# Patient Record
Sex: Male | Born: 1935 | Race: White | Hispanic: No | Marital: Married | Smoking: Former smoker
Health system: Southern US, Community
[De-identification: ages and names within clinical notes are randomized; demographics above are authoritative.]

## PROBLEM LIST (undated history)

## (undated) DIAGNOSIS — D649 Anemia, unspecified: Secondary | ICD-10-CM

## (undated) DIAGNOSIS — K8689 Other specified diseases of pancreas: Secondary | ICD-10-CM

## (undated) DIAGNOSIS — M1990 Unspecified osteoarthritis, unspecified site: Secondary | ICD-10-CM

## (undated) DIAGNOSIS — M48061 Spinal stenosis, lumbar region without neurogenic claudication: Secondary | ICD-10-CM

## (undated) DIAGNOSIS — R6510 Systemic inflammatory response syndrome (SIRS) of non-infectious origin without acute organ dysfunction: Secondary | ICD-10-CM

## (undated) DIAGNOSIS — E785 Hyperlipidemia, unspecified: Secondary | ICD-10-CM

## (undated) DIAGNOSIS — M109 Gout, unspecified: Secondary | ICD-10-CM

## (undated) DIAGNOSIS — R2689 Other abnormalities of gait and mobility: Secondary | ICD-10-CM

## (undated) DIAGNOSIS — M790 Rheumatism, unspecified: Secondary | ICD-10-CM

## (undated) DIAGNOSIS — E039 Hypothyroidism, unspecified: Secondary | ICD-10-CM

## (undated) DIAGNOSIS — Z741 Need for assistance with personal care: Secondary | ICD-10-CM

## (undated) DIAGNOSIS — K5900 Constipation, unspecified: Secondary | ICD-10-CM

## (undated) DIAGNOSIS — I10 Essential (primary) hypertension: Secondary | ICD-10-CM

## (undated) DIAGNOSIS — N200 Calculus of kidney: Secondary | ICD-10-CM

## (undated) DIAGNOSIS — H9190 Unspecified hearing loss, unspecified ear: Secondary | ICD-10-CM

## (undated) DIAGNOSIS — I4891 Unspecified atrial fibrillation: Secondary | ICD-10-CM

## (undated) DIAGNOSIS — K56609 Unspecified intestinal obstruction, unspecified as to partial versus complete obstruction: Secondary | ICD-10-CM

## (undated) HISTORY — DX: Hyperlipidemia, unspecified: E78.5

## (undated) HISTORY — PX: TRANSURETHRAL RESECTION OF PROSTATE: SHX73

## (undated) HISTORY — DX: Unspecified intestinal obstruction, unspecified as to partial versus complete obstruction: K56.609

## (undated) HISTORY — PX: CYSTOSCOPY W/ STONE MANIPULATION: SHX1427

## (undated) HISTORY — PX: NASAL FRACTURE SURGERY: SHX718

## (undated) HISTORY — DX: Anemia, unspecified: D64.9

---

## 1975-12-10 ENCOUNTER — Encounter: Payer: Self-pay | Admitting: Internal Medicine

## 1986-09-26 ENCOUNTER — Encounter: Payer: Self-pay | Admitting: Internal Medicine

## 1987-09-19 ENCOUNTER — Encounter: Payer: Self-pay | Admitting: Internal Medicine

## 1988-08-30 ENCOUNTER — Encounter: Payer: Self-pay | Admitting: Internal Medicine

## 1992-08-26 ENCOUNTER — Encounter: Payer: Self-pay | Admitting: Internal Medicine

## 1993-08-03 ENCOUNTER — Encounter: Payer: Self-pay | Admitting: Internal Medicine

## 1998-06-12 ENCOUNTER — Encounter: Payer: Self-pay | Admitting: Internal Medicine

## 1998-09-02 ENCOUNTER — Inpatient Hospital Stay (HOSPITAL_COMMUNITY): Admission: EM | Admit: 1998-09-02 | Discharge: 1998-09-08 | Payer: Self-pay | Admitting: Emergency Medicine

## 1998-09-02 ENCOUNTER — Encounter: Payer: Self-pay | Admitting: Emergency Medicine

## 1998-09-04 ENCOUNTER — Encounter: Payer: Self-pay | Admitting: Family Medicine

## 1998-09-10 ENCOUNTER — Encounter: Admission: RE | Admit: 1998-09-10 | Discharge: 1998-09-10 | Payer: Self-pay | Admitting: Family Medicine

## 2001-07-07 ENCOUNTER — Inpatient Hospital Stay (HOSPITAL_COMMUNITY): Admission: EM | Admit: 2001-07-07 | Discharge: 2001-07-08 | Payer: Self-pay | Admitting: Emergency Medicine

## 2001-07-07 ENCOUNTER — Encounter: Payer: Self-pay | Admitting: Emergency Medicine

## 2001-07-24 ENCOUNTER — Encounter: Payer: Self-pay | Admitting: Cardiology

## 2001-07-24 ENCOUNTER — Ambulatory Visit (HOSPITAL_COMMUNITY): Admission: RE | Admit: 2001-07-24 | Discharge: 2001-07-24 | Payer: Self-pay | Admitting: Cardiology

## 2001-10-03 ENCOUNTER — Encounter: Payer: Self-pay | Admitting: Internal Medicine

## 2002-08-26 ENCOUNTER — Emergency Department (HOSPITAL_COMMUNITY): Admission: EM | Admit: 2002-08-26 | Discharge: 2002-08-26 | Payer: Self-pay

## 2003-04-05 ENCOUNTER — Encounter: Admission: RE | Admit: 2003-04-05 | Discharge: 2003-04-05 | Payer: Self-pay | Admitting: Rheumatology

## 2003-04-05 ENCOUNTER — Encounter: Payer: Self-pay | Admitting: Rheumatology

## 2003-04-15 ENCOUNTER — Encounter: Payer: Self-pay | Admitting: Rheumatology

## 2003-04-15 ENCOUNTER — Encounter: Admission: RE | Admit: 2003-04-15 | Discharge: 2003-04-15 | Payer: Self-pay | Admitting: Rheumatology

## 2003-06-03 ENCOUNTER — Encounter: Payer: Self-pay | Admitting: Family Medicine

## 2003-06-03 ENCOUNTER — Encounter: Admission: RE | Admit: 2003-06-03 | Discharge: 2003-06-03 | Payer: Self-pay | Admitting: Family Medicine

## 2004-08-27 ENCOUNTER — Ambulatory Visit: Payer: Self-pay | Admitting: Family Medicine

## 2005-01-19 ENCOUNTER — Ambulatory Visit: Payer: Self-pay | Admitting: Family Medicine

## 2005-04-27 ENCOUNTER — Ambulatory Visit: Payer: Self-pay | Admitting: Family Medicine

## 2005-08-31 ENCOUNTER — Ambulatory Visit: Payer: Self-pay | Admitting: Family Medicine

## 2005-12-30 ENCOUNTER — Ambulatory Visit: Payer: Self-pay | Admitting: Family Medicine

## 2006-04-28 ENCOUNTER — Ambulatory Visit: Payer: Self-pay | Admitting: Family Medicine

## 2006-06-26 ENCOUNTER — Emergency Department (HOSPITAL_COMMUNITY): Admission: EM | Admit: 2006-06-26 | Discharge: 2006-06-26 | Payer: Self-pay | Admitting: Emergency Medicine

## 2006-09-05 ENCOUNTER — Ambulatory Visit: Payer: Self-pay | Admitting: Family Medicine

## 2008-12-02 ENCOUNTER — Encounter: Payer: Self-pay | Admitting: Internal Medicine

## 2009-03-18 ENCOUNTER — Ambulatory Visit: Payer: Self-pay | Admitting: Internal Medicine

## 2009-03-18 DIAGNOSIS — R198 Other specified symptoms and signs involving the digestive system and abdomen: Secondary | ICD-10-CM

## 2009-03-18 DIAGNOSIS — K5900 Constipation, unspecified: Secondary | ICD-10-CM | POA: Insufficient documentation

## 2009-03-18 DIAGNOSIS — E039 Hypothyroidism, unspecified: Secondary | ICD-10-CM

## 2009-03-19 LAB — CONVERTED CEMR LAB
Basophils Absolute: 0 10*3/uL (ref 0.0–0.1)
Basophils Relative: 0.6 % (ref 0.0–3.0)
Eosinophils Absolute: 0.1 10*3/uL (ref 0.0–0.7)
HCT: 37 % — ABNORMAL LOW (ref 39.0–52.0)
Hemoglobin: 12.8 g/dL — ABNORMAL LOW (ref 13.0–17.0)
Lymphocytes Relative: 24 % (ref 12.0–46.0)
Lymphs Abs: 1.3 10*3/uL (ref 0.7–4.0)
MCHC: 34.5 g/dL (ref 30.0–36.0)
MCV: 97.3 fL (ref 78.0–100.0)
Monocytes Absolute: 0.5 10*3/uL (ref 0.1–1.0)
Neutro Abs: 3.4 10*3/uL (ref 1.4–7.7)
RDW: 12.5 % (ref 11.5–14.6)

## 2009-04-13 ENCOUNTER — Emergency Department (HOSPITAL_COMMUNITY): Admission: EM | Admit: 2009-04-13 | Discharge: 2009-04-14 | Payer: Self-pay | Admitting: Emergency Medicine

## 2009-05-19 ENCOUNTER — Ambulatory Visit: Payer: Self-pay | Admitting: Internal Medicine

## 2009-05-19 DIAGNOSIS — D649 Anemia, unspecified: Secondary | ICD-10-CM | POA: Insufficient documentation

## 2009-07-21 ENCOUNTER — Emergency Department (HOSPITAL_COMMUNITY): Admission: EM | Admit: 2009-07-21 | Discharge: 2009-07-21 | Payer: Self-pay | Admitting: Emergency Medicine

## 2010-12-23 LAB — POCT CARDIAC MARKERS
CKMB, poc: 3.7 ng/mL (ref 1.0–8.0)
CKMB, poc: 6 ng/mL (ref 1.0–8.0)
Troponin i, poc: <0.05 ng/mL (ref 0.00–0.09)
Troponin i, poc: <0.05 ng/mL (ref 0.00–0.09)

## 2010-12-23 LAB — URINALYSIS, ROUTINE W REFLEX MICROSCOPIC
Bilirubin Urine: NEGATIVE
Glucose, UA: NEGATIVE mg/dL
Hgb urine dipstick: NEGATIVE
Protein, ur: NEGATIVE mg/dL
pH: 7.5 (ref 5.0–8.0)

## 2010-12-23 LAB — COMPREHENSIVE METABOLIC PANEL
ALT: 11 U/L (ref 0–53)
AST: 16 U/L (ref 0–37)
Calcium: 9.2 mg/dL (ref 8.4–10.5)
Creatinine, Ser: 1.05 mg/dL (ref 0.4–1.5)
GFR calc Af Amer: >60 mL/min (ref >60)
Sodium: 133 mEq/L — ABNORMAL LOW (ref 135–145)
Total Protein: 7 g/dL (ref 6.0–8.3)

## 2010-12-23 LAB — DIFFERENTIAL
Eosinophils Absolute: 0.1 10*3/uL (ref 0.0–0.7)
Eosinophils Relative: 1 % (ref 0–5)
Lymphocytes Relative: 17 % (ref 12–46)
Lymphs Abs: 1.1 10*3/uL (ref 0.7–4.0)
Monocytes Relative: 6 % (ref 3–12)

## 2010-12-23 LAB — CBC
MCHC: 34.9 g/dL (ref 30.0–36.0)
MCV: 95.4 fL (ref 78.0–100.0)
Platelets: 229 10*3/uL (ref 150–400)
RDW: 13 % (ref 11.5–15.5)

## 2010-12-23 LAB — LIPASE, BLOOD: Lipase: 26 U/L (ref 11–59)

## 2010-12-27 LAB — DIFFERENTIAL
Basophils Relative: 0 % (ref 0–1)
Eosinophils Absolute: 0.1 10*3/uL (ref 0.0–0.7)
Eosinophils Relative: 2 % (ref 0–5)
Lymphs Abs: 1.5 10*3/uL (ref 0.7–4.0)
Monocytes Absolute: 0.4 10*3/uL (ref 0.1–1.0)
Monocytes Relative: 7 % (ref 3–12)
Neutrophils Relative %: 63 % (ref 43–77)

## 2010-12-27 LAB — URINALYSIS, ROUTINE W REFLEX MICROSCOPIC
Bilirubin Urine: NEGATIVE
Glucose, UA: NEGATIVE mg/dL
Hgb urine dipstick: NEGATIVE
Protein, ur: NEGATIVE mg/dL

## 2010-12-27 LAB — CBC
HCT: 38.1 % — ABNORMAL LOW (ref 39.0–52.0)
Hemoglobin: 13.1 g/dL (ref 13.0–17.0)
MCHC: 34.4 g/dL (ref 30.0–36.0)
MCV: 97.4 fL (ref 78.0–100.0)
RBC: 3.91 MIL/uL — ABNORMAL LOW (ref 4.22–5.81)
WBC: 5.5 10*3/uL (ref 4.0–10.5)

## 2010-12-27 LAB — BASIC METABOLIC PANEL
CO2: 21 mEq/L (ref 19–32)
Chloride: 106 mEq/L (ref 96–112)
GFR calc Af Amer: >60 mL/min (ref >60)
Potassium: 3.9 mEq/L (ref 3.5–5.1)

## 2011-02-05 NOTE — Procedures (Signed)
[facility]  Patient:    Mario May, Mario May Visit Number: [id number] MRN: [id number]          Service Type: MED Location: [phone] Attending Physician:  Junious Silk Dictated by:   Winn Jock Smitty Cords, M.D. Proc. Date: 07/24/01 Admit Date:  07/07/2001 Discharge Date: 07/08/2001                             Procedure Report  PROCEDURE:  Adenosine stress nuclear cardiogram.  CARDIOLOGIST:  Winn Jock. Smitty Cords, M.D.  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  He has had several stress procedures in the past including an RNA, which was negative for ischemia.  He has had borderline mild hypertension.  His chest pain has been described as sometimes substernal, occasionally radiating into the left chest.  His last admission was an emergency room evaluation at [facility], at which time enzymes were negative, EKG was normal and he was discharged home.  PAST MEDICAL HISTORY:  Bilateral lower extremity pain, history of gout and otherwise mild arthritic symptoms only.  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  PHYSICAL EXAMINATION:  VITAL SIGNS:  On examination, his blood pressure at rest is 140/60, pulse 85, regular, weight 195 pounds and respirations 18. EENT/NECK:  No bruit.  Neck pulse is good.  CHEST:  Negative.  LUNGS:  Clear. HEART:  No gallop or murmur.  ABDOMEN:  Slightly obese, nontender. EXTREMITIES:  Good peripheral pulses.  NEUROLOGICAL:  Negative.  DESCRIPTION OF PROCEDURE:  Procedure was one of adenosine injection of the usual protocol with immediate flushing/anxiety for which required some reassurance.  His EKG did not show any changes.  He had rare PVC.   Transient mild tachycardia of about 110.  Following the procedure, he was asymptomatic. The 12-lead EKG was negative.  The nuclear scan is currently being processed and will be reported separately.  IMPRESSION:  Chest pain, rule out angina.  Adenosine injection complicated by flushing, anxiety and mild tachycardia, with no other complications. Dictated by:   Winn Jock Smitty Cords, M.D. Attending Physician:  Junious Silk DD:  07/24/01 TD:  07/25/01 Job: [id number]

## 2011-02-05 NOTE — Discharge Summary (Signed)
[place]  Patient:    Mario May, Mario May Visit Number: [id number] MRN: [id number]          Service Type: MED Location: [phone] Attending Physician:  Junious Silk Dictated by:   Dian Queen, P.A.C. [facility] Admit Date:  07/07/2001 Discharge Date: 07/08/2001   CC:         Dr. Fayrene Fearing C. Bruce  Dr. Lysbeth Galas in [place] at [facility]   Referring Physician Discharge [place]  HISTORY OF PRESENT ILLNESS:  Mario May is a very pleasant 77 year old white male with hypertension, hyperlipoproteinemia, history of hyperuricemia, renal stones, prior TURP who presented with atypical left precordial chest pain exacerbated by movement.  His chest was sore to the touch.  He had multiple risk factors and we felt we ought to watch him overnight, but felt this was related to musculoskeletal strain incurred when he was carrying around a pesticide container a day or so prior to this.  COURSE IN THE HOSPITAL:  Patient ruled out for myocardial infarction.  Lab work was unremarkable, electrolytes and renal function totally normal. Hemoglobin 14.1 with a hematocrit of 41.1 and a white count of 4700.  CKs were low with negative MBs and troponins were low.  Patient was watched overnight.  He had no further chest pain.  We began him on nonsteroidals.  He ruled out, had no serial evolutionary changes.  We thought it was safe to send him home with a plan to get an outpatient Cardiolite.  FINAL DIAGNOSES: 1. Chest pain - probably musculoskeletal - infarct ruled out. 2. Hyperlipoproteinemia - on Lipitor. 3. Controlled hypertension.  DISPOSITION:  Patient discharged home on same medications plus Motrin for two days.  We will be in contact with Dr. Zigmund Gottron office and suggest perhaps the arrangement of an outpatient Cardiolite but defer this ultimate decision to him. Dictated by:   Dian Queen, P.A.C. LHC Attending Physician:  Junious Silk DD:  07/08/01 TD:  07/08/01 Job: [id number]

## 2013-02-18 DIAGNOSIS — R6510 Systemic inflammatory response syndrome (SIRS) of non-infectious origin without acute organ dysfunction: Secondary | ICD-10-CM

## 2013-02-18 HISTORY — DX: Systemic inflammatory response syndrome (sirs) of non-infectious origin without acute organ dysfunction: R65.10

## 2013-03-14 ENCOUNTER — Emergency Department (HOSPITAL_COMMUNITY): Payer: Medicare PPO

## 2013-03-14 ENCOUNTER — Encounter (HOSPITAL_COMMUNITY): Payer: Self-pay | Admitting: Radiology

## 2013-03-14 ENCOUNTER — Inpatient Hospital Stay (HOSPITAL_COMMUNITY)
Admission: EM | Admit: 2013-03-14 | Discharge: 2013-03-19 | DRG: 872 | Disposition: A | Discharge status: Home Health | Payer: Medicare PPO | Attending: Family Medicine | Admitting: Family Medicine

## 2013-03-14 DIAGNOSIS — I441 Atrioventricular block, second degree: Secondary | ICD-10-CM

## 2013-03-14 DIAGNOSIS — M109 Gout, unspecified: Secondary | ICD-10-CM | POA: Diagnosis present

## 2013-03-14 DIAGNOSIS — I10 Essential (primary) hypertension: Secondary | ICD-10-CM | POA: Diagnosis present

## 2013-03-14 DIAGNOSIS — A412 Sepsis due to unspecified staphylococcus: Principal | ICD-10-CM | POA: Diagnosis present

## 2013-03-14 DIAGNOSIS — R6510 Systemic inflammatory response syndrome (SIRS) of non-infectious origin without acute organ dysfunction: Secondary | ICD-10-CM | POA: Diagnosis present

## 2013-03-14 DIAGNOSIS — I440 Atrioventricular block, first degree: Secondary | ICD-10-CM | POA: Diagnosis present

## 2013-03-14 DIAGNOSIS — R001 Bradycardia, unspecified: Secondary | ICD-10-CM

## 2013-03-14 DIAGNOSIS — N390 Urinary tract infection, site not specified: Secondary | ICD-10-CM | POA: Diagnosis present

## 2013-03-14 DIAGNOSIS — M1990 Unspecified osteoarthritis, unspecified site: Secondary | ICD-10-CM | POA: Diagnosis present

## 2013-03-14 DIAGNOSIS — Z79899 Other long term (current) drug therapy: Secondary | ICD-10-CM

## 2013-03-14 DIAGNOSIS — E039 Hypothyroidism, unspecified: Secondary | ICD-10-CM

## 2013-03-14 DIAGNOSIS — M5430 Sciatica, unspecified side: Secondary | ICD-10-CM | POA: Diagnosis present

## 2013-03-14 DIAGNOSIS — Z9181 History of falling: Secondary | ICD-10-CM

## 2013-03-14 DIAGNOSIS — A419 Sepsis, unspecified organism: Secondary | ICD-10-CM | POA: Diagnosis present

## 2013-03-14 DIAGNOSIS — Z7982 Long term (current) use of aspirin: Secondary | ICD-10-CM

## 2013-03-14 HISTORY — DX: Essential (primary) hypertension: I10

## 2013-03-14 HISTORY — DX: Hypothyroidism, unspecified: E03.9

## 2013-03-14 HISTORY — DX: Gout, unspecified: M10.9

## 2013-03-14 HISTORY — DX: Constipation, unspecified: K59.00

## 2013-03-14 LAB — CBC
HCT: 40.8 % (ref 39.0–52.0)
MCH: 32 pg (ref 26.0–34.0)
MCV: 96 fL (ref 78.0–100.0)
RDW: 13.2 % (ref 11.5–15.5)
WBC: 6.5 10*3/uL (ref 4.0–10.5)

## 2013-03-14 LAB — URINALYSIS, ROUTINE W REFLEX MICROSCOPIC
Bilirubin Urine: NEGATIVE
Glucose, UA: NEGATIVE mg/dL
Ketones, ur: NEGATIVE mg/dL
Leukocytes, UA: NEGATIVE
Nitrite: NEGATIVE
Protein, ur: 100 mg/dL — AB
Specific Gravity, Urine: 1.02 (ref 1.005–1.030)
Urobilinogen, UA: 1 mg/dL (ref 0.0–1.0)
pH: 5.5 (ref 5.0–8.0)

## 2013-03-14 LAB — COMPREHENSIVE METABOLIC PANEL
Albumin: 3.5 g/dL (ref 3.5–5.2)
BUN: 29 mg/dL — ABNORMAL HIGH (ref 6–23)
CO2: 23 mEq/L (ref 19–32)
Calcium: 9.1 mg/dL (ref 8.4–10.5)
Chloride: 100 mEq/L (ref 96–112)
Creatinine, Ser: 1.28 mg/dL (ref 0.50–1.35)
GFR calc non Af Amer: 46 mL/min — ABNORMAL LOW (ref >90)
Total Bilirubin: 0.7 mg/dL (ref 0.3–1.2)

## 2013-03-14 LAB — PROCALCITONIN: Procalcitonin: 0.28 ng/mL

## 2013-03-14 LAB — URINE MICROSCOPIC-ADD ON

## 2013-03-14 LAB — CG4 I-STAT (LACTIC ACID): Lactic Acid, Venous: 1.27 mmol/L (ref 0.5–2.2)

## 2013-03-14 MED ORDER — PIPERACILLIN-TAZOBACTAM 3.375 G IVPB
3.3750 g | Freq: Three times a day (TID) | INTRAVENOUS | Status: DC
  Administered 2013-03-15 – 2013-03-19 (×13): 3.375 g via INTRAVENOUS
  Filled 2013-03-14 (×15): qty 50

## 2013-03-14 MED ORDER — SODIUM CHLORIDE 0.9 % IV SOLN
1000.0000 mL | INTRAVENOUS | Status: DC
  Administered 2013-03-14: 1000 mL via INTRAVENOUS

## 2013-03-14 MED ORDER — VANCOMYCIN HCL IN DEXTROSE 1-5 GM/200ML-% IV SOLN: 1000.0000 mg | INTRAVENOUS | Status: DC

## 2013-03-14 MED ORDER — ACETAMINOPHEN 325 MG PO TABS
975.0000 mg | ORAL_TABLET | Freq: Once | ORAL | Status: AC
  Administered 2013-03-14: 975 mg via ORAL
  Filled 2013-03-14: qty 3

## 2013-03-14 MED ORDER — IOHEXOL 300 MG/ML  SOLN
75.0000 mL | Freq: Once | INTRAMUSCULAR | Status: AC | PRN
  Administered 2013-03-14: 75 mL via INTRAVENOUS

## 2013-03-14 MED ORDER — VANCOMYCIN HCL IN DEXTROSE 1-5 GM/200ML-% IV SOLN
1000.0000 mg | Freq: Once | INTRAVENOUS | Status: AC
  Administered 2013-03-14: 1000 mg via INTRAVENOUS
  Filled 2013-03-14: qty 200

## 2013-03-14 MED ORDER — PIPERACILLIN-TAZOBACTAM 3.375 G IVPB 30 MIN
3.3750 g | Freq: Once | INTRAVENOUS | Status: AC
  Administered 2013-03-14: 3.375 g via INTRAVENOUS
  Filled 2013-03-14: qty 50

## 2013-03-14 MED ORDER — VANCOMYCIN HCL 10 G IV SOLR
1250.0000 mg | INTRAVENOUS | Status: DC
  Administered 2013-03-15 – 2013-03-16 (×2): 1250 mg via INTRAVENOUS
  Filled 2013-03-14 (×3): qty 1250

## 2013-03-14 MED ORDER — SODIUM CHLORIDE 0.9 % IV SOLN
1000.0000 mL | Freq: Once | INTRAVENOUS | Status: AC
  Administered 2013-03-14: 1000 mL via INTRAVENOUS

## 2013-03-14 NOTE — ED Notes (Addendum)
Pt to ED via EMS for evaluation of weakness and N/V.  Pt was seen at [facility] today for similar symptoms and told he had a UTI, given prescription for antibiotic.  When pt got home he began to feel nauseas and dizzy- called EMS.  Pt complaining of generalized weakness and pain behind his left ear.  Alert and oriented upon arrival to ED.

## 2013-03-14 NOTE — ED Notes (Signed)
Pt returned from xray

## 2013-03-14 NOTE — Progress Notes (Signed)
ANTIBIOTIC CONSULT NOTE - INITIAL  Pharmacy Consult for Zosyn, Vancomycin Indication: rule out sepsis  Allergies  Allergen Reactions  . Codeine     Patient Measurements: Height: 5' 10.87" (180 cm) Weight: 186 lb 1.1 oz (84.4 kg) IBW/kg (Calculated) : 74.99 Adjusted Body Weight:   Vital Signs: Temp: 102.7 F (39.3 C) (06/25 2153) Temp src: Rectal (06/25 2153) BP: 118/69 mmHg (06/25 2243) Pulse Rate: 63 (06/25 2243) Intake/Output from previous day:   Intake/Output from this shift:    Labs:  Recent Labs  03/14/13 2012  WBC 6.5  HGB 13.6  PLT 167  CREATININE 1.28   Estimated Creatinine Clearance: 36.6 ml/min (by C-G formula based on Cr of 1.28). No results found for this basename: VANCOTROUGH, VANCOPEAK, VANCORANDOM, GENTTROUGH, GENTPEAK, GENTRANDOM, TOBRATROUGH, TOBRAPEAK, TOBRARND, AMIKACINPEAK, AMIKACINTROU, AMIKACIN,  in the last 72 hours   Microbiology: No results found for this or any previous visit (from the past 720 hour(s)).  Medical History: No past medical history on file.  Medications:  Scheduled:   Assessment: 79 yr old male who awoke this morning feeling weak and unable to walk. He was seen at [facility] and given Bactrim for a UTI. When pt got home, he became weak and nauseated so he called EMS to come [facility]. At this point he has no definite diagnosis.  Goal of Therapy:  Vancomycin trough level 15-20 mcg/ml  Plan:  Zosyn 3.375 GM IV q8h Vancomycin 1250 mg IV q12hrs.  Eugene Garnet 03/14/2013,10:50 PM

## 2013-03-14 NOTE — ED Notes (Signed)
Pt returned from CT °

## 2013-03-14 NOTE — ED Notes (Signed)
Pt remains in CT at this time.  

## 2013-03-14 NOTE — ED Provider Notes (Addendum)
History    CSN: [id number] Arrival date & time 03/14/13  1846  First MD Initiated Contact with Patient 03/14/13 1903     Chief Complaint  Patient presents with  . Weakness   (Consider location/radiation/quality/duration/timing/severity/associated sxs/prior Treatment) HPI  complains of generalized weakness onset this morning. Patient states was too weak to walk. He also complains of slight pain behind left ear and dysuria which is chronic. Seen by urgent [facility] this morning diagnosed with dysuria and muscle strain prescribed Bactrim. Presents here as weakness has increased. He denies cough denies chest pain denies other complaint No past medical history on file. No past surgical history on file. No family history on file. past medical history prostate hypertrophy History  Substance Use Topics  . Smoking status: Not on file  . Smokeless tobacco: Not on file  . Alcohol Use: Not on file   no tobacco no alcohol no drugs  Review of Systems  Constitutional: Negative.   HENT: Positive for hearing loss.        Chronically hard of hearing  Respiratory: Negative.   Cardiovascular: Negative.   Gastrointestinal: Negative.   Genitourinary: Positive for dysuria.  Musculoskeletal: Negative.   Skin: Negative.   Neurological: Negative.   Psychiatric/Behavioral: Negative.   All other systems reviewed and are negative.    Allergies  Codeine  Home Medications  No current outpatient prescriptions on file. BP 174/79  Pulse 78  Temp(Src) 103.2 F (39.6 C) (Rectal)  Resp 18  SpO2 99% Physical Exam  Nursing note and vitals reviewed. Constitutional: He appears well-developed and well-nourished. He appears distressed.  Moderately ill-appearing  HENT:  Head: Normocephalic and atraumatic.  Right Ear: External ear normal.  Left Ear: External ear normal.  Nose: Nose normal.  . Left ear, hearing A. removed, bilateral tympanic mucus membranes normal no redness or tenderness  of the mastoid process bilaterally  Eyes: Conjunctivae are normal. Pupils are equal, round, and reactive to light.  Neck: Neck supple. No tracheal deviation present. No thyromegaly present.  No signs of meningitis  Cardiovascular: Normal rate and regular rhythm.   No murmur heard. Pulmonary/Chest: Effort normal and breath sounds normal.  Abdominal: Soft. Bowel sounds are normal. He exhibits no distension. There is no tenderness.  Genitourinary:  External hemorrhoid. No perirectal abscess.  Musculoskeletal: Normal range of motion. He exhibits no edema and no tenderness.  Lymphadenopathy:    He has no cervical adenopathy.  Neurological: He is alert. No cranial nerve deficit. Coordination normal.  Skin: Skin is warm and dry. No rash noted.  Psychiatric: He has a normal mood and affect.   Results for orders placed during the hospital encounter of 03/14/13  CBC      Result Value Range   WBC 6.5  4.0 - 10.5 K/uL   RBC 4.25  4.22 - 5.81 MIL/uL   Hemoglobin 13.6  13.0 - 17.0 g/dL   HCT 75.6  43.3 - 29.5 %   MCV 96.0  78.0 - 100.0 fL   MCH 32.0  26.0 - 34.0 pg   MCHC 33.3  30.0 - 36.0 g/dL   RDW 18.8  41.6 - 60.6 %   Platelets 167  150 - 400 K/uL  COMPREHENSIVE METABOLIC PANEL      Result Value Range   Sodium 135  135 - 145 mEq/L   Potassium 4.0  3.5 - 5.1 mEq/L   Chloride 100  96 - 112 mEq/L   CO2 23  19 - 32 mEq/L  Glucose, Bld 117 (*) 70 - 99 mg/dL   BUN 29 (*) 6 - 23 mg/dL   Creatinine, Ser 1.61  0.50 - 1.35 mg/dL   Calcium 9.1  8.4 - 09.6 mg/dL   Total Protein 8.0  6.0 - 8.3 g/dL   Albumin 3.5  3.5 - 5.2 g/dL   AST 29  0 - 37 U/L   ALT 9  0 - 53 U/L   Alkaline Phosphatase 128 (*) 39 - 117 U/L   Total Bilirubin 0.7  0.3 - 1.2 mg/dL   GFR calc non Af Amer 46 (*) >90 mL/min   GFR calc Af Amer 53 (*) >90 mL/min  PROCALCITONIN      Result Value Range   Procalcitonin 0.28    URINALYSIS, ROUTINE W REFLEX MICROSCOPIC      Result Value Range   Color, Urine YELLOW  YELLOW    APPearance CLEAR  CLEAR   Specific Gravity, Urine 1.020  1.005 - 1.030   pH 5.5  5.0 - 8.0   Glucose, UA NEGATIVE  NEGATIVE mg/dL   Hgb urine dipstick SMALL (*) NEGATIVE   Bilirubin Urine NEGATIVE  NEGATIVE   Ketones, ur NEGATIVE  NEGATIVE mg/dL   Protein, ur 045 (*) NEGATIVE mg/dL   Urobilinogen, UA 1.0  0.0 - 1.0 mg/dL   Nitrite NEGATIVE  NEGATIVE   Leukocytes, UA NEGATIVE  NEGATIVE  URINE MICROSCOPIC-ADD ON      Result Value Range   Squamous Epithelial / LPF RARE  RARE   WBC, UA 0-2  <3 WBC/hpf   Urine-Other MUCOUS PRESENT    CG4 I-STAT (LACTIC ACID)      Result Value Range   Lactic Acid, Venous 1.27  0.5 - 2.2 mmol/L   Dg Chest 2 View  03/14/2013   *RADIOLOGY REPORT*  Clinical Data: Weakness  CHEST - 2 VIEW  Comparison: 07/21/2009  Findings: Mild cardiomegaly without CHF or pneumonia.  Minimal basilar atelectasis versus scarring.  No effusion or pneumothorax. Atherosclerotic calcification of the aorta.  Trachea is midline. Degenerative changes of the spine.  IMPRESSION: Cardiomegaly without CHF or pneumonia   Original Report Authenticated By: Judie Petit. Miles Costain, M.D.   Ct Soft Tissue Neck Wo Contrast  03/14/2013   *RADIOLOGY REPORT*  Clinical Data:  Pain behind left ear  CT NECK WITH CONTRAST  Technique:  Multidetector CT imaging of the neck was performed using the standard protocol following the bolus administration of intravenous contrast.  Contrast: 75mL OMNIPAQUE IOHEXOL 300 MG/ML  SOLN  Comparison:   None  Findings: Majority of the mastoid sinus is visualized and is clear bilaterally.  External auditory canal is patent.  No bony lesion in the skull base.  Negative for mass or adenopathy in the neck.  No evidence of abscess.  The tonsils are normal.  The tongue is normal. Epiglottis and larynx are normal.  Parotid and submandibular glands are normal bilaterally.  Cervical disc degeneration and facet degeneration diffusely.  No acute bony abnormality.  IMPRESSION: No acute abnormality.    Original Report Authenticated By: Janeece Riggers, M.D.    Chest xray viewed   by me ED Course  Procedures (including critical care time) Labs Reviewed  CBC  COMPREHENSIVE METABOLIC PANEL   40:98 PM patient feels improved after treatment with intravenous fluids, antibiotics and Tylenol No results found. No diagnosis found. MDM  Code sepsis called based on Sirs criteria of a respiratory rate and temperature and suspected infection. CT scan of neck ordered to rule out mastoiditis  as patient complains of pain over the left mastoid area. No contrast administered as patient has low GFR Records from urgent care center received by fax and reviewed. Had lengthy discussion with the family of patient with his permission to discuss need for admission. Spoke with Dr.Garnder. Plan admit telmetry Dx Sepsis CRITICAL CARE Performed by: Doug Sou Total critical care time: 30 minute Critical care time was exclusive of separately billable procedures and treating other patients. Critical care was necessary to treat or prevent imminent or life-threatening deterioration. Critical care was time spent personally by me on the following activities: development of treatment plan with patient and/or surrogate as well as nursing, discussions with consultants, evaluation of patient's response to treatment, examination of patient, obtaining history from patient or surrogate, ordering and performing treatments and interventions, ordering and review of laboratory studies, ordering and review of radiographic studies, pulse oximetry and re-evaluation of patient's condition.  Doug Sou, MD 03/15/13 0000  Doug Sou, MD 03/15/13 1610

## 2013-03-14 NOTE — ED Notes (Signed)
This RN made x3 attemps to draw 2nd set blood cultures. 2nd RN to attempt.

## 2013-03-15 ENCOUNTER — Encounter (HOSPITAL_COMMUNITY): Payer: Self-pay | Admitting: Internal Medicine

## 2013-03-15 DIAGNOSIS — A419 Sepsis, unspecified organism: Secondary | ICD-10-CM

## 2013-03-15 DIAGNOSIS — E039 Hypothyroidism, unspecified: Secondary | ICD-10-CM

## 2013-03-15 DIAGNOSIS — R6510 Systemic inflammatory response syndrome (SIRS) of non-infectious origin without acute organ dysfunction: Secondary | ICD-10-CM | POA: Diagnosis present

## 2013-03-15 DIAGNOSIS — I10 Essential (primary) hypertension: Secondary | ICD-10-CM

## 2013-03-15 LAB — BASIC METABOLIC PANEL
BUN: 25 mg/dL — ABNORMAL HIGH (ref 6–23)
Calcium: 8 mg/dL — ABNORMAL LOW (ref 8.4–10.5)
Creatinine, Ser: 1.19 mg/dL (ref 0.50–1.35)
GFR calc Af Amer: 58 mL/min — ABNORMAL LOW (ref >90)
GFR calc non Af Amer: 50 mL/min — ABNORMAL LOW (ref >90)
Glucose, Bld: 122 mg/dL — ABNORMAL HIGH (ref 70–99)

## 2013-03-15 LAB — CBC
HCT: 36.8 % — ABNORMAL LOW (ref 39.0–52.0)
Hemoglobin: 12.4 g/dL — ABNORMAL LOW (ref 13.0–17.0)
MCH: 31.9 pg (ref 26.0–34.0)
MCHC: 33.7 g/dL (ref 30.0–36.0)
RDW: 13.1 % (ref 11.5–15.5)

## 2013-03-15 MED ORDER — SODIUM CHLORIDE 0.9 % IJ SOLN
3.0000 mL | Freq: Two times a day (BID) | INTRAMUSCULAR | Status: DC
  Administered 2013-03-15 – 2013-03-18 (×6): 3 mL via INTRAVENOUS

## 2013-03-15 MED ORDER — MECLIZINE HCL 25 MG PO TABS
25.0000 mg | ORAL_TABLET | Freq: Every day | ORAL | Status: DC | PRN
  Filled 2013-03-15: qty 1

## 2013-03-15 MED ORDER — TRAMADOL HCL 50 MG PO TABS
50.0000 mg | ORAL_TABLET | Freq: Four times a day (QID) | ORAL | Status: DC
  Administered 2013-03-15 – 2013-03-19 (×13): 50 mg via ORAL
  Filled 2013-03-15 (×13): qty 1

## 2013-03-15 MED ORDER — HEPARIN SODIUM (PORCINE) 5000 UNIT/ML IJ SOLN
5000.0000 [IU] | Freq: Three times a day (TID) | INTRAMUSCULAR | Status: DC
  Administered 2013-03-15 – 2013-03-19 (×13): 5000 [IU] via SUBCUTANEOUS
  Filled 2013-03-15 (×16): qty 1

## 2013-03-15 MED ORDER — ALLOPURINOL 100 MG PO TABS
100.0000 mg | ORAL_TABLET | Freq: Every day | ORAL | Status: DC
  Administered 2013-03-15 – 2013-03-16 (×2): 100 mg via ORAL
  Filled 2013-03-15 (×2): qty 1

## 2013-03-15 MED ORDER — ISOSORBIDE DINITRATE 10 MG PO TABS
10.0000 mg | ORAL_TABLET | Freq: Two times a day (BID) | ORAL | Status: DC
  Administered 2013-03-15: 10 mg via ORAL
  Filled 2013-03-15 (×3): qty 1

## 2013-03-15 MED ORDER — ACETAMINOPHEN 325 MG PO TABS
975.0000 mg | ORAL_TABLET | Freq: Once | ORAL | Status: AC
  Administered 2013-03-15: 975 mg via ORAL
  Filled 2013-03-15: qty 3

## 2013-03-15 MED ORDER — KETOROLAC TROMETHAMINE 15 MG/ML IJ SOLN
15.0000 mg | Freq: Once | INTRAMUSCULAR | Status: AC
  Administered 2013-03-15: 15 mg via INTRAVENOUS
  Filled 2013-03-15: qty 1

## 2013-03-15 MED ORDER — ATENOLOL 12.5 MG HALF TABLET
12.5000 mg | ORAL_TABLET | Freq: Every day | ORAL | Status: DC
  Filled 2013-03-15 (×2): qty 1

## 2013-03-15 MED ORDER — ACETAMINOPHEN 325 MG PO TABS: 650.0000 mg | ORAL_TABLET | Freq: Four times a day (QID) | ORAL | Status: DC | PRN

## 2013-03-15 MED ORDER — ASPIRIN 325 MG PO TABS
325.0000 mg | ORAL_TABLET | Freq: Every day | ORAL | Status: DC
  Administered 2013-03-15 – 2013-03-19 (×5): 325 mg via ORAL
  Filled 2013-03-15 (×5): qty 1

## 2013-03-15 NOTE — Progress Notes (Signed)
HR brady to 33, non sustained; HR now up to 54; pt with occasional 1 sec pauses; MD paged to make aware; will cont. To monitor; pt asymptomatic.

## 2013-03-15 NOTE — Progress Notes (Signed)
9:17 AM I agree with HPI/GPe and A/P per Dr. Julian Reil      Patient admitted overnight with L Ear pain behind ear dysuria.  He right now has a lot more R hip pain > Ear or neck pain.  He states that this is probably because he is in the Bed and hasn't moved around.   Usually he is very mobile with a cane and even drives and is pretty ambulant at baseline.   He doesn't c/o much neck pain currently  SKIN/MUSCULAR-Neer, hawkins, speed and obrian test neg.  NO MSK issues noted.  No tenderness tot he L neck as yet.  Patient Active Problem List   Diagnosis Date Noted  . SIRS (systemic inflammatory response syndrome) 03/15/2013  . HTN (hypertension) 03/15/2013  . HYPOTHYROIDISM 03/18/2009  . CONSTIPATION 03/18/2009  . CHANGE IN BOWELS 03/18/2009   H/o renal stones, htn, sbo-has had TURP Evaluate din ED 07/30/09 with Consitpation/diarrhea and potential transient volvulus, dizzyness Admission 07/07/01 for CP r/o with neg Adenosine stress    Gen pain-likely Osteoarthritis-patient relatively high functioning at home  Fever-unclear etiology-await UC/BC and reassess.   Will need to continue broad-spectrum antibiotics until we get a source  Ear pain-unlikely infectious cause. Differential diagnosis could include impacted cerumen-will examine in the morning if this recurs  Pleas Koch, MD [facility] [phone]

## 2013-03-15 NOTE — Progress Notes (Signed)
Pt c/o increased pain in L ear; family at bedside; daughter states that Tylenol did not help the pain while pt was in ER; MD paged to make aware; pt with chills this AM; no temp at this time; will cont. To monitor.

## 2013-03-15 NOTE — Progress Notes (Signed)
Pt's family requesting IV Toradol again; MD paged to make aware; will cont. To monitor.

## 2013-03-15 NOTE — Progress Notes (Signed)
MD paged again at this time for new pain med orders; family at bedside very concerned about pain pt is having; will await callback.

## 2013-03-15 NOTE — ED Notes (Signed)
Dr. Gardner at bedside 

## 2013-03-15 NOTE — Progress Notes (Signed)
UR Completed.  Mario May Jane [phone] 03/15/2013  

## 2013-03-15 NOTE — Progress Notes (Signed)
Notified by RN about continued pauses.  Patient on Isordil as well as atenolol.   I have d/c both of these and spoken with cardiology PA Dorthula Perfect.  He concurs and will ask EP to see patient in am to help determine best course of action  Pleas Koch, MD [facility] [phone]

## 2013-03-15 NOTE — Progress Notes (Signed)
Pt having blocked PAC's; MD made aware; will cont. To monitor.

## 2013-03-15 NOTE — ED Notes (Signed)
Adriane RN updated on temp and Tylenol administration

## 2013-03-15 NOTE — Progress Notes (Signed)
MD returned page; new orders for IV toradol; will administer when available from pharmacy; will cont. To monitor.

## 2013-03-15 NOTE — H&P (Signed)
[facility] History and Physical  Mario May ZOX:[id number] DOB: 01/13/1915 DOA: 03/14/2013  Referring physician: ED PCP: [name], MD   Chief Complaint: Fever, Weakness  HPI: Mario May is a 79 y.o. male who presents to the ED with c/o fever and weakness.  His symptoms onset earlier this morning, due to the generalized weakness he was too week to walk.  He notes pain behind his left ear and other than that some chronic dysuria but otherwise no localizing findings to indicate the source of his fever.  He was prescribed bactrim this morning at a UC but his weakness and fever continued to increase so he came to the ED.  Work up in the ED was negative for the source, no UTI was found and CT of his neck didn't reveal the source either.  He was empirically given zosyn and vanc and hospitalist asked to admit for SIRS.  Review of Systems: No cough, no SOB, no chest pain, no flank pain, no headache, no back pain, some leg cramps 12 systems reviewed and otherwise negative.   Past Medical History  Diagnosis Date  . Hypothyroidism   . Gout   . Constipation   . Hypertension    Past Surgical History  Procedure Laterality Date  . None     Social History:  has no tobacco, alcohol, and drug history on file.  Allergies  Allergen Reactions  . Codeine     No family history on file.  Prior to Admission medications   Medication Sig Start Date End Date Taking? Authorizing Provider  allopurinol (ZYLOPRIM) 100 MG tablet Take 100 mg by mouth daily.   Yes Historical Provider, MD  aspirin 325 MG tablet Take 325 mg by mouth daily.   Yes Historical Provider, MD  atenolol (TENORMIN) 25 MG tablet Take 12.5 mg by mouth at bedtime.   Yes Historical Provider, MD  isosorbide dinitrate (ISORDIL) 10 MG tablet Take 10 mg by mouth 2 (two) times daily.   Yes Historical Provider, MD  meclizine (ANTIVERT) 25 MG tablet Take 25 mg by mouth daily as needed for dizziness or nausea.   Yes  Historical Provider, MD  sulfamethoxazole-trimethoprim (BACTRIM DS) 800-160 MG per tablet Take 1 tablet by mouth 2 (two) times daily. For 10 days; Start date 03/14/13   Yes Historical Provider, MD   Physical Exam: Filed Vitals:   03/15/13 0015 03/15/13 0018 03/15/13 0030 03/15/13 0147  BP: 152/86 152/86 156/84 174/73  Pulse: 88 81 95 79  Temp:    102.6 F (39.2 C)  TempSrc:    Oral  Resp: 26 26 23 22   Height:      Weight:    88.2 kg (194 lb 7.1 oz)  SpO2: 96% 97% 97% 97%    General:  NAD, resting comfortably in bed Eyes: PEERLA EOMI ENT: mucous membranes moist, not able to visualize tympanic membranes due to ear wax in way, hard of hearing Neck: supple w/o JVD Cardiovascular: RRR w/o MRG Respiratory: CTA B Abdomen: soft, nt, nd, bs+ Skin: no rash nor lesion Musculoskeletal: MAE, full ROM all 4 extremities Psychiatric: normal tone and affect Neurologic: AAOx3, grossly non-focal  Labs on Admission:  Basic Metabolic Panel:  Recent Labs Lab 03/14/13 2012  NA 135  K 4.0  CL 100  CO2 23  GLUCOSE 117*  BUN 29*  CREATININE 1.28  CALCIUM 9.1   Liver Function Tests:  Recent Labs Lab 03/14/13 2012  AST 29  ALT 9  ALKPHOS 128*  BILITOT 0.7  PROT 8.0  ALBUMIN 3.5   No results found for this basename: LIPASE, AMYLASE,  in the last 168 hours No results found for this basename: AMMONIA,  in the last 168 hours CBC:  Recent Labs Lab 03/14/13 2012  WBC 6.5  HGB 13.6  HCT 40.8  MCV 96.0  PLT 167   Cardiac Enzymes: No results found for this basename: CKTOTAL, CKMB, CKMBINDEX, TROPONINI,  in the last 168 hours  BNP (last 3 results) No results found for this basename: PROBNP,  in the last 8760 hours CBG: No results found for this basename: GLUCAP,  in the last 168 hours  Radiological Exams on Admission: Dg Chest 2 View  03/14/2013   *RADIOLOGY REPORT*  Clinical Data: Weakness  CHEST - 2 VIEW  Comparison: 07/21/2009  Findings: Mild cardiomegaly without CHF or  pneumonia.  Minimal basilar atelectasis versus scarring.  No effusion or pneumothorax. Atherosclerotic calcification of the aorta.  Trachea is midline. Degenerative changes of the spine.  IMPRESSION: Cardiomegaly without CHF or pneumonia   Original Report Authenticated By: Judie Petit. Miles Costain, M.D.   Ct Soft Tissue Neck Wo Contrast  03/14/2013   *RADIOLOGY REPORT*  Clinical Data:  Pain behind left ear  CT NECK WITH CONTRAST  Technique:  Multidetector CT imaging of the neck was performed using the standard protocol following the bolus administration of intravenous contrast.  Contrast: 75mL OMNIPAQUE IOHEXOL 300 MG/ML  SOLN  Comparison:   None  Findings: Majority of the mastoid sinus is visualized and is clear bilaterally.  External auditory canal is patent.  No bony lesion in the skull base.  Negative for mass or adenopathy in the neck.  No evidence of abscess.  The tonsils are normal.  The tongue is normal. Epiglottis and larynx are normal.  Parotid and submandibular glands are normal bilaterally.  Cervical disc degeneration and facet degeneration diffusely.  No acute bony abnormality.  IMPRESSION: No acute abnormality.   Original Report Authenticated By: Janeece Riggers, M.D.    EKG: Independently reviewed.  Assessment/Plan Principal Problem:   SIRS (systemic inflammatory response syndrome) Active Problems:   HYPOTHYROIDISM   HTN (hypertension)   1. SIRS - unclear source of infection but patient running fever as high as 103.2 in ED.  Empiric treatment with zosyn and vancomycin started, tylenol for fever, is certainly not volume depleted as his BP is in the 170s and he is not tachycardic so will hold off on giving additional IVF at this point in time.  Blood and urine cultures obtained.  Tele monitor. 2. HTN - continue home meds 3. H/o hypothyroidism - dont see that he is on any thyroid meds, will check a TSH   Code Status: Full Code (must indicate code status--if unknown or must be presumed, indicate  so) Family Communication: No family in room (indicate person spoken with, if applicable, with phone number if by telephone) Disposition Plan: Admit to inpatient (indicate anticipated LOS)  Time spent: 70 min  Saunders Arlington M. [facility] Pager [phone]  If 7PM-7AM, please contact night-coverage [email or web address] Password [facility] 03/15/2013, 2:35 AM

## 2013-03-16 DIAGNOSIS — I498 Other specified cardiac arrhythmias: Secondary | ICD-10-CM

## 2013-03-16 DIAGNOSIS — I441 Atrioventricular block, second degree: Secondary | ICD-10-CM

## 2013-03-16 LAB — CBC
MCH: 31.7 pg (ref 26.0–34.0)
MCHC: 33.5 g/dL (ref 30.0–36.0)
Platelets: 141 10*3/uL — ABNORMAL LOW (ref 150–400)
RBC: 3.72 MIL/uL — ABNORMAL LOW (ref 4.22–5.81)

## 2013-03-16 LAB — COMPREHENSIVE METABOLIC PANEL
ALT: 16 U/L (ref 0–53)
AST: 41 U/L — ABNORMAL HIGH (ref 0–37)
Albumin: 2.7 g/dL — ABNORMAL LOW (ref 3.5–5.2)
Calcium: 8.1 mg/dL — ABNORMAL LOW (ref 8.4–10.5)
Potassium: 3.7 mEq/L (ref 3.5–5.1)
Sodium: 133 mEq/L — ABNORMAL LOW (ref 135–145)
Total Protein: 6.5 g/dL (ref 6.0–8.3)

## 2013-03-16 MED ORDER — COLCHICINE 0.6 MG PO TABS
0.6000 mg | ORAL_TABLET | Freq: Two times a day (BID) | ORAL | Status: DC
  Administered 2013-03-16 – 2013-03-19 (×7): 0.6 mg via ORAL
  Filled 2013-03-16 (×8): qty 1

## 2013-03-16 MED ORDER — ISOSORB DINITRATE-HYDRALAZINE 20-37.5 MG PO TABS
1.0000 | ORAL_TABLET | Freq: Two times a day (BID) | ORAL | Status: DC
  Administered 2013-03-16 – 2013-03-17 (×3): 1 via ORAL
  Filled 2013-03-16 (×5): qty 1

## 2013-03-16 NOTE — Care Management Note (Signed)
    Page 1 of 2   03/19/2013     4:23:48 PM   CARE MANAGEMENT NOTE 03/19/2013  Patient:  Mario May, Mario May   Account Number:  [id number]  Date Initiated:  03/16/2013  Documentation initiated by:  Bereket Gernert  Subjective/Objective Assessment:   PT ADM ON 03/14/13 WITH BRADYCARDIA, UTI, EAR PAIN.  PTA, PT RESIDES AT HOME WITH SPOUSE.  HE HAS SUPPORTIVE DAUGHTER.     Action/Plan:   WILL FOLLOW FOR HOME NEEDS AS PT PROGRESSES.   Anticipated DC Date:  03/19/2013   Anticipated DC Plan:  HOME W HOME HEALTH SERVICES      DC Planning Services  CM consult      [facility] Choice  HOME HEALTH   Choice offered to / List presented to:  C-1 Patient   DME arranged  WALKER - ROLLING  BEDSIDE COMMODE      DME agency  [facility]     HH arranged  HH-2 PT  HH-3 OT      [facility] agency  [facility]   Status of service:  Completed, signed off Medicare Important Message given?   (If response is "NO", the following Medicare IM given date fields will be blank) Date Medicare IM given:   Date Additional Medicare IM given:    Discharge Disposition:  HOME W HOME HEALTH SERVICES  Per UR Regulation:  Reviewed for med. necessity/level of care/duration of stay  If discussed at Long Length of Stay Meetings, dates discussed:    Comments:  03/19/13 Sona Nations,RN,BSN [phone] REFERRAL TO [facility], PER PT/DTR CHOICE FOR HOME CARE.  PER INSURANCE CONTRACT, WILL NEED TO ORDER DME FROM [employer]. [facility] WILL DELIVER EQUIPMENT BEFORE 12PM TODAY.  03/16/13 Biff Rutigliano,RN,BSN [phone] PT/OT RECOMMENDING HH FOLLOW UP AND RW FOR HOME.  MD PLEASE LEAVE ORDERS IF YOU AGREE.  THANKS.

## 2013-03-16 NOTE — Evaluation (Signed)
Physical Therapy Evaluation Patient Details Name: Mario May MRN: [id number] DOB: 23-Jan-1915 Today's Date: 03/16/2013 Time: 1400-1420 PT Time Calculation (min): 20 min  PT Assessment / Plan / Recommendation History of Present Illness  Mario May is a 79 y.o. male who presents to the ED with c/o fever and weakness.  He has been diagnosed with SIRS and HTN.    Clinical Impression  Pt will benefit from acute PT services to improve overall mobility and prepare for safe d/c home with family.    PT Assessment  Patient needs continued PT services    Follow Up Recommendations  Home health PT;Supervision/Assistance - 24 hour    Equipment Recommendations  Rolling walker with 5" wheels    Frequency Min 3X/week    Precautions / Restrictions Precautions Precautions: Fall Restrictions Weight Bearing Restrictions: No   Pertinent Vitals/Pain No c/o pain      Mobility  Bed Mobility Bed Mobility: Not assessed Transfers Transfers: Sit to Stand;Stand to Sit Sit to Stand: 4: Min assist;With upper extremity assist;With armrests;From toilet Stand to Sit: 4: Min assist;With armrests;With upper extremity assist;To toilet Details for Transfer Assistance: (A) to initaite transfer and slowly descend to recliner with cues for hand placement Ambulation/Gait Ambulation/Gait Assistance: 4: Min assist Ambulation Distance (Feet): 40 Feet Assistive device: Rolling walker Ambulation/Gait Assistance Details: (A) to maintain balance with cues for proper step sequence and body position within RW Gait Pattern: Step-through pattern;Decreased stride length;Shuffle Stairs: No Wheelchair Mobility Wheelchair Mobility: No    Exercises     PT Diagnosis: Difficulty walking;Generalized weakness  PT Problem List: Decreased strength;Decreased activity tolerance;Decreased balance;Decreased mobility;Decreased knowledge of use of DME PT Treatment Interventions: DME instruction;Gait training;Stair  training;Functional mobility training;Therapeutic activities;Therapeutic exercise;Patient/family education     PT Goals(Current goals can be found in the care plan section) Acute Rehab PT Goals Patient Stated Goal: Pt wants to be as independent as possible. PT Goal Formulation: With patient/family Time For Goal Achievement: 03/23/13 Potential to Achieve Goals: Good  Visit Information  Last PT Received On: 03/16/13 Assistance Needed: +1 History of Present Illness: Mario May is a 79 y.o. male who presents to the ED with c/o fever and weakness.  He has been diagnosed with SIRS and HTN.         Prior Functioning  Home Living Family/patient expects to be discharged to:: Private residence Living Arrangements: Spouse/significant other Available Help at Discharge: Family Type of Home: House Home Access: Stairs to enter Secretary/administrator of Steps: 2 Entrance Stairs-Rails: None Home Layout: One level Home Equipment: Cane - single point;Grab bars - tub/shower Prior Function Level of Independence: Independent with assistive device(s) Communication Communication: HOH Dominant Hand: Right    Cognition  Cognition Arousal/Alertness: Awake/alert Behavior During Therapy: WFL for tasks assessed/performed Overall Cognitive Status: Within Functional Limits for tasks assessed    Extremity/Trunk Assessment Upper Extremity Assessment Upper Extremity Assessment: Generalized weakness Lower Extremity Assessment Lower Extremity Assessment: Generalized weakness Cervical / Trunk Assessment Cervical / Trunk Assessment: Normal   Balance Balance Balance Assessed: Yes Static Standing Balance Static Standing - Balance Support: No upper extremity supported Static Standing - Level of Assistance: 5: Stand by assistance;4: Min assist High Level Balance High Level Balance Comments: Pt with frequent LOB with initial standing and no UE support.  End of Session PT - End of Session Equipment  Utilized During Treatment: Gait belt Activity Tolerance: Patient tolerated treatment well Patient left: in chair;with call bell/phone within reach Nurse Communication: Mobility status  GP  [name] 03/16/2013, 3:00 PM Jake Shark, PT DPT [phone]

## 2013-03-16 NOTE — Progress Notes (Signed)
PROGRESS NOTE  Mario May ZOX:[id number] DOB: 10-Oct-1914 DOA: 03/14/2013 PCP: [name], MD  Brief narrative: 79 year male admitted 626 with left ear pain, dysuria and sent home from ED only to return with worsening fever or and weakness. Known to have history of gout.  Past medical history-As per Problem list  Consultants:  Cardiology-EP  Procedures:  Chest x-ray two-view 6/25 equal normal   CT neck 6/25 normal  Antibiotics:  Vancomycin 6/25  Zosyn 6/25   Subjective  Feels fair. He still has significant right hip pain, left auricular/perirectal pain. No nausea no vomiting no chest pain currently No dysuria Tolerating diet fairly well   Objective    Interim History:3 Ot rec=min assist for simulated selfcare tasks and functional transfers. Demonstrates high fall risk and will need use of RW at discharge as well as 24 hour supervision for safety. Will benefit from acute care OT to increase overall independence and progress to a supervision level. Recommend tub bench and 3:1 as well and HHOT to continue progression. Family plans to help arrange 24 hour supervision.    Telemetry: Frequent pauses of 2.5 seconds, sinus bradycardia  Objective: Filed Vitals:   03/15/13 1629 03/15/13 2027 03/16/13 0415 03/16/13 0721  BP: 110/50 155/80 177/75 160/80  Pulse: 44 55 54   Temp:  97.8 F (36.6 C) 99.3 F (37.4 C)   TempSrc:  Oral Oral   Resp:      Height:      Weight:      SpO2:  99% 97%     Intake/Output Summary (Last 24 hours) at 03/16/13 1100 Last data filed at 03/15/13 1347  Gross per 24 hour  Intake    290 ml  Output      0 ml  Net    290 ml    Exam:  General: Alert pleasant oriented no apparent distress Cardiovascular: S1-S2 bradycardic Respiratory: Clinically clear Abdomen: Soft nontender nondistended  Data Reviewed: Basic Metabolic Panel:  Recent Labs Lab 03/14/13 2012 03/15/13 0400 03/16/13 0645  NA 135 133* 133*  K 4.0 3.6  3.7  CL 100 102 104  CO2 23 20 20   GLUCOSE 117* 122* 101*  BUN 29* 25* 27*  CREATININE 1.28 1.19 1.36*  CALCIUM 9.1 8.0* 8.1*   Liver Function Tests:  Recent Labs Lab 03/14/13 2012 03/16/13 0645  AST 29 41*  ALT 9 16  ALKPHOS 128* 128*  BILITOT 0.7 0.7  PROT 8.0 6.5  ALBUMIN 3.5 2.7*   No results found for this basename: LIPASE, AMYLASE,  in the last 168 hours No results found for this basename: AMMONIA,  in the last 168 hours CBC:  Recent Labs Lab 03/14/13 2012 03/15/13 0400 03/16/13 0645  WBC 6.5 7.5 4.6  HGB 13.6 12.4* 11.8*  HCT 40.8 36.8* 35.2*  MCV 96.0 94.6 94.6  PLT 167 142* 141*   Cardiac Enzymes: No results found for this basename: CKTOTAL, CKMB, CKMBINDEX, TROPONINI,  in the last 168 hours BNP: No components found with this basename: POCBNP,  CBG: No results found for this basename: GLUCAP,  in the last 168 hours  Recent Results (from the past 240 hour(s))  CULTURE, BLOOD (ROUTINE X 2)     Status: None   Collection Time    03/14/13  8:12 PM      Result Value Range Status   Specimen Description BLOOD WRIST RIGHT   Final   Special Requests BOTTLES DRAWN AEROBIC AND ANAEROBIC 5CC   Final   Culture  Setup Time 03/15/2013 02:46   Final   Culture     Final   Value:        BLOOD CULTURE RECEIVED NO GROWTH TO DATE CULTURE WILL BE HELD FOR 5 DAYS BEFORE ISSUING A FINAL NEGATIVE REPORT   Report Status PENDING   Incomplete  URINE CULTURE     Status: None   Collection Time    03/14/13  8:12 PM      Result Value Range Status   Specimen Description URINE, RANDOM   Final   Special Requests NONE   Final   Culture  Setup Time 03/14/2013 21:13   Final   Colony Count PENDING   Incomplete   Culture Culture reincubated for better growth   Final   Report Status PENDING   Incomplete  CULTURE, BLOOD (ROUTINE X 2)     Status: None   Collection Time    03/14/13  8:55 PM      Result Value Range Status   Specimen Description BLOOD BLOOD RIGHT FOREARM   Final    Special Requests BOTTLES DRAWN AEROBIC AND ANAEROBIC 5CC   Final   Culture  Setup Time 03/15/2013 02:46   Final   Culture     Final   Value:        BLOOD CULTURE RECEIVED NO GROWTH TO DATE CULTURE WILL BE HELD FOR 5 DAYS BEFORE ISSUING A FINAL NEGATIVE REPORT   Report Status PENDING   Incomplete     Studies:              All Imaging reviewed and is as per above notation   Scheduled Meds: . allopurinol  100 mg Oral Daily  . aspirin  325 mg Oral Daily  . heparin  5,000 Units Subcutaneous Q8H  . piperacillin-tazobactam (ZOSYN)  IV  3.375 g Intravenous Q8H  . sodium chloride  3 mL Intravenous Q12H  . traMADol  50 mg Oral Q6H  . vancomycin  1,250 mg Intravenous Q24H   Continuous Infusions:    Assessment/Plan:    Diagnosis    frequent pauses/bradycardia-appreciate input from Dr. Johney Frame of electrophysiology patient. Patient is asymptomatic with first-degree AV block and nonconducted PACs and occasional second degree Mobitz type I-atenolol has been discontinued, I have held his Imdur but have had to restart it because of hypertension-see below   . SIRS (systemic inflammatory response syndrome)-unclear source of fever-suspect infectious, urine culture is pending still and has been reintubated. He also has history of gout therefore I believe with his chronic pains, although this is less usual, colchicine should be tried as patient is currently on allopurinol which should be held.  If he experiences significant improvement on colchicine, I would continue the same and discontinue antibiotics unless urine culture shows me differently   . HTN (hypertension)-discontinued atenolol other meds, started by dale 1 tablet twice a day   . HYPOTHYROIDISM-history of the same. Currently not on thyroxine replacement. TSH was 1.375. Would recheck in about 6 weeks to determine if needed as an outpatient     gout-hold allopurinol as this can paradoxically worsen gout, start colchicine and travel same.     Code  Status: Full Family Communication: Discussed with family at bedside Disposition Plan: Inpatient   Pleas Koch, MD  [facility] Pager [phone] 03/16/2013, 11:00 AM    LOS: 2 days

## 2013-03-16 NOTE — Progress Notes (Signed)
Pt with HTN today; all BP meds stopped due to HR; pt asymptomatic; MD made aware of last BP 182/76; will await callback.

## 2013-03-16 NOTE — Progress Notes (Signed)
MD returned page; will enter new orders for BP meds and continue to monitor BP.

## 2013-03-16 NOTE — Progress Notes (Signed)
ANTIBIOTIC CONSULT NOTE - Follow-Up  Pharmacy Consult for Zosyn, Vancomycin Indication: FUO  Allergies  Allergen Reactions  . Codeine     Patient Measurements: Height: 5' 10.87" (180 cm) Weight: 194 lb 7.1 oz (88.2 kg) IBW/kg (Calculated) : 74.99 Adjusted Body Weight:   Vital Signs: Temp: 99.3 F (37.4 C) (06/27 0415) Temp src: Oral (06/27 0415) BP: 160/80 mmHg (06/27 0721) Pulse Rate: 57 (06/27 1152) Intake/Output from previous day: 06/26 0701 - 06/27 0700 In: 340 [P.O.:240; IV Piggyback:100] Out: 600 [Urine:600] Intake/Output from this shift: Total I/O In: 120 [P.O.:120] Out: 400 [Urine:400]  Labs:  Recent Labs  03/14/13 2012 03/15/13 0400 03/16/13 0645  WBC 6.5 7.5 4.6  HGB 13.6 12.4* 11.8*  PLT 167 142* 141*  CREATININE 1.28 1.19 1.36*   Estimated Creatinine Clearance: 34.5 ml/min (by C-G formula based on Cr of 1.36). No results found for this basename: VANCOTROUGH, VANCOPEAK, VANCORANDOM, GENTTROUGH, GENTPEAK, GENTRANDOM, TOBRATROUGH, TOBRAPEAK, TOBRARND, AMIKACINPEAK, AMIKACINTROU, AMIKACIN,  in the last 72 hours   Microbiology: Recent Results (from the past 720 hour(s))  CULTURE, BLOOD (ROUTINE X 2)     Status: None   Collection Time    03/14/13  8:12 PM      Result Value Range Status   Specimen Description BLOOD WRIST RIGHT   Final   Special Requests BOTTLES DRAWN AEROBIC AND ANAEROBIC 5CC   Final   Culture  Setup Time 03/15/2013 02:46   Final   Culture     Final   Value:        BLOOD CULTURE RECEIVED NO GROWTH TO DATE CULTURE WILL BE HELD FOR 5 DAYS BEFORE ISSUING A FINAL NEGATIVE REPORT   Report Status PENDING   Incomplete  URINE CULTURE     Status: None   Collection Time    03/14/13  8:12 PM      Result Value Range Status   Specimen Description URINE, RANDOM   Final   Special Requests NONE   Final   Culture  Setup Time 03/14/2013 21:13   Final   Colony Count PENDING   Incomplete   Culture Culture reincubated for better growth   Final   Report Status PENDING   Incomplete  CULTURE, BLOOD (ROUTINE X 2)     Status: None   Collection Time    03/14/13  8:55 PM      Result Value Range Status   Specimen Description BLOOD BLOOD RIGHT FOREARM   Final   Special Requests BOTTLES DRAWN AEROBIC AND ANAEROBIC 5CC   Final   Culture  Setup Time 03/15/2013 02:46   Final   Culture     Final   Value:        BLOOD CULTURE RECEIVED NO GROWTH TO DATE CULTURE WILL BE HELD FOR 5 DAYS BEFORE ISSUING A FINAL NEGATIVE REPORT   Report Status PENDING   Incomplete    Medical History: Past Medical History  Diagnosis Date  . Hypothyroidism   . Gout   . Constipation   . Hypertension     Medications:  Scheduled:  . allopurinol  100 mg Oral Daily  . aspirin  325 mg Oral Daily  . colchicine  0.6 mg Oral BID  . heparin  5,000 Units Subcutaneous Q8H  . piperacillin-tazobactam (ZOSYN)  IV  3.375 g Intravenous Q8H  . sodium chloride  3 mL Intravenous Q12H  . traMADol  50 mg Oral Q6H  . vancomycin  1,250 mg Intravenous Q24H   Assessment: 79 yr old male admitted  feeling weak and unable to walk. He was seen at [facility] and given Bactrim for a UTI. When pt got home, he became weak and nauseated so he called EMS to come to [facility].   Empiric antibiotic coverage was started with vancomycin and zosyn.  Cultures have been negative thus far.  Renal function fairly stable.  Goal of Therapy:  Vancomycin trough level 15-20 mcg/ml  Plan:  Continue Zosyn 3.375 GM IV q8h and Vancomycin 1250 mg IV q24hrs. Will check a vancomycin trough soon if broad-spectrum antibiotics continue.  Tad Moore, BCPS  Clinical Pharmacist Pager [phone]  03/16/2013 1:36 PM

## 2013-03-16 NOTE — Evaluation (Signed)
Occupational Therapy Evaluation Patient Details Name: Mario May MRN: [id number] DOB: 03-12-1915 Today's Date: 03/16/2013 Time: 6213-0865 OT Time Calculation (min): 35 min  OT Assessment / Plan / Recommendation History of present illness Mario May is a 79 y.o. male who presents to the ED with c/o fever and weakness.  He has been diagnosed with SIRS and HTN.     Clinical Impression   Pt currently needs min assist for simulated selfcare tasks and functional transfers.  Demonstrates high fall risk and will need use of RW at discharge as well as 24 hour supervision for safety.  Will benefit from acute care OT to increase overall independence and progress to a supervision level.  Recommend tub bench and 3:1 as well and HHOT to continue progression.  Family plans to help arrange 24 hour supervision.    OT Assessment  Patient needs continued OT Services    Follow Up Recommendations  Home health OT    Barriers to Discharge Decreased caregiver support Family states they will get 24 hour supervision.  Equipment Recommendations  3 in 1 bedside comode;Tub/shower bench       Frequency  Min 2X/week    Precautions / Restrictions Precautions Precautions: Fall Restrictions Weight Bearing Restrictions: No   Pertinent Vitals/Pain Pain in the right hip and neck, nursing made aware    ADL  Eating/Feeding: Simulated;Independent Where Assessed - Eating/Feeding: Chair Grooming: Simulated;Min guard Where Assessed - Grooming: Supported standing Upper Body Bathing: Simulated;Set up Where Assessed - Upper Body Bathing: Unsupported sitting Lower Body Bathing: Simulated;Minimal assistance Where Assessed - Lower Body Bathing: Supported sit to stand Upper Body Dressing: Simulated;Set up Where Assessed - Upper Body Dressing: Unsupported sitting Lower Body Dressing: Simulated;Minimal assistance Where Assessed - Lower Body Dressing: Supported sit to stand Toilet Transfer: Performed;Minimal  assistance Toilet Transfer Method: Other (comment) (ambulate with RW) Acupuncturist: Comfort height toilet Toileting - Clothing Manipulation and Hygiene: Simulated;Minimal assistance Where Assessed - Engineer, mining and Hygiene: Sit to stand from 3-in-1 or toilet Tub/Shower Transfer Method: Not assessed Equipment Used: Gait belt;Rolling walker Transfers/Ambulation Related to ADLs: Pt is overall min assist to min guard assist for mobility using the RW. ADL Comments: Pt with frequent LOB posteriorly with initial standing.  Able to utilize the RW for balance and mobility.  Demonstrates short step length and needs min instructional cueing to stay inside of the walker.  Will need 24 hour supervision if discharged home.  Pt's wife uses a cane and is unable to assist.      OT Diagnosis: Generalized weakness;Acute pain  OT Problem List: Decreased strength;Decreased activity tolerance;Impaired balance (sitting and/or standing) OT Treatment Interventions: Self-care/ADL training;DME and/or AE instruction;Therapeutic activities;Balance training;Patient/family education   OT Goals(Current goals can be found in the care plan section) Acute Rehab OT Goals Patient Stated Goal: Pt wants to be as independent as possible. OT Goal Formulation: With patient/family Time For Goal Achievement: 03/30/13 Potential to Achieve Goals: Good  Visit Information  Last OT Received On: 03/16/13 Assistance Needed: +1 History of Present Illness: Mario May is a 79 y.o. male who presents to the ED with c/o fever and weakness.  He has been diagnosed with SIRS and HTN.         Prior Functioning     Home Living Family/patient expects to be discharged to:: Private residence Living Arrangements: Spouse/significant other Available Help at Discharge: Family Type of Home: House Home Access: Stairs to enter [employer] of Steps: 2 Entrance Stairs-Rails: None  Home Layout: One  level Home Equipment: Cane - single point;Grab bars - tub/shower Prior Function Level of Independence: Independent with assistive device(s) Communication Communication: HOH Dominant Hand: Right         Vision/Perception Vision - History Baseline Vision: Wears glasses all the time Patient Visual Report: No change from baseline Vision - Assessment Vision Assessment: Vision not tested Perception Perception: Within Functional Limits Praxis Praxis: Intact   Cognition  Cognition Arousal/Alertness: Awake/alert Behavior During Therapy: WFL for tasks assessed/performed Overall Cognitive Status: Within Functional Limits for tasks assessed    Extremity/Trunk Assessment Upper Extremity Assessment Upper Extremity Assessment: Generalized weakness Lower Extremity Assessment Lower Extremity Assessment: Defer to PT evaluation Cervical / Trunk Assessment Cervical / Trunk Assessment: Normal     Mobility Transfers Transfers: Sit to Stand;Stand to Sit Sit to Stand: 4: Min assist;With upper extremity assist;With armrests;From toilet Stand to Sit: 4: Min assist;With armrests;With upper extremity assist;To toilet        Balance Balance Balance Assessed: Yes Static Standing Balance Static Standing - Balance Support: No upper extremity supported Static Standing - Level of Assistance: 4: Min assist High Level Balance High Level Balance Comments: Pt with frequent LOB with initial standing and no UE support.   End of Session OT - End of Session Equipment Utilized During Treatment: Rolling walker Activity Tolerance: Patient tolerated treatment well Patient left: in chair;with call bell/phone within reach;with family/visitor present Nurse Communication: Mobility status     Aida Lemaire OTR/L Pager number [phone] 03/16/2013, 12:20 PM

## 2013-03-16 NOTE — Consult Note (Signed)
ELECTROPHYSIOLOGY CONSULT NOTE    Patient ID: Mario May MRN: [id number], DOB/AGE: [birth year] 79 y.o.  Admit date: 03/14/2013 Date of Consult: 03-16-2013  Primary Physician: [name], MD Primary Cardiologist: previously seen by Dr Jens Som as an inpatient in 2001  Reason for Consultation: bradycardia  HPI:  Mario May is a 79 year old male with a history of hypothyroidism, gout, and hypertension.    On Tuesday of this week, he began to have fever and chills as well as left head and ear pain.  He went to [facility] in [place] and was given Bactrim.  He did not have improvement in symptoms and so his daughter brought him to [facility] for further evaluation.    There has been no source yet identified for his fevers.  He is currently on Zosyn for SIRS.  He is very active at home.  He lives with his wife and is still driving.  He has no prior history of syncope, pre-syncope, dizziness, palpitations, or chest pain.  He has noticed for the past 2 days he has been short of breath with activity.  Home medications included Atenolol 25mg  daily.  TSH is normal.  No echo has been done.   Telemetry this admission has demonstrated sinus rhythm with blocked PAC's.    EP has been asked to evaluate for treatment options.  ROS is negative except as outlined above.   Past Medical History  Diagnosis Date  . Hypothyroidism   . Gout   . Constipation   . Hypertension      Surgical History:  Past Surgical History  Procedure Laterality Date  . None       Prescriptions prior to admission  Medication Sig Dispense Refill  . allopurinol (ZYLOPRIM) 100 MG tablet Take 100 mg by mouth daily.      Marland Kitchen aspirin 325 MG tablet Take 325 mg by mouth daily.      Marland Kitchen atenolol (TENORMIN) 25 MG tablet Take 12.5 mg by mouth at bedtime.      . isosorbide dinitrate (ISORDIL) 10 MG tablet Take 10 mg by mouth 2 (two) times daily.      . meclizine (ANTIVERT) 25 MG tablet Take 25 mg by mouth  daily as needed for dizziness or nausea.      Marland Kitchen sulfamethoxazole-trimethoprim (BACTRIM DS) 800-160 MG per tablet Take 1 tablet by mouth 2 (two) times daily. For 10 days; Start date 03/14/13        Inpatient Medications:  . allopurinol  100 mg Oral Daily  . aspirin  325 mg Oral Daily  . heparin  5,000 Units Subcutaneous Q8H  . piperacillin-tazobactam (ZOSYN)  IV  3.375 g Intravenous Q8H  . sodium chloride  3 mL Intravenous Q12H  . traMADol  50 mg Oral Q6H  . vancomycin  1,250 mg Intravenous Q24H    Allergies:  Allergies  Allergen Reactions  . Codeine     History   Social History  . Marital Status: Married    Spouse Name: N/A    Number of Children: N/A  . Years of Education: N/A   Occupational History  . Not on file.   Social History Main Topics  . Smoking status: Never Smoker   . Smokeless tobacco: Not on file  . Alcohol Use: No  . Drug Use: No  . Sexually Active: Not on file   Other Topics Concern  . Not on file   Social History Narrative  . No narrative on file  History reviewed. No pertinent family history.    Labs:   Lab Results  Component Value Date   WBC 4.6 03/16/2013   HGB 11.8* 03/16/2013   HCT 35.2* 03/16/2013   MCV 94.6 03/16/2013   PLT 141* 03/16/2013    Recent Labs Lab 03/14/13 2012 03/15/13 0400  NA 135 133*  K 4.0 3.6  CL 100 102  CO2 23 20  BUN 29* 25*  CREATININE 1.28 1.19  CALCIUM 9.1 8.0*  PROT 8.0  --   BILITOT 0.7  --   ALKPHOS 128*  --   ALT 9  --   AST 29  --   GLUCOSE 117* 122*    Radiology/Studies: Dg Chest 2 View 03/14/2013   *RADIOLOGY REPORT*  Clinical Data: Weakness  CHEST - 2 VIEW  Comparison: 07/21/2009  Findings: Mild cardiomegaly without CHF or pneumonia.  Minimal basilar atelectasis versus scarring.  No effusion or pneumothorax. Atherosclerotic calcification of the aorta.  Trachea is midline. Degenerative changes of the spine.  IMPRESSION: Cardiomegaly without CHF or pneumonia   Original Report Authenticated By:  Judie Petit. Miles Costain, M.D.   Ct Soft Tissue Neck Wo Contrast 03/14/2013   *RADIOLOGY REPORT*  Clinical Data:  Pain behind left ear  CT NECK WITH CONTRAST  Technique:  Multidetector CT imaging of the neck was performed using the standard protocol following the bolus administration of intravenous contrast.  Contrast: 75mL OMNIPAQUE IOHEXOL 300 MG/ML  SOLN  Comparison:   None  Findings: Majority of the mastoid sinus is visualized and is clear bilaterally.  External auditory canal is patent.  No bony lesion in the skull base.  Negative for mass or adenopathy in the neck.  No evidence of abscess.  The tonsils are normal.  The tongue is normal. Epiglottis and larynx are normal.  Parotid and submandibular glands are normal bilaterally.  Cervical disc degeneration and facet degeneration diffusely.  No acute bony abnormality.  IMPRESSION: No acute abnormality.   Original Report Authenticated By: Janeece Riggers, M.D.   ZOX:WRUEA bradycardia with blocked PAC's, first degree AV block  TELEMETRY: sinus rhythm with sinus arrhythmia, blocked PAC's Physical Exam: Filed Vitals:   03/16/13 0415 03/16/13 0721 03/16/13 1152 03/16/13 1420  BP: 177/75 160/80  182/76  Pulse: 54  57 52  Temp: 99.3 F (37.4 C)   97.2 F (36.2 C)  TempSrc: Oral   Oral  Resp:    20  Height:      Weight:      SpO2: 97%  97% 100%    GEN- The patient is well appearing, alert and oriented x 3 today.  He looks younger than his stated age Head- normocephalic, atraumatic Eyes-  Sclera clear, conjunctiva pink Ears- hearing is significantly impaired Oropharynx- clear Neck- supple, no JVP Lungs- Clear to ausculation bilaterally, normal work of breathing Heart- Regular rate and rhythm, no murmurs, rubs or gallops, PMI not laterally displaced GI- soft, NT, ND, + BS Extremities- no clubbing, cyanosis, trace edema MS- age appropriate muscle atrophy Skin- diffuse ecchymosis Psych- euthymic mood, full affect Neuro- strength and sensation are  intact  Assessment and Plan:  1. Asymptomatic bradycardia/ mobitz I second degree AV block, nonconducted PACs Mario May remains very active for his age.  He has a first degree AV block and occasional mobitz I second degree AV block.  Though he likely does have some degenerative conduction disease, his QRS is narrow and he is asymptomatic.  I think that the most prudent option at this point would be  to stop atenolol.  He does not have an indication for pacing at this time.  He may however require pacing down the road if symptoms develop. As he is asymptomatic, I would not recommend event monitoring at this time. No further EP recommendations for now.  2. Fevers Routine workup per internal medicine  Will sign off  Call with questions

## 2013-03-17 LAB — CBC
HCT: 35.4 % — ABNORMAL LOW (ref 39.0–52.0)
Hemoglobin: 11.9 g/dL — ABNORMAL LOW (ref 13.0–17.0)
MCH: 31.8 pg (ref 26.0–34.0)
MCV: 94.7 fL (ref 78.0–100.0)
RBC: 3.74 MIL/uL — ABNORMAL LOW (ref 4.22–5.81)

## 2013-03-17 NOTE — Progress Notes (Signed)
PROGRESS NOTE  BRAELYN BORDONARO WUJ:[id number] DOB: [birth year] DOA: 03/14/2013 PCP: [name], MD  Brief narrative: 79 year male admitted 626 with left ear pain, dysuria and sent home from ED only to return with worsening fever or and weakness. Known to have history of gout.  Past medical history-As per Problem list  Consultants:  Cardiology-EP  Procedures:  Chest x-ray two-view 6/25 equal normal   CT neck 6/25 normal  Antibiotics:  Vancomycin 6/25  Zosyn 6/25   Subjective  Feels better.  Hip pain/ear pain reduced with Colchicine.  UC still not back.  Family in room. NO other real c/o fever chill or SOB and Fever curve seems also lower   Objective    Interim History:3 Ot rec=min assist for simulated selfcare tasks and functional transfers. Demonstrates high fall risk and will need use of RW at discharge as well as 24 hour supervision for safety. Will benefit from acute care OT to increase overall independence and progress to a supervision level. Recommend tub bench and 3:1 as well and HHOT to continue progression. Family plans to help arrange 24 hour supervision.    Telemetry: Frequent pauses of 2-2.5 sec, brady  Objective: Filed Vitals:   03/16/13 1420 03/16/13 1715 03/16/13 2141 03/17/13 0358  BP: 182/76 170/75 136/91 153/79  Pulse: 52 53 51 49  Temp: 97.2 F (36.2 C)  97.5 F (36.4 C) 97.5 F (36.4 C)  TempSrc: Oral  Oral Oral  Resp: 20  18 18   Height:      Weight:      SpO2: 100%  100% 99%    Intake/Output Summary (Last 24 hours) at 03/17/13 1146 Last data filed at 03/17/13 0816  Gross per 24 hour  Intake    650 ml  Output   1225 ml  Net   -575 ml    Exam:  General: Alert pleasant oriented no apparent distress Cardiovascular: S1-S2 bradycardic Respiratory: Clinically clear Abdomen: Soft nontender nondistended  Data Reviewed: Basic Metabolic Panel:  Recent Labs Lab 03/14/13 2012 03/15/13 0400 03/16/13 0645  NA 135 133* 133*   K 4.0 3.6 3.7  CL 100 102 104  CO2 23 20 20   GLUCOSE 117* 122* 101*  BUN 29* 25* 27*  CREATININE 1.28 1.19 1.36*  CALCIUM 9.1 8.0* 8.1*   Liver Function Tests:  Recent Labs Lab 03/14/13 2012 03/16/13 0645  AST 29 41*  ALT 9 16  ALKPHOS 128* 128*  BILITOT 0.7 0.7  PROT 8.0 6.5  ALBUMIN 3.5 2.7*   No results found for this basename: LIPASE, AMYLASE,  in the last 168 hours No results found for this basename: AMMONIA,  in the last 168 hours CBC:  Recent Labs Lab 03/14/13 2012 03/15/13 0400 03/16/13 0645 03/17/13 0428  WBC 6.5 7.5 4.6 3.9*  HGB 13.6 12.4* 11.8* 11.9*  HCT 40.8 36.8* 35.2* 35.4*  MCV 96.0 94.6 94.6 94.7  PLT 167 142* 141* 156   Cardiac Enzymes: No results found for this basename: CKTOTAL, CKMB, CKMBINDEX, TROPONINI,  in the last 168 hours BNP: No components found with this basename: POCBNP,  CBG: No results found for this basename: GLUCAP,  in the last 168 hours  Recent Results (from the past 240 hour(s))  CULTURE, BLOOD (ROUTINE X 2)     Status: None   Collection Time    03/14/13  8:12 PM      Result Value Range Status   Specimen Description BLOOD WRIST RIGHT   Final   Special Requests BOTTLES  DRAWN AEROBIC AND ANAEROBIC 5CC   Final   Culture  Setup Time 03/15/2013 02:46   Final   Culture     Final   Value:        BLOOD CULTURE RECEIVED NO GROWTH TO DATE CULTURE WILL BE HELD FOR 5 DAYS BEFORE ISSUING A FINAL NEGATIVE REPORT   Report Status PENDING   Incomplete  URINE CULTURE     Status: None   Collection Time    03/14/13  8:12 PM      Result Value Range Status   Specimen Description URINE, RANDOM   Final   Special Requests NONE   Final   Culture  Setup Time 03/14/2013 21:13   Final   Colony Count PENDING   Incomplete   Culture Culture reincubated for better growth   Final   Report Status PENDING   Incomplete  CULTURE, BLOOD (ROUTINE X 2)     Status: None   Collection Time    03/14/13  8:55 PM      Result Value Range Status   Specimen  Description BLOOD BLOOD RIGHT FOREARM   Final   Special Requests BOTTLES DRAWN AEROBIC AND ANAEROBIC 5CC   Final   Culture  Setup Time 03/15/2013 02:46   Final   Culture     Final   Value:        BLOOD CULTURE RECEIVED NO GROWTH TO DATE CULTURE WILL BE HELD FOR 5 DAYS BEFORE ISSUING A FINAL NEGATIVE REPORT   Report Status PENDING   Incomplete     Studies:              All Imaging reviewed and is as per above notation   Scheduled Meds: . aspirin  325 mg Oral Daily  . colchicine  0.6 mg Oral BID  . heparin  5,000 Units Subcutaneous Q8H  . isosorbide-hydrALAZINE  1 tablet Oral BID  . piperacillin-tazobactam (ZOSYN)  IV  3.375 g Intravenous Q8H  . sodium chloride  3 mL Intravenous Q12H  . traMADol  50 mg Oral Q6H  . vancomycin  1,250 mg Intravenous Q24H   Continuous Infusions:    Assessment/Plan:    Diagnosis    frequent pauses/bradycardia-appreciate input from Dr. Johney Frame of electrophysiology patient. Patient is asymptomatic with first-degree AV block and nonconducted PACs and occasional second degree Mobitz type I-atenolol has been discontinued, I have held his Imdur but have had to restart it because of hypertension-see below   . SIRS (systemic inflammatory response syndrome)-unclear source of fever-suspect infectious, urine culture is pending still.  Discussed 6/28 with daughter to hold Abx vs not-for now continue Zosyn for potential Pyelo.  He also has history of gout therefore I believe with his chronic pains, although this is less usual, colchicine should be tried as patient is currently on allopurinol which should be held.   Marland Kitchen HTN (hypertension)-discontinued atenolol other meds, started by Bidil 1 tablet twice a day   . HYPOTHYROIDISM-history of the same. Currently not on thyroxine replacement. TSH was 1.375. Would recheck in about 6 weeks to determine if needed as an outpatient     gout-hold allopurinol as this can paradoxically worsen gout, start colchicine and travel same.      Code Status: Full Family Communication: Discussed with family at bedside-await UC, cont IV abx-possible d/c with HH in am Disposition Plan: Inpatient   Pleas Koch, MD  [facility] Pager [phone] 03/17/2013, 11:46 AM    LOS: 3 days

## 2013-03-18 LAB — URINE CULTURE

## 2013-03-18 LAB — CBC
HCT: 35.4 % — ABNORMAL LOW (ref 39.0–52.0)
MCH: 31.4 pg (ref 26.0–34.0)
MCHC: 33.6 g/dL (ref 30.0–36.0)
MCV: 93.4 fL (ref 78.0–100.0)
RDW: 13.3 % (ref 11.5–15.5)
WBC: 4.8 10*3/uL (ref 4.0–10.5)

## 2013-03-18 MED ORDER — HYDRALAZINE HCL 25 MG PO TABS
25.0000 mg | ORAL_TABLET | Freq: Four times a day (QID) | ORAL | Status: DC
  Administered 2013-03-18 – 2013-03-19 (×5): 25 mg via ORAL
  Filled 2013-03-18 (×8): qty 1

## 2013-03-18 MED ORDER — GABAPENTIN 100 MG PO CAPS
100.0000 mg | ORAL_CAPSULE | Freq: Every day | ORAL | Status: DC
  Administered 2013-03-18: 100 mg via ORAL
  Filled 2013-03-18 (×2): qty 1

## 2013-03-18 NOTE — Progress Notes (Signed)
PROGRESS NOTE  Mario May:[id number] DOB: 1915-08-13 DOA: 03/14/2013 PCP: [name], MD  Brief narrative: 79 year male admitted 626 with left ear pain, dysuria and sent home from ED only to return with worsening fever or and weakness. Known to have history of gout.patient was evaluated for potential sepsis and blood cultures urine cultures were done, result of the latter still pending.  Past medical history-As per Problem list  Consultants:  Cardiology-EP  Procedures:  Chest x-ray two-view 6/25 equal normal   CT neck 6/25 normal  Antibiotics:  Vancomycin 6/25  Zosyn 6/25   Subjective  Feels better.  Still has hip pain, now thinks this was because of not being on Lipitor Relates while in [employer] service had sciatica right leg-states pain similar to this States pain shooting down to top of foot Other than pain in legs, no nausea vomiting, passing stool, good tolerance of diet   Objective    Interim History:3 Ot rec=min assist for simulated selfcare tasks and functional transfers. Demonstrates high fall risk and will need use of RW at discharge as well as 24 hour supervision for safety. Will benefit from acute care OT to increase overall independence and progress to a supervision level. Recommend tub bench and 3:1 as well and HHOT to continue progression. Family plans to help arrange 24 hour supervision.    Telemetry: Frequent pauses of 2-2.5 sec, brady  Objective: Filed Vitals:   03/17/13 0358 03/17/13 1500 03/17/13 2057 03/18/13 0527  BP: 153/79 144/85 186/87 154/67  Pulse: 49 52 66 58  Temp: 97.5 F (36.4 C) 96.7 F (35.9 C) 97.4 F (36.3 C) 97.9 F (36.6 C)  TempSrc: Oral Oral Oral Oral  Resp: 18 18 20 18   Height:      Weight:      SpO2: 99% 100% 100% 98%    Intake/Output Summary (Last 24 hours) at 03/18/13 1015 Last data filed at 03/18/13 0837  Gross per 24 hour  Intake    356 ml  Output   1500 ml  Net  -1144 ml     Exam:  General: Alert pleasant oriented no apparent distress Cardiovascular: S1-S2 bradycardic Respiratory: Clinically clear Abdomen: Soft nontender nondistended  Data Reviewed: Basic Metabolic Panel:  Recent Labs Lab 03/14/13 2012 03/15/13 0400 03/16/13 0645  NA 135 133* 133*  K 4.0 3.6 3.7  CL 100 102 104  CO2 23 20 20   GLUCOSE 117* 122* 101*  BUN 29* 25* 27*  CREATININE 1.28 1.19 1.36*  CALCIUM 9.1 8.0* 8.1*   Liver Function Tests:  Recent Labs Lab 03/14/13 2012 03/16/13 0645  AST 29 41*  ALT 9 16  ALKPHOS 128* 128*  BILITOT 0.7 0.7  PROT 8.0 6.5  ALBUMIN 3.5 2.7*   No results found for this basename: LIPASE, AMYLASE,  in the last 168 hours No results found for this basename: AMMONIA,  in the last 168 hours CBC:  Recent Labs Lab 03/14/13 2012 03/15/13 0400 03/16/13 0645 03/17/13 0428 03/18/13 0543  WBC 6.5 7.5 4.6 3.9* 4.8  HGB 13.6 12.4* 11.8* 11.9* 11.9*  HCT 40.8 36.8* 35.2* 35.4* 35.4*  MCV 96.0 94.6 94.6 94.7 93.4  PLT 167 142* 141* 156 184   Cardiac Enzymes: No results found for this basename: CKTOTAL, CKMB, CKMBINDEX, TROPONINI,  in the last 168 hours BNP: No components found with this basename: POCBNP,  CBG: No results found for this basename: GLUCAP,  in the last 168 hours  Recent Results (from the past 240 hour(s))  CULTURE, BLOOD (ROUTINE X 2)     Status: None   Collection Time    03/14/13  8:12 PM      Result Value Range Status   Specimen Description BLOOD WRIST RIGHT   Final   Special Requests BOTTLES DRAWN AEROBIC AND ANAEROBIC 5CC   Final   Culture  Setup Time 03/15/2013 02:46   Final   Culture     Final   Value:        BLOOD CULTURE RECEIVED NO GROWTH TO DATE CULTURE WILL BE HELD FOR 5 DAYS BEFORE ISSUING A FINAL NEGATIVE REPORT   Report Status PENDING   Incomplete  URINE CULTURE     Status: None   Collection Time    03/14/13  8:12 PM      Result Value Range Status   Specimen Description URINE, RANDOM   Final    Special Requests NONE   Final   Culture  Setup Time 03/14/2013 21:13   Final   Colony Count 60,000 COLONIES/ML   Final   Culture     Final   Value: STAPHYLOCOCCUS SPECIES (COAGULASE NEGATIVE)     Note: RIFAMPIN AND GENTAMICIN SHOULD NOT BE USED AS SINGLE DRUGS FOR TREATMENT OF STAPH INFECTIONS.   Report Status 03/18/2013 FINAL   Final   Organism ID, Bacteria STAPHYLOCOCCUS SPECIES (COAGULASE NEGATIVE)   Final  CULTURE, BLOOD (ROUTINE X 2)     Status: None   Collection Time    03/14/13  8:55 PM      Result Value Range Status   Specimen Description BLOOD BLOOD RIGHT FOREARM   Final   Special Requests BOTTLES DRAWN AEROBIC AND ANAEROBIC 5CC   Final   Culture  Setup Time 03/15/2013 02:46   Final   Culture     Final   Value:        BLOOD CULTURE RECEIVED NO GROWTH TO DATE CULTURE WILL BE HELD FOR 5 DAYS BEFORE ISSUING A FINAL NEGATIVE REPORT   Report Status PENDING   Incomplete     Studies:              All Imaging reviewed and is as per above notation   Scheduled Meds: . aspirin  325 mg Oral Daily  . colchicine  0.6 mg Oral BID  . heparin  5,000 Units Subcutaneous Q8H  . isosorbide-hydrALAZINE  1 tablet Oral BID  . piperacillin-tazobactam (ZOSYN)  IV  3.375 g Intravenous Q8H  . sodium chloride  3 mL Intravenous Q12H  . traMADol  50 mg Oral Q6H   Continuous Infusions:    Assessment/Plan:    Diagnosis    frequent pauses/bradycardia-appreciate input from Dr. Johney Frame of electrophysiology patient. Patient is asymptomatic with first-degree AV block and nonconducted PACs and occasional second degree Mobitz type I-atenolol has been discontinued, I have held his Imdur but have had to restart it because of hypertension-see below  This will be a chronic problem but he currently is asymptomatic-not a candidate for pacer  . SIRS (systemic inflammatory response syndrome)-unclear source of fever-suspect infectious-  Continue Zosyn monotherapy, urine culture pending still-duration 5-7 days total  antibiotics  . HTN (hypertension)-discontinued atenolol other meds, started by Bidil 1 tablet twice a day-still not well controlled. Change to hydralazine25 every 6 hourly-aware of difficulties with dosing was in outpatient setting  . HYPOTHYROIDISM-history of the same. Currently not on thyroxine replacement. TSH was 1.375. Would recheck in about 6 weeks to determine if needed as an outpatient    Possible right  sciatica-start low-dose Neurontin 100 each bedtime. Has been seen by an orthopedic surgeon in the past      gout-hold allopurinol as this can paradoxically worsen gout, start colchicine and travel same.     Code Status: Full Family Communication: Discussed with family at bedside-await UC, cont IV abx-possible d/c when results back Disposition Plan: Inpatient   Pleas Koch, MD  [facility] Pager [phone] 03/18/2013, 10:15 AM    LOS: 4 days

## 2013-03-18 NOTE — Progress Notes (Signed)
Pt amb 122ft with 2XA pushing walker. Unsteady gait noted. Pt did not have any complaints. Pt placed in chair with call bell in reach. Will continue to monitor pt closely.

## 2013-03-19 LAB — BASIC METABOLIC PANEL
BUN: 19 mg/dL (ref 6–23)
Chloride: 101 mEq/L (ref 96–112)
Creatinine, Ser: 1.02 mg/dL (ref 0.50–1.35)
GFR calc Af Amer: 70 mL/min — ABNORMAL LOW (ref >90)

## 2013-03-19 MED ORDER — HYDRALAZINE HCL 20 MG/ML IJ SOLN
10.0000 mg | Freq: Once | INTRAMUSCULAR | Status: AC
  Administered 2013-03-19: 10 mg via INTRAVENOUS
  Filled 2013-03-19: qty 1

## 2013-03-19 MED ORDER — COLCHICINE 0.6 MG PO TABS: 0.6000 mg | ORAL_TABLET | Freq: Two times a day (BID) | ORAL | Status: DC | PRN

## 2013-03-19 MED ORDER — TRAMADOL HCL 50 MG PO TABS: 50.0000 mg | ORAL_TABLET | Freq: Four times a day (QID) | ORAL | Status: DC

## 2013-03-19 MED ORDER — GABAPENTIN 100 MG PO CAPS: 100.0000 mg | ORAL_CAPSULE | Freq: Every day | ORAL | Status: DC

## 2013-03-19 MED ORDER — ISOSORB DINITRATE-HYDRALAZINE 20-37.5 MG PO TABS: 1.0000 | ORAL_TABLET | Freq: Two times a day (BID) | ORAL | Status: DC

## 2013-03-19 MED ORDER — COLCHICINE 0.6 MG PO TABS: 0.6000 mg | ORAL_TABLET | Freq: Two times a day (BID) | ORAL | Status: DC

## 2013-03-19 NOTE — Progress Notes (Signed)
Physical Therapy Treatment Patient Details Name: Mario May MRN: [id number] DOB: 14-Sep-1915 Today's Date: 03/19/2013 Time: 4098-1191 PT Time Calculation (min): 26 min  PT Assessment / Plan / Recommendation  PT Comments   Patient is making good progress with PT. Will need to ensure that patient has assistance at home from his daughter. MD stated that he would follow up with her since he had spoke with her previously. Patient is primary caregiver for his wife and is now requiring Min A for himself for mobility. Patient states that his son and daughter are very helpful. Patient stated that he has only one small step to get into back of his house. Did not attempt today due to fatique. From a mobility standpoint anticipate patient will be ready for DC home possibly tomorrow based on goals set and to ensure A with family.     Follow Up Recommendations  Home health PT;Supervision/Assistance - 24 hour     Does the patient have the potential to tolerate intense rehabilitation     Barriers to Discharge        Equipment Recommendations  Rolling walker with 5" wheels    Recommendations for Other Services    Frequency Min 3X/week   Progress towards PT Goals Progress towards PT goals: Progressing toward goals  Plan Current plan remains appropriate    Precautions / Restrictions Precautions Precautions: Fall   Pertinent Vitals/Pain no apparent distress     Mobility  Transfers Sit to Stand: 4: Min assist;With upper extremity assist;With armrests;From chair/3-in-1 Stand to Sit: With armrests;With upper extremity assist;To toilet;4: Min guard Details for Transfer Assistance: A for steadying stand. Cues for safe hand placement and positioning Ambulation/Gait Ambulation/Gait Assistance: 4: Min assist Ambulation Distance (Feet): 90 Feet Assistive device: Rolling walker Ambulation/Gait Assistance Details: Max cues to stand withing RW. A for RW management.  Gait Pattern: Step-through  pattern;Decreased stride length;Shuffle Gait velocity: decreased    Exercises     PT Diagnosis:    PT Problem List:   PT Treatment Interventions:     PT Goals (current goals can now be found in the care plan section)    Visit Information  Last PT Received On: 03/19/13 Assistance Needed: +1 History of Present Illness: OLUWATOMISIN May is a 79 y.o. male who presents to the ED with c/o fever and weakness.  He has been diagnosed with SIRS and HTN.      Subjective Data      Cognition  Cognition Arousal/Alertness: Awake/alert Behavior During Therapy: WFL for tasks assessed/performed Overall Cognitive Status: Within Functional Limits for tasks assessed    Balance  Static Standing Balance Static Standing - Balance Support: No upper extremity supported Static Standing - Level of Assistance: 5: Stand by assistance  End of Session PT - End of Session Equipment Utilized During Treatment: Gait belt Activity Tolerance: Patient tolerated treatment well Patient left: in chair;with call bell/phone within reach Nurse Communication: Mobility status   GP     Fredrich Birks 03/19/2013, 9:08 AM 03/19/2013 Fredrich Birks PTA [phone] pager [phone] office

## 2013-03-19 NOTE — Progress Notes (Signed)
Discharge instructions/teaching performed with Pt's daughter who will transport home and also be his primary caregiver for the foreseeable future. Time allowed for questions/concerns. BSC and walker provided and transported home by daughter. Pt left the unit via wheelchair accompanied by volunteer to the main entrance where his daughter awaits.   -Stevie Ertle,RN

## 2013-03-19 NOTE — Progress Notes (Signed)
PT BP ELEVATED TO 190/100. GAVE PT SCHEDULED HYDRALIZINE. 25MG . WAITED ABOUT 1.5HR. RECHECKED BP, 198/90 TEXT PAGE MD ON CALL. ORDERS GIVEN. MONITORING WILLCONTINUE.

## 2013-03-19 NOTE — Progress Notes (Signed)
PT HAS FREQUENCY IN VOIDING, PT VERY ANXIOUS, REQUESTING THE CONDOM CATH TO BE PLACE BACK ON BECAUSE HE HAS NOT BEEN SLEEP IN TWO DAYS D/T HIS FREQUENT URINATION.  PLACE CONDOM CATH AS PT REQUEST.PT TOLERATED WELL. PT RESTING.

## 2013-03-19 NOTE — Discharge Summary (Addendum)
Physician Discharge Summary  Mario May ZOX:[id number] DOB: [birth year] DOA: 03/14/2013  PCP: [name], MD  Admit date: 03/14/2013 Discharge date: 03/19/2013  Time spent: 40 minutes  Recommendations for Outpatient Follow-up:  1. Needs bedside commode, 3-in-1  2. Monitor blood pressures in outpatient, recommend not to place on nodal agents 3. Please consider workup of right leg pain-suspicious for sciatica-he is seen in orthopedic surgeon in the past and this was not thought to be anything suspicious anything more than osteoarthritis 4. Recommend outpatient EP followup if blood pressure still significantly high 5. Take colchicine only when necessary given some diarrhea prior to discharge, start back allopurinol  Discharge Diagnoses:  Principal Problem:   SIRS (systemic inflammatory response syndrome) Active Problems:   HYPOTHYROIDISM   HTN (hypertension)   Discharge Condition: Stable  Diet recommendation: Heart healthy  Filed Weights   03/14/13 1923 03/15/13 0147  Weight: 84.4 kg (186 lb 1.1 oz) 88.2 kg (194 lb 7.1 oz)    History of present illness:  79 year male admitted 6/26 with left ear pain, dysuria and sent home from ED only to return with worsening fever or and weakness. Known to have history of gout. Patient noted during his hospital stay to have some bradycardia and pauses-please see below for further  Hospital Course:  Assessment/Plan:   Diagnosis    Chronic brady-frequent pauses/bradycardia-appreciate input from Dr. Johney Frame of electrophysiology. Patient is asymptomatic with first-degree AV block and nonconducted PACs and occasional second degree Mobitz type I-atenolol has been discontinued, I have held his Imdur but have had to restart NItrates  because of hypertension-see below  This will be a chronic problem but he currently is asymptomatic-not a candidate for pacer   .  SIRS (systemic inflammatory response syndrome)--suspect infectious-urine culture  Pan-sensitive Staphylococcus completed  monotherapy,   .  HTN (hypertension)-discontinued atenolol other meds, started by Bidil 1 tablet twice a day-still not well controlled. Would be willing to tolerate high blood pressures and nonagenarian-patient primary care physician input requested, potentially could followup with cardiology for further recommendations   .  HYPOTHYROIDISM-history of the same. Currently not on thyroxine replacement. TSH was 1.375. Would recheck in about 6 weeks to determine if needed as an outpatient    Possible right sciatica-start low-dose Neurontin 100 each bedtime. Has been seen by an orthopedic surgeon in the past.   gout-hold allopurinol as this can paradoxically worsen gout, start colchicine-colchicine to be taken only when necessary as causing diarrhea  He -restarted allopurinol on discharge.     Consultants:  Cardiology-EP  Procedures:  Chest x-ray two-view 6/25 equal normal  CT neck 6/25 normal  Antibiotics:  Vancomycin 6/25-6/29  Zosyn 6/25-6/29   Discharge Exam: Filed Vitals:   03/18/13 1400 03/18/13 1830 03/18/13 2000 03/19/13 0435  BP: 152/70 172/60 190/100 152/76  Pulse: 52  65 61  Temp:   97.7 F (36.5 C) 97.9 F (36.6 C)  TempSrc:   Oral Oral  Resp: 18  18 18   Height:      Weight:      SpO2: 100%  98% 97%   Alert pleasant very hard of hearing. Felt anxious about going back and forth for urination yesterday Therapy seen and this morning and recommends outpatient therapy and safety monitoring at home with 24 supervision at least one to 2 days  General: Alert pleasant oriented Cardiovascular: S1-S2 no murmur rub or gallop, occasional pauses, some bradycardia Respiratory: Clear Foot does look good  Discharge Instructions  Discharge Orders   Future  Orders Complete By Expires     Call MD for:  difficulty breathing, headache or visual disturbances  As directed     Call MD for:  hives  As directed     Call MD for:  persistant dizziness  or light-headedness  As directed     Call MD for:  persistant nausea and vomiting  As directed     Call MD for:  severe uncontrolled pain  As directed     Diet - low sodium heart healthy  As directed     Discharge instructions  As directed     Comments:      Patient has had a couple of changes on his medications. Please make sure we continue only the ones on are listed We will be discharging him with possibly a walker and a bedside commode because of his frequent urination He will be getting some pain medications refills-please see primary care physician for further needs further    Increase activity slowly  As directed         Medication List    STOP taking these medications       atenolol 25 MG tablet  Commonly known as:  TENORMIN     isosorbide dinitrate 10 MG tablet  Commonly known as:  ISORDIL     meclizine 25 MG tablet  Commonly known as:  ANTIVERT     sulfamethoxazole-trimethoprim 800-160 MG per tablet  Commonly known as:  BACTRIM DS      TAKE these medications       allopurinol 100 MG tablet  Commonly known as:  ZYLOPRIM  Take 100 mg by mouth daily.     aspirin 325 MG tablet  Take 325 mg by mouth daily.     colchicine 0.6 MG tablet  Take 1 tablet (0.6 mg total) by mouth 2 (two) times daily as needed.     gabapentin 100 MG capsule  Commonly known as:  NEURONTIN  Take 1 capsule (100 mg total) by mouth at bedtime.     isosorbide-hydrALAZINE 20-37.5 MG per tablet  Commonly known as:  BIDIL  Take 1 tablet by mouth 2 (two) times daily.     traMADol 50 MG tablet  Commonly known as:  ULTRAM  Take 1 tablet (50 mg total) by mouth every 6 (six) hours.       Allergies  Allergen Reactions  . Codeine       The results of significant diagnostics from this hospitalization (including imaging, microbiology, ancillary and laboratory) are listed below for reference.    Significant Diagnostic Studies: Dg Chest 2 View  03/14/2013   *RADIOLOGY REPORT*  Clinical  Data: Weakness  CHEST - 2 VIEW  Comparison: 07/21/2009  Findings: Mild cardiomegaly without CHF or pneumonia.  Minimal basilar atelectasis versus scarring.  No effusion or pneumothorax. Atherosclerotic calcification of the aorta.  Trachea is midline. Degenerative changes of the spine.  IMPRESSION: Cardiomegaly without CHF or pneumonia   Original Report Authenticated By: Judie Petit. Miles Costain, M.D.   Ct Soft Tissue Neck W Contrast  03/14/2013   *RADIOLOGY REPORT*  Clinical Data:  Pain behind left ear  CT NECK WITH CONTRAST  Technique:  Multidetector CT imaging of the neck was performed using the standard protocol following the bolus administration of intravenous contrast.  Contrast: 75mL OMNIPAQUE IOHEXOL 300 MG/ML  SOLN  Comparison:   None  Findings: Majority of the mastoid sinus is visualized and is clear bilaterally.  External auditory canal is patent.  No bony lesion  in the skull base.  Negative for mass or adenopathy in the neck.  No evidence of abscess.  The tonsils are normal.  The tongue is normal. Epiglottis and larynx are normal.  Parotid and submandibular glands are normal bilaterally.  Cervical disc degeneration and facet degeneration diffusely.  No acute bony abnormality.  IMPRESSION: No acute abnormality.   Original Report Authenticated By: Janeece Riggers, M.D.    Microbiology: Recent Results (from the past 240 hour(s))  CULTURE, BLOOD (ROUTINE X 2)     Status: None   Collection Time    03/14/13  8:12 PM      Result Value Range Status   Specimen Description BLOOD WRIST RIGHT   Final   Special Requests BOTTLES DRAWN AEROBIC AND ANAEROBIC 5CC   Final   Culture  Setup Time 03/15/2013 02:46   Final   Culture     Final   Value:        BLOOD CULTURE RECEIVED NO GROWTH TO DATE CULTURE WILL BE HELD FOR 5 DAYS BEFORE ISSUING A FINAL NEGATIVE REPORT   Report Status PENDING   Incomplete  URINE CULTURE     Status: None   Collection Time    03/14/13  8:12 PM      Result Value Range Status   Specimen  Description URINE, RANDOM   Final   Special Requests NONE   Final   Culture  Setup Time 03/14/2013 21:13   Final   Colony Count 60,000 COLONIES/ML   Final   Culture     Final   Value: STAPHYLOCOCCUS SPECIES (COAGULASE NEGATIVE)     Note: RIFAMPIN AND GENTAMICIN SHOULD NOT BE USED AS SINGLE DRUGS FOR TREATMENT OF STAPH INFECTIONS.   Report Status 03/18/2013 FINAL   Final   Organism ID, Bacteria STAPHYLOCOCCUS SPECIES (COAGULASE NEGATIVE)   Final  CULTURE, BLOOD (ROUTINE X 2)     Status: None   Collection Time    03/14/13  8:55 PM      Result Value Range Status   Specimen Description BLOOD BLOOD RIGHT FOREARM   Final   Special Requests BOTTLES DRAWN AEROBIC AND ANAEROBIC 5CC   Final   Culture  Setup Time 03/15/2013 02:46   Final   Culture     Final   Value:        BLOOD CULTURE RECEIVED NO GROWTH TO DATE CULTURE WILL BE HELD FOR 5 DAYS BEFORE ISSUING A FINAL NEGATIVE REPORT   Report Status PENDING   Incomplete     Labs: Basic Metabolic Panel:  Recent Labs Lab 03/14/13 2012 03/15/13 0400 03/16/13 0645 03/19/13 0419  NA 135 133* 133* 133*  K 4.0 3.6 3.7 3.8  CL 100 102 104 101  CO2 23 20 20 21   GLUCOSE 117* 122* 101* 97  BUN 29* 25* 27* 19  CREATININE 1.28 1.19 1.36* 1.02  CALCIUM 9.1 8.0* 8.1* 9.5   Liver Function Tests:  Recent Labs Lab 03/14/13 2012 03/16/13 0645  AST 29 41*  ALT 9 16  ALKPHOS 128* 128*  BILITOT 0.7 0.7  PROT 8.0 6.5  ALBUMIN 3.5 2.7*   No results found for this basename: LIPASE, AMYLASE,  in the last 168 hours No results found for this basename: AMMONIA,  in the last 168 hours CBC:  Recent Labs Lab 03/14/13 2012 03/15/13 0400 03/16/13 0645 03/17/13 0428 03/18/13 0543  WBC 6.5 7.5 4.6 3.9* 4.8  HGB 13.6 12.4* 11.8* 11.9* 11.9*  HCT 40.8 36.8* 35.2* 35.4* 35.4*  MCV 96.0  94.6 94.6 94.7 93.4  PLT 167 142* 141* 156 184   Cardiac Enzymes: No results found for this basename: CKTOTAL, CKMB, CKMBINDEX, TROPONINI,  in the last 168  hours BNP: BNP (last 3 results) No results found for this basename: PROBNP,  in the last 8760 hours CBG:  Recent Labs Lab 03/19/13 0200  GLUCAP 101*       SignedRhetta Mura  [facility] 03/19/2013, 8:43 AM

## 2013-03-21 LAB — CULTURE, BLOOD (ROUTINE X 2)

## 2013-04-06 ENCOUNTER — Encounter (HOSPITAL_COMMUNITY): Payer: Self-pay | Admitting: Family Medicine

## 2013-04-06 ENCOUNTER — Observation Stay (HOSPITAL_COMMUNITY)
Admission: EM | Admit: 2013-04-06 | Discharge: 2013-04-07 | Disposition: A | Discharge status: Home / Self Care | Payer: Medicare PPO | Attending: Internal Medicine | Admitting: Internal Medicine

## 2013-04-06 ENCOUNTER — Emergency Department (HOSPITAL_COMMUNITY): Payer: Medicare PPO

## 2013-04-06 DIAGNOSIS — R109 Unspecified abdominal pain: Secondary | ICD-10-CM | POA: Diagnosis present

## 2013-04-06 DIAGNOSIS — A0472 Enterocolitis due to Clostridium difficile, not specified as recurrent: Secondary | ICD-10-CM | POA: Insufficient documentation

## 2013-04-06 DIAGNOSIS — R1032 Left lower quadrant pain: Principal | ICD-10-CM | POA: Insufficient documentation

## 2013-04-06 DIAGNOSIS — R198 Other specified symptoms and signs involving the digestive system and abdomen: Secondary | ICD-10-CM

## 2013-04-06 DIAGNOSIS — I10 Essential (primary) hypertension: Secondary | ICD-10-CM | POA: Diagnosis present

## 2013-04-06 DIAGNOSIS — E039 Hypothyroidism, unspecified: Secondary | ICD-10-CM | POA: Diagnosis present

## 2013-04-06 DIAGNOSIS — I441 Atrioventricular block, second degree: Secondary | ICD-10-CM | POA: Insufficient documentation

## 2013-04-06 DIAGNOSIS — Z79899 Other long term (current) drug therapy: Secondary | ICD-10-CM | POA: Insufficient documentation

## 2013-04-06 DIAGNOSIS — R197 Diarrhea, unspecified: Secondary | ICD-10-CM | POA: Diagnosis present

## 2013-04-06 DIAGNOSIS — R6510 Systemic inflammatory response syndrome (SIRS) of non-infectious origin without acute organ dysfunction: Secondary | ICD-10-CM

## 2013-04-06 DIAGNOSIS — K5900 Constipation, unspecified: Secondary | ICD-10-CM

## 2013-04-06 HISTORY — DX: Unspecified osteoarthritis, unspecified site: M19.90

## 2013-04-06 HISTORY — DX: Unspecified hearing loss, unspecified ear: H91.90

## 2013-04-06 HISTORY — DX: Calculus of kidney: N20.0

## 2013-04-06 HISTORY — DX: Rheumatism, unspecified: M79.0

## 2013-04-06 HISTORY — DX: Systemic inflammatory response syndrome (sirs) of non-infectious origin without acute organ dysfunction: R65.10

## 2013-04-06 LAB — CBC WITH DIFFERENTIAL/PLATELET
Basophils Absolute: 0 10*3/uL (ref 0.0–0.1)
Basophils Relative: 0 % (ref 0–1)
Eosinophils Absolute: 0.1 10*3/uL (ref 0.0–0.7)
Eosinophils Relative: 1 % (ref 0–5)
HCT: 34.6 % — ABNORMAL LOW (ref 39.0–52.0)
Hemoglobin: 11.5 g/dL — ABNORMAL LOW (ref 13.0–17.0)
Lymphocytes Relative: 24 % (ref 12–46)
Lymphs Abs: 1.4 10*3/uL (ref 0.7–4.0)
MCH: 31.6 pg (ref 26.0–34.0)
MCHC: 33.2 g/dL (ref 30.0–36.0)
MCV: 95.1 fL (ref 78.0–100.0)
Monocytes Absolute: 0.4 10*3/uL (ref 0.1–1.0)
Monocytes Relative: 8 % (ref 3–12)
Neutro Abs: 4 10*3/uL (ref 1.7–7.7)
Neutrophils Relative %: 67 % (ref 43–77)
Platelets: 196 10*3/uL (ref 150–400)
RBC: 3.64 MIL/uL — ABNORMAL LOW (ref 4.22–5.81)
RDW: 13.5 % (ref 11.5–15.5)
WBC: 5.9 10*3/uL (ref 4.0–10.5)

## 2013-04-06 LAB — URINALYSIS, ROUTINE W REFLEX MICROSCOPIC
Bilirubin Urine: NEGATIVE
Glucose, UA: NEGATIVE mg/dL
Hgb urine dipstick: NEGATIVE
Ketones, ur: NEGATIVE mg/dL
Leukocytes, UA: NEGATIVE
Nitrite: NEGATIVE
Protein, ur: NEGATIVE mg/dL
Specific Gravity, Urine: 1.013 (ref 1.005–1.030)
Urobilinogen, UA: 0.2 mg/dL (ref 0.0–1.0)
pH: 5.5 (ref 5.0–8.0)

## 2013-04-06 LAB — CBC
HCT: 38.3 % — ABNORMAL LOW (ref 39.0–52.0)
Hemoglobin: 12.8 g/dL — ABNORMAL LOW (ref 13.0–17.0)
MCHC: 33.4 g/dL (ref 30.0–36.0)
RBC: 4.01 MIL/uL — ABNORMAL LOW (ref 4.22–5.81)

## 2013-04-06 LAB — COMPREHENSIVE METABOLIC PANEL
ALT: 6 U/L (ref 0–53)
AST: 18 U/L (ref 0–37)
Albumin: 3.4 g/dL — ABNORMAL LOW (ref 3.5–5.2)
Alkaline Phosphatase: 113 U/L (ref 39–117)
BUN: 27 mg/dL — ABNORMAL HIGH (ref 6–23)
CO2: 22 mEq/L (ref 19–32)
Calcium: 9.1 mg/dL (ref 8.4–10.5)
Chloride: 106 mEq/L (ref 96–112)
Creatinine, Ser: 1.3 mg/dL (ref 0.50–1.35)
GFR calc Af Amer: 52 mL/min — ABNORMAL LOW (ref >90)
GFR calc non Af Amer: 45 mL/min — ABNORMAL LOW (ref >90)
Glucose, Bld: 93 mg/dL (ref 70–99)
Potassium: 4.3 mEq/L (ref 3.5–5.1)
Sodium: 138 mEq/L (ref 135–145)
Total Bilirubin: 0.6 mg/dL (ref 0.3–1.2)
Total Protein: 6.9 g/dL (ref 6.0–8.3)

## 2013-04-06 LAB — CREATININE, SERUM
GFR calc Af Amer: 58 mL/min — ABNORMAL LOW (ref >90)
GFR calc non Af Amer: 50 mL/min — ABNORMAL LOW (ref >90)

## 2013-04-06 LAB — LIPASE, BLOOD: Lipase: 27 U/L (ref 11–59)

## 2013-04-06 MED ORDER — CHLORHEXIDINE GLUCONATE 0.12 % MT SOLN
15.0000 mL | Freq: Two times a day (BID) | OROMUCOSAL | Status: DC
  Administered 2013-04-07: 15 mL via OROMUCOSAL
  Filled 2013-04-06 (×3): qty 15

## 2013-04-06 MED ORDER — HYDROCODONE-ACETAMINOPHEN 5-325 MG PO TABS
1.0000 | ORAL_TABLET | ORAL | Status: DC | PRN
Start: 2013-04-06 — End: 2013-04-07

## 2013-04-06 MED ORDER — ALLOPURINOL 100 MG PO TABS
100.0000 mg | ORAL_TABLET | Freq: Every day | ORAL | Status: DC
  Administered 2013-04-07: 100 mg via ORAL
  Filled 2013-04-06: qty 1

## 2013-04-06 MED ORDER — FENTANYL CITRATE 0.05 MG/ML IJ SOLN
25.0000 ug | Freq: Once | INTRAMUSCULAR | Status: AC
  Administered 2013-04-06: 25 ug via INTRAVENOUS
  Filled 2013-04-06: qty 2

## 2013-04-06 MED ORDER — METRONIDAZOLE 500 MG PO TABS
500.0000 mg | ORAL_TABLET | Freq: Three times a day (TID) | ORAL | Status: DC
  Administered 2013-04-06 – 2013-04-07 (×2): 500 mg via ORAL
  Filled 2013-04-06 (×5): qty 1

## 2013-04-06 MED ORDER — IOHEXOL 300 MG/ML  SOLN
100.0000 mL | Freq: Once | INTRAMUSCULAR | Status: AC | PRN
  Administered 2013-04-06: 100 mL via INTRAVENOUS

## 2013-04-06 MED ORDER — ALBUTEROL SULFATE (5 MG/ML) 0.5% IN NEBU: 2.5000 mg | INHALATION_SOLUTION | RESPIRATORY_TRACT | Status: DC | PRN

## 2013-04-06 MED ORDER — ENOXAPARIN SODIUM 40 MG/0.4ML ~~LOC~~ SOLN
40.0000 mg | SUBCUTANEOUS | Status: DC
  Administered 2013-04-06: 40 mg via SUBCUTANEOUS
  Filled 2013-04-06 (×2): qty 0.4

## 2013-04-06 MED ORDER — ACETAMINOPHEN 325 MG PO TABS: 650.0000 mg | ORAL_TABLET | Freq: Four times a day (QID) | ORAL | Status: DC | PRN

## 2013-04-06 MED ORDER — ASPIRIN 325 MG PO TABS
325.0000 mg | ORAL_TABLET | Freq: Every day | ORAL | Status: DC
  Administered 2013-04-07: 325 mg via ORAL
  Filled 2013-04-06: qty 1

## 2013-04-06 MED ORDER — IOHEXOL 300 MG/ML  SOLN
25.0000 mL | INTRAMUSCULAR | Status: DC
  Administered 2013-04-06: 25 mL via ORAL

## 2013-04-06 MED ORDER — ALUM & MAG HYDROXIDE-SIMETH 200-200-20 MG/5ML PO SUSP: 30.0000 mL | Freq: Four times a day (QID) | ORAL | Status: DC | PRN

## 2013-04-06 MED ORDER — NEBIVOLOL HCL 5 MG PO TABS
5.0000 mg | ORAL_TABLET | Freq: Every day | ORAL | Status: DC
  Filled 2013-04-06: qty 1

## 2013-04-06 MED ORDER — SODIUM CHLORIDE 0.9 % IV SOLN
INTRAVENOUS | Status: DC
  Administered 2013-04-06 – 2013-04-07 (×2): via INTRAVENOUS

## 2013-04-06 MED ORDER — ONDANSETRON HCL 4 MG/2ML IJ SOLN: 4.0000 mg | Freq: Four times a day (QID) | INTRAMUSCULAR | Status: DC | PRN

## 2013-04-06 MED ORDER — NEBIVOLOL HCL 5 MG PO TABS: 5.0000 mg | ORAL_TABLET | Freq: Every day | ORAL | Status: DC

## 2013-04-06 MED ORDER — ACETAMINOPHEN 650 MG RE SUPP: 650.0000 mg | Freq: Four times a day (QID) | RECTAL | Status: DC | PRN

## 2013-04-06 MED ORDER — ONDANSETRON HCL 4 MG PO TABS: 4.0000 mg | ORAL_TABLET | Freq: Four times a day (QID) | ORAL | Status: DC | PRN

## 2013-04-06 MED ORDER — LEVOTHYROXINE SODIUM 50 MCG PO TABS
50.0000 ug | ORAL_TABLET | Freq: Every day | ORAL | Status: DC
  Administered 2013-04-07: 50 ug via ORAL
  Filled 2013-04-06 (×2): qty 1

## 2013-04-06 MED ORDER — BIOTENE DRY MOUTH MT LIQD: 15.0000 mL | Freq: Two times a day (BID) | OROMUCOSAL | Status: DC

## 2013-04-06 MED ORDER — MORPHINE SULFATE 2 MG/ML IJ SOLN: 1.0000 mg | INTRAMUSCULAR | Status: DC | PRN

## 2013-04-06 MED ORDER — ONDANSETRON HCL 4 MG/2ML IJ SOLN
4.0000 mg | Freq: Once | INTRAMUSCULAR | Status: AC
  Administered 2013-04-06: 4 mg via INTRAVENOUS
  Filled 2013-04-06: qty 2

## 2013-04-06 NOTE — H&P (Signed)
PATIENT DETAILS Name: Mario May Age: 79 y.o. Sex: male Date of Birth: Aug 19, 1915 Admit Date: 04/06/2013 ZOX:WRUEAV,[id number] ROBERT, MD   CHIEF COMPLAINT:  Lower abdominal pain-since this morning  HPI: Mario May is a 79 y.o. male with a Past Medical History of hypertension, hypothyroidism who presents today with the above noted complaint. Apparently, patient has been having loose stools for the past one week he did this morning he had lower abdominal pain that was 8/10 at its worst, without radiation, without any particular aggravating factors, relieved by fentanyl that he received here in the emergency room. There was no associated nausea or vomiting. No associated fever. The patient presented to the emergency room, where a CT scan of the abdomen was done which did not show any significant abnormalities. I was asked to admit this patient for further evaluation and overnight observation of his abdominal pain. There is a history of shortness of breath or chest pain. There is no history of headache No history of dysuria.   ALLERGIES:   Allergies  Allergen Reactions  . Codeine Other (See Comments)    Passes out    PAST MEDICAL HISTORY: Past Medical History  Diagnosis Date  . Hypothyroidism   . Gout   . Constipation   . Hypertension     PAST SURGICAL HISTORY: Past Surgical History  Procedure Laterality Date  . None      MEDICATIONS AT HOME: Prior to Admission medications   Medication Sig Start Date End Date Taking? Authorizing Provider  allopurinol (ZYLOPRIM) 100 MG tablet Take 100 mg by mouth daily.   Yes Historical Provider, MD  aspirin 325 MG tablet Take 325 mg by mouth daily.   Yes Historical Provider, MD  ibuprofen (ADVIL,MOTRIN) 200 MG tablet Take 800 mg by mouth every 6 (six) hours as needed for pain.   Yes Historical Provider, MD  levothyroxine (SYNTHROID, LEVOTHROID) 50 MCG tablet Take 50 mcg by mouth daily before breakfast.   Yes Historical Provider,  MD  meclizine (ANTIVERT) 25 MG tablet Take 25 mg by mouth 3 (three) times daily as needed (inner ear problems).   Yes Historical Provider, MD  nebivolol (BYSTOLIC) 5 MG tablet Take 5 mg by mouth daily.   Yes Historical Provider, MD  traMADol (ULTRAM) 50 MG tablet Take 1 tablet (50 mg total) by mouth every 6 (six) hours. 03/19/13  Yes Rhetta Mura, MD    FAMILY HISTORY: History reviewed. No pertinent family history.  SOCIAL HISTORY:  reports that he has never smoked. He does not have any smokeless tobacco history on file. He reports that he does not drink alcohol or use illicit drugs.  REVIEW OF SYSTEMS:  Constitutional:   No  weight loss, night sweats,  Fevers, chills, fatigue.  HEENT:    No headaches, Difficulty swallowing,Tooth/dental problems,Sore throat,  No sneezing, itching, ear ache, nasal congestion, post nasal drip,   Cardio-vascular: No chest pain,  Orthopnea, PND, swelling in lower extremities, anasarca,  dizziness, palpitations  GI:  No heartburn, indigestion, nausea, vomiting, diarrhea, change in bowel habits, loss of appetite  Resp: No shortness of breath with exertion or at rest.  No excess mucus, no productive cough, No non-productive cough,  No coughing up of blood.No change in color of mucus.No wheezing.No chest wall deformity  Skin:  no rash or lesions.  GU:  no dysuria, change in color of urine, no urgency or frequency.  No flank pain.  Musculoskeletal: No joint pain or swelling.  No decreased range of motion.  No back pain.  Psych: No change in mood or affect. No depression or anxiety.  No memory loss.   PHYSICAL EXAM: Blood pressure 152/67, pulse 52, temperature 97.3 F (36.3 C), resp. rate 18, SpO2 96.00%.  General appearance :Awake, alert, not in any distress. Speech Clear. Not toxic Looking HEENT: Atraumatic and Normocephalic, pupils equally reactive to light and accomodation Neck: supple, no JVD. No cervical lymphadenopathy.  Chest:Good  air entry bilaterally, no added sounds  CVS: S1 S2 regular, no murmurs.  Abdomen: Bowel sounds present, very mild pelvic tenderness and lower abdominal tenderness without any rigidity or rebound or guarding. (Please note patient exam after IV fentanyl given) no abdominal distention Extremities: B/L Lower Ext shows no edema, both legs are warm to touch Neurology: Awake alert, and oriented X 3, CN II-XII intact, Non focal Skin:No Rash Wounds:N/A  LABS ON ADMISSION:   Recent Labs  04/06/13 1326  NA 138  K 4.3  CL 106  CO2 22  GLUCOSE 93  BUN 27*  CREATININE 1.30  CALCIUM 9.1    Recent Labs  04/06/13 1326  AST 18  ALT 6  ALKPHOS 113  BILITOT 0.6  PROT 6.9  ALBUMIN 3.4*    Recent Labs  04/06/13 1326  LIPASE 27    Recent Labs  04/06/13 1326  WBC 5.9  NEUTROABS 4.0  HGB 11.5*  HCT 34.6*  MCV 95.1  PLT 196   No results found for this basename: CKTOTAL, CKMB, CKMBINDEX, TROPONINI,  in the last 72 hours No results found for this basename: DDIMER,  in the last 72 hours No components found with this basename: POCBNP,    RADIOLOGIC STUDIES ON ADMISSION: Ct Abdomen Pelvis W Contrast  04/06/2013   *RADIOLOGY REPORT*  Clinical Data: Abdominal pain constipation.  CT ABDOMEN AND PELVIS WITH CONTRAST  Technique:  Multidetector CT imaging of the abdomen and pelvis was performed following the standard protocol during bolus administration of intravenous contrast.  Contrast:  100 ml Omnipaque-300  Comparison: 07/21/2009  Findings: No focal abnormalities seen in the liver or spleen.  The stomach, duodenum, pancreas, gallbladder, and adrenal glands are unremarkable.  3.1 cm right upper pole renal lesion is compatible with a cyst.  13 mm interpolar left renal lesion is also compatible with a cyst.  No abdominal aortic aneurysm.  No free fluid or lymphadenopathy in the abdomen.  Imaging through the pelvis shows no free intraperitoneal fluid. There is no pelvic sidewall lymphadenopathy.   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  Left sided bladder diverticulum is noted.  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  No substantial diverticular changes in the colon.  No colonic diverticulitis. Terminal ileum is normal.  Bilateral inguinal hernias contain only fat.  Bone windows reveal no worrisome lytic or sclerotic osseous lesions.  Bilateral pars interarticularis defects are seen at the L5 level.  IMPRESSION: No acute findings in the abdomen or pelvis.   Original Report Authenticated By: Kennith Center, M.D.    ASSESSMENT AND PLAN: Present on Admission:  . Abdominal pain - Uncertain etiology.  - I reviewed the CT scan of the abdomen with Dr. Rito Ehrlich- radiology on call, appendix is a small and normal, no significant bowel abnormalities, mild sigmoid wall thickening suggestive of? Colitis. Large bowel is mostly collapsed. No other abnormalities seen.  - Given above findings of mild sigmoid wall thickening, and and history of recent diarrhea-? C. difficile colitis and resultant abdominal  pain. Will check stool for C. difficile PCR, empirically started on Flagyl.  - I will admit for overnight observation, will keep n.p.o. overnight and monitor him clinically. We will reassess tomorrow, if no significant abdominal pain and can advance diet and discharged home.   . Diarrhea - Poor historian, but looks like he has had antibiotics in the recent past, I will check a C. difficile PCR, empirically started on oral Flagyl for now.   Marland Kitchen HTN (hypertension) - Continue home medications.   Marland Kitchen HYPOTHYROIDISM - Continue levothyroxine  Further plan will depend as patient's clinical course evolves and further radiologic and laboratory data become available. Patient will be monitored closely.  Above plan was discussed with the patient, spouse and daughter at bedside, they are in agreement for overnight observation and reassessment tomorrow. If no  significant changes, possible discharge tomorrow morning.   DVT Prophylaxis: Prophylactic Lovenox   Code Status: Full Code  Total time spent for admission equals 45 minutes.  [facility] Pager [phone]  If 7PM-7AM, please contact night-coverage [email or web address] Password [facility] 04/06/2013, 5:54 PM

## 2013-04-06 NOTE — Progress Notes (Signed)
Notified Claiborne Billings, NP that patient running SB on tele, HR is the 40-50's, pt running 1st degree HB. Patient is asymptomatic. Claiborne Billings, NP stated to call MD if HR less than 40. Will continue to monitor patient. Nelda Marseille, RN

## 2013-04-06 NOTE — ED Notes (Signed)
Per pt sts has been having diarrhea for over a week since started on some new medications. sts hasn't had a normal BM since and now he thinks he is constipated. Hx of the same. sts sharp stabbing abdominal pain. Pt in a lot of pain at triage.

## 2013-04-06 NOTE — ED Provider Notes (Signed)
3:41 PM Assumed care of patient at change of shift pending CT abd/pelvis.  Patient p/w intermittent sharp lower abdominal pain and 1 week of diarrhea, now constipation.  Labs unremarkable. Plan is for CT abd/pelvis.  If negative, pt may be d/c home.  If positive, (suspecting diverticulitis), pt to be admitted to the hospital.    Pt admitted by Junius Finner.    Trixie Dredge, PA-C 04/06/13 2059

## 2013-04-06 NOTE — Progress Notes (Signed)
Admitting nurse paged. Will admit patient as soon as possible. Madelin Rear, MSN, RN, [employer]

## 2013-04-06 NOTE — ED Notes (Signed)
CT notified pt finished first cup of contrast.

## 2013-04-06 NOTE — Progress Notes (Signed)
NURSING PROGRESS NOTE  Mario May [id number] Admission Data: 04/06/2013 6:13 PM Attending Provider: Maretta Bees, MD NWG:[id number] ROBERT, MD Code Status: full   Mario May is a 79 y.o. male patient admitted from ED:  -No acute distress noted.  -No complaints of shortness of breath.  -No complaints of chest pain.   Cardiac Monitoring: Box # [id number] in place. Cardiac monitor yields:sinus bradycardia, with first degree heart block.  Blood pressure 152/67, pulse 52, temperature 97.3 F (36.3 C), resp. rate 18, SpO2 96.00%.   IV Fluids:  IV in place, occlusive dsg intact without redness, IV cath antecubital right, condition patent and no redness none.   Allergies:  Codeine  Past Medical History:   has a past medical history of Hypothyroidism; Gout; Constipation; and Hypertension.  Past Surgical History:   has past surgical history that includes none.  Social History:   reports that he has never smoked. He does not have any smokeless tobacco history on file. He reports that he does not drink alcohol or use illicit drugs.  Skin: intact  Patient/Family orientated to room. Information packet given to patient/family. Admission inpatient armband information verified with patient/family to include name and date of birth and placed on patient arm. Side rails up x 2, fall assessment and education completed with patient/family. Patient/family able to verbalize understanding of risk associated with falls and verbalized understanding to call for assistance before getting out of bed. Call light within reach. Patient/family able to voice and demonstrate understanding of unit orientation instructions.    Will continue to evaluate and treat per MD orders.   Madelin Rear, MSN, RN, [employer]

## 2013-04-06 NOTE — ED Provider Notes (Signed)
History    CSN: [id number] Arrival date & time 04/06/13  1204  First MD Initiated Contact with Patient 04/06/13 1227     Chief Complaint  Patient presents with  . Constipation   (Consider location/radiation/quality/duration/timing/severity/associated sxs/prior Treatment) HPI Pt is a 79yo male presenting today with lower abdominal pain that is sharp and stabbing, 10/10 at triage.  Pain subsided upon entering exam room.  Pt states he was recently admitted for a staph infection.  States some of his medication was changed around.  Since then, has been experiencing intermittent lower abodminal pain.  Feels like he has to use the bathroom but has only been able to go once after trying multiple times yesterday and today.  Wife states she thinks she saw blood in the toilet this morning.  Denies fever, n/v/d.     Past Medical History  Diagnosis Date  . Hypothyroidism   . Gout   . Constipation   . Hypertension    Past Surgical History  Procedure Laterality Date  . None     History reviewed. No pertinent family history. History  Substance Use Topics  . Smoking status: Never Smoker   . Smokeless tobacco: Not on file  . Alcohol Use: No    Review of Systems  Constitutional: Negative for fever, chills and fatigue.  Gastrointestinal: Positive for abdominal pain and blood in stool. Negative for nausea, vomiting and diarrhea.  All other systems reviewed and are negative.    Allergies  Codeine  Home Medications   Current Outpatient Rx  Name  Route  Sig  Dispense  Refill  . allopurinol (ZYLOPRIM) 100 MG tablet   Oral   Take 100 mg by mouth daily.         Marland Kitchen aspirin 325 MG tablet   Oral   Take 325 mg by mouth daily.         Marland Kitchen ibuprofen (ADVIL,MOTRIN) 200 MG tablet   Oral   Take 800 mg by mouth every 6 (six) hours as needed for pain.         Marland Kitchen levothyroxine (SYNTHROID, LEVOTHROID) 50 MCG tablet   Oral   Take 50 mcg by mouth daily before breakfast.         .  meclizine (ANTIVERT) 25 MG tablet   Oral   Take 25 mg by mouth 3 (three) times daily as needed (inner ear problems).         . nebivolol (BYSTOLIC) 5 MG tablet   Oral   Take 5 mg by mouth daily.         . traMADol (ULTRAM) 50 MG tablet   Oral   Take 1 tablet (50 mg total) by mouth every 6 (six) hours.   30 tablet   0    BP 142/81  Pulse 57  Temp(Src) 97.3 F (36.3 C)  Resp 18  SpO2 100% Physical Exam  Nursing note and vitals reviewed. Constitutional: He appears well-developed and well-nourished.  HENT:  Head: Normocephalic and atraumatic.  Eyes: Conjunctivae are normal. No scleral icterus.  Neck: Normal range of motion.  Cardiovascular: Normal rate, regular rhythm and normal heart sounds.   Pulmonary/Chest: Effort normal and breath sounds normal. No respiratory distress. He has no wheezes. He has no rales. He exhibits no tenderness.  Abdominal: Soft. Bowel sounds are normal. He exhibits no distension and no mass. There is tenderness ( lower ). There is no rebound and no guarding.  Genitourinary: Guaiac positive stool ( bloody mucous in diapper ).  Musculoskeletal: Normal range of motion.  Neurological: He is alert.  Skin: Skin is warm and dry.    ED Course  Procedures (including critical care time) Labs Reviewed  CBC WITH DIFFERENTIAL - Abnormal; Notable for the following:    RBC 3.64 (*)    Hemoglobin 11.5 (*)    HCT 34.6 (*)    All other components within normal limits  COMPREHENSIVE METABOLIC PANEL - Abnormal; Notable for the following:    BUN 27 (*)    Albumin 3.4 (*)    GFR calc non Af Amer 45 (*)    GFR calc Af Amer 52 (*)    All other components within normal limits  LIPASE, BLOOD  URINALYSIS, ROUTINE W REFLEX MICROSCOPIC   Ct Abdomen Pelvis W Contrast  04/06/2013   *RADIOLOGY REPORT*  Clinical Data: Abdominal pain constipation.  CT ABDOMEN AND PELVIS WITH CONTRAST  Technique:  Multidetector CT imaging of the abdomen and pelvis was performed following  the standard protocol during bolus administration of intravenous contrast.  Contrast:  100 ml Omnipaque-300  Comparison: 07/21/2009  Findings: No focal abnormalities seen in the liver or spleen.  The stomach, duodenum, pancreas, gallbladder, and adrenal glands are unremarkable.  3.1 cm right upper pole renal lesion is compatible with a cyst.  13 mm interpolar left renal lesion is also compatible with a cyst.  No abdominal aortic aneurysm.  No free fluid or lymphadenopathy in the abdomen.  Imaging through the pelvis shows no free intraperitoneal fluid. There is no pelvic sidewall lymphadenopathy.  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  Left sided bladder diverticulum is noted.  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  No substantial diverticular changes in the colon.  No colonic diverticulitis. Terminal ileum is normal.  Bilateral inguinal hernias contain only fat.  Bone windows reveal no worrisome lytic or sclerotic osseous lesions.  Bilateral pars interarticularis defects are seen at the L5 level.  IMPRESSION: No acute findings in the abdomen or pelvis.   Original Report Authenticated By: Kennith Center, M.D.   1. Abdominal pain     MDM  DDx: volvulus, diverticulitis, constipation.  Pt c/o severe lower abdominal pain.  Discussed pt with Dr. Ignacia Palma, will order CBC, CMP, Lipase, UA, and CT abd.  Labs and CT unremarkable.  4:22 PM Went to speak with pt who is not doubled over in pain, holding his abdomen.  Will have Chad PA-C take over pt care.  Pt is to be admitted for pain control and observation.  No obvious cause of pt's pain. Vitals: stable.  Consulting [facility].  Spoke with Dr. Jerral Ralph, [facility], who agreed to admit pt to tele obs team 10.       Junius Finner, PA-C 04/06/13 1622

## 2013-04-07 DIAGNOSIS — A0472 Enterocolitis due to Clostridium difficile, not specified as recurrent: Secondary | ICD-10-CM

## 2013-04-07 LAB — DIFFERENTIAL
Basophils Absolute: 0 10*3/uL (ref 0.0–0.1)
Eosinophils Absolute: 0.1 10*3/uL (ref 0.0–0.7)
Eosinophils Relative: 2 % (ref 0–5)
Monocytes Absolute: 0.5 10*3/uL (ref 0.1–1.0)

## 2013-04-07 LAB — COMPREHENSIVE METABOLIC PANEL
ALT: 6 U/L (ref 0–53)
Alkaline Phosphatase: 124 U/L — ABNORMAL HIGH (ref 39–117)
CO2: 21 mEq/L (ref 19–32)
Chloride: 105 mEq/L (ref 96–112)
GFR calc Af Amer: 57 mL/min — ABNORMAL LOW (ref >90)
GFR calc non Af Amer: 49 mL/min — ABNORMAL LOW (ref >90)
Glucose, Bld: 79 mg/dL (ref 70–99)
Potassium: 4.3 mEq/L (ref 3.5–5.1)
Sodium: 138 mEq/L (ref 135–145)
Total Bilirubin: 0.7 mg/dL (ref 0.3–1.2)

## 2013-04-07 LAB — CBC
Hemoglobin: 12.5 g/dL — ABNORMAL LOW (ref 13.0–17.0)
MCH: 32.6 pg (ref 26.0–34.0)
RBC: 3.84 MIL/uL — ABNORMAL LOW (ref 4.22–5.81)
WBC: 5.1 10*3/uL (ref 4.0–10.5)

## 2013-04-07 LAB — CLOSTRIDIUM DIFFICILE BY PCR: Toxigenic C. Difficile by PCR: POSITIVE — AB

## 2013-04-07 MED ORDER — AMLODIPINE BESYLATE 10 MG PO TABS: 10.0000 mg | ORAL_TABLET | Freq: Every day | ORAL | Status: DC

## 2013-04-07 MED ORDER — SACCHAROMYCES BOULARDII 250 MG PO CAPS: 250.0000 mg | ORAL_CAPSULE | Freq: Two times a day (BID) | ORAL | Status: DC

## 2013-04-07 MED ORDER — METRONIDAZOLE 500 MG PO TABS: 500.0000 mg | ORAL_TABLET | Freq: Three times a day (TID) | ORAL | Status: DC

## 2013-04-07 NOTE — ED Provider Notes (Signed)
Medical screening examination/treatment/procedure(s) were conducted as a shared visit with non-physician practitioner(s) and myself.  I personally evaluated the patient during the encounter 79 yo man with LLQ pain, Exam showing LLQ tenderness.  Lab workup did not show cause of his pain.  Advised admission for observation of his abdominal pain.  Carleene Cooper III, MD 04/07/13 [phone]

## 2013-04-07 NOTE — Progress Notes (Signed)
NURSING PROGRESS NOTE  Mario May [id number] Discharge Data: 04/07/2013 12:46 PM Attending Provider: Maretta Bees, MD WUJ:WJXBJY,NWGNFAO Molly Maduro, MD     Lynne Logan to be D/C'd Home per MD order.  Discussed with the patient the After Visit Summary and all questions fully answered. All IV's discontinued with no bleeding noted. All belongings returned to patient for patient to take home.   Last Vital Signs:  Blood pressure 165/74, pulse 50, temperature 98.2 F (36.8 C), temperature source Oral, resp. rate 18, height 5\' 11"  (1.803 m), weight 86.183 kg (190 lb), SpO2 98.00%.  Discharge Medication List   Medication List    STOP taking these medications       nebivolol 5 MG tablet  Commonly known as:  BYSTOLIC      TAKE these medications       allopurinol 100 MG tablet  Commonly known as:  ZYLOPRIM  Take 100 mg by mouth daily.     amLODipine 10 MG tablet  Commonly known as:  NORVASC  Take 1 tablet (10 mg total) by mouth daily.     aspirin 325 MG tablet  Take 325 mg by mouth daily.     ibuprofen 200 MG tablet  Commonly known as:  ADVIL,MOTRIN  Take 800 mg by mouth every 6 (six) hours as needed for pain.     levothyroxine 50 MCG tablet  Commonly known as:  SYNTHROID, LEVOTHROID  Take 50 mcg by mouth daily before breakfast.     meclizine 25 MG tablet  Commonly known as:  ANTIVERT  Take 25 mg by mouth 3 (three) times daily as needed (inner ear problems).     metroNIDAZOLE 500 MG tablet  Commonly known as:  FLAGYL  Take 1 tablet (500 mg total) by mouth every 8 (eight) hours.     saccharomyces boulardii 250 MG capsule  Commonly known as:  FLORASTOR  Take 1 capsule (250 mg total) by mouth 2 (two) times daily.     traMADol 50 MG tablet  Commonly known as:  ULTRAM  Take 1 tablet (50 mg total) by mouth every 6 (six) hours.

## 2013-04-07 NOTE — Discharge Summary (Signed)
PATIENT DETAILS Name: Mario May Age: 79 y.o. Sex: male Date of Birth: [birth year] MRN: [id number]. Admit Date: 04/06/2013 Admitting Physician: Maretta Bees, MD WUJ:WJXBJY,NWGNFAO Molly Maduro, MD  Recommendations for Outpatient Follow-up:  1. Avoid unnecessary antibiotics 2. Avoid AV nodal blocking agents-has history of intermittent Mobitz type 1 second degree heart block  PRIMARY DISCHARGE DIAGNOSIS:  Principal Problem:   Abdominal pain Active Problems:   HYPOTHYROIDISM   HTN (hypertension)   Diarrhea      PAST MEDICAL HISTORY: Past Medical History  Diagnosis Date  . Hypothyroidism   . Gout   . Constipation   . Hypertension   . HOH (hard of hearing)   . Arthritis     "just my legs" (04/06/2013)  . Rheumatism   . Kidney stones     "only once" (04/06/2013)  . SIRS (systemic inflammatory response syndrome) 02/2013    Hattie Perch 03/19/2013  (04/06/2013)    DISCHARGE MEDICATIONS:   Medication List    STOP taking these medications       nebivolol 5 MG tablet  Commonly known as:  BYSTOLIC      TAKE these medications       allopurinol 100 MG tablet  Commonly known as:  ZYLOPRIM  Take 100 mg by mouth daily.     amLODipine 10 MG tablet  Commonly known as:  NORVASC  Take 1 tablet (10 mg total) by mouth daily.     aspirin 325 MG tablet  Take 325 mg by mouth daily.     ibuprofen 200 MG tablet  Commonly known as:  ADVIL,MOTRIN  Take 800 mg by mouth every 6 (six) hours as needed for pain.     levothyroxine 50 MCG tablet  Commonly known as:  SYNTHROID, LEVOTHROID  Take 50 mcg by mouth daily before breakfast.     meclizine 25 MG tablet  Commonly known as:  ANTIVERT  Take 25 mg by mouth 3 (three) times daily as needed (inner ear problems).     metroNIDAZOLE 500 MG tablet  Commonly known as:  FLAGYL  Take 1 tablet (500 mg total) by mouth every 8 (eight) hours.     saccharomyces boulardii 250 MG capsule  Commonly known as:  FLORASTOR  Take 1 capsule (250 mg  total) by mouth 2 (two) times daily.     traMADol 50 MG tablet  Commonly known as:  ULTRAM  Take 1 tablet (50 mg total) by mouth every 6 (six) hours.        ALLERGIES:   Allergies  Allergen Reactions  . Codeine Other (See Comments)    Passes out    BRIEF HPI:  See H&P, Labs, Consult and Test reports for all details in brief, patient was admitted for that diarrhea along with abdominal pain.  CONSULTATIONS:   None  PERTINENT RADIOLOGIC STUDIES: Dg Chest 2 View  03/14/2013   *RADIOLOGY REPORT*  Clinical Data: Weakness  CHEST - 2 VIEW  Comparison: 07/21/2009  Findings: Mild cardiomegaly without CHF or pneumonia.  Minimal basilar atelectasis versus scarring.  No effusion or pneumothorax. Atherosclerotic calcification of the aorta.  Trachea is midline. Degenerative changes of the spine.  IMPRESSION: Cardiomegaly without CHF or pneumonia   Original Report Authenticated By: Judie Petit. Miles Costain, M.D.   Ct Soft Tissue Neck W Contrast  03/14/2013   *RADIOLOGY REPORT*  Clinical Data:  Pain behind left ear  CT NECK WITH CONTRAST  Technique:  Multidetector CT imaging of the neck was performed using the standard protocol following the bolus  administration of intravenous contrast.  Contrast: 75mL OMNIPAQUE IOHEXOL 300 MG/ML  SOLN  Comparison:   None  Findings: Majority of the mastoid sinus is visualized and is clear bilaterally.  External auditory canal is patent.  No bony lesion in the skull base.  Negative for mass or adenopathy in the neck.  No evidence of abscess.  The tonsils are normal.  The tongue is normal. Epiglottis and larynx are normal.  Parotid and submandibular glands are normal bilaterally.  Cervical disc degeneration and facet degeneration diffusely.  No acute bony abnormality.  IMPRESSION: No acute abnormality.   Original Report Authenticated By: Janeece Riggers, M.D.   Ct Abdomen Pelvis W Contrast  04/06/2013   *RADIOLOGY REPORT*  Clinical Data: Abdominal pain constipation.  CT ABDOMEN AND PELVIS  WITH CONTRAST  Technique:  Multidetector CT imaging of the abdomen and pelvis was performed following the standard protocol during bolus administration of intravenous contrast.  Contrast:  100 ml Omnipaque-300  Comparison: 07/21/2009  Findings: No focal abnormalities seen in the liver or spleen.  The stomach, duodenum, pancreas, gallbladder, and adrenal glands are unremarkable.  3.1 cm right upper pole renal lesion is compatible with a cyst.  13 mm interpolar left renal lesion is also compatible with a cyst.  No abdominal aortic aneurysm.  No free fluid or lymphadenopathy in the abdomen.  Imaging through the pelvis shows no free intraperitoneal fluid. There is no pelvic sidewall lymphadenopathy.  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  Left sided bladder diverticulum is noted.  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  No substantial diverticular changes in the colon.  No colonic diverticulitis. Terminal ileum is normal.  Bilateral inguinal hernias contain only fat.  Bone windows reveal no worrisome lytic or sclerotic osseous lesions.  Bilateral pars interarticularis defects are seen at the L5 level.  IMPRESSION: No acute findings in the abdomen or pelvis.   Original Report Authenticated By: Kennith Center, M.D.     PERTINENT LAB RESULTS: CBC:  Recent Labs  04/06/13 1847 04/07/13 0535  WBC 6.7 5.1  HGB 12.8* 12.5*  HCT 38.3* 37.1*  PLT 224 190   CMET CMP     Component Value Date/Time   NA 138 04/07/2013 0535   K 4.3 04/07/2013 0535   CL 105 04/07/2013 0535   CO2 21 04/07/2013 0535   GLUCOSE 79 04/07/2013 0535   BUN 20 04/07/2013 0535   CREATININE 1.20 04/07/2013 0535   CALCIUM 9.1 04/07/2013 0535   PROT 6.9 04/07/2013 0535   ALBUMIN 3.2* 04/07/2013 0535   AST 17 04/07/2013 0535   ALT 6 04/07/2013 0535   ALKPHOS 124* 04/07/2013 0535   BILITOT 0.7 04/07/2013 0535   GFRNONAA 49* 04/07/2013 0535   GFRAA 57* 04/07/2013 0535     GFR Estimated Creatinine Clearance: 38.3 ml/min (by C-G formula based on Cr of 1.2).  Recent Labs  04/06/13 1326  LIPASE 27   No results found for this basename: CKTOTAL, CKMB, CKMBINDEX, TROPONINI,  in the last 72 hours No components found with this basename: POCBNP,  No results found for this basename: DDIMER,  in the last 72 hours No results found for this basename: HGBA1C,  in the last 72 hours No results found for this basename: CHOL, HDL, LDLCALC, TRIG, CHOLHDL, LDLDIRECT,  in the last 72 hours No results found for this basename: TSH, T4TOTAL, FREET3, T3FREE, THYROIDAB,  in the last 72 hours No results found for  this basename: VITAMINB12, FOLATE, FERRITIN, TIBC, IRON, RETICCTPCT,  in the last 72 hours Coags: No results found for this basename: PT, INR,  in the last 72 hours Microbiology: Recent Results (from the past 240 hour(s))  CLOSTRIDIUM DIFFICILE BY PCR     Status: Abnormal   Collection Time    04/07/13  8:01 AM      Result Value Range Status   C difficile by pcr POSITIVE (*) NEGATIVE Final   Comment: CRITICAL RESULT CALLED TO, READ BACK BY AND VERIFIED WITH:     ZELLNER R.,RN 04/07/13 1004 BY JONESJ     BRIEF HOSPITAL COURSE:   Principal Problem:   Abdominal pain - Etiology was not very certain, a CT scan of the abdomen done on admission was unrevealing. Upon subsequent discussion with the radiology on call, there was a small area of possible colitis in the sigmoid colon. In any event, patient was admitted, and kept n.p.o. overnight. He did not have any abdominal pain overnight. This morning, he has tolerated a regular diet, his abdomen is very soft and nontender. Given the findings of mild sigmoid wall thickening and intermittent diarrhea, a C. difficile PCR was checked which has come back positive. Patient has already been started on Flagyl since admission. At this time it is presumed that possible colitis may have triggered his abdominal pain. Since the pain has  not recurred, he does not have leukocytosis and a CT scan was essentially unrevealing, he has been deemed stable for discharge.  Active Problems: C. difficile colitis - Did recently have antibiotic therapy. - Plan for 12 more days of Flagyl. - Avoid unnecessary antibiotics - Has had only one bowel movement overnight, suspect he could be discharged home to continue with further antibiotic therapy as an outpatient. Have advised the patient's family to me she he keeps up with fluid, if he were to develop significant diarrhea, he would need to come back to the emergency room. Family is aware.  History of intermittent Mobitz type 1 second degree heart block - Patient was taken off all AV nodal agents last admission, however was started on the Bystolic by his PCP, I have again stopped this and have put him on amlodipine for blood pressure control    HYPOTHYROIDISM - Continue with levothyroxine    HTN (hypertension) - Now on amlodipine. Please see above. Further optimization can be done in the outpatient setting.  TODAY-DAY OF DISCHARGE:  Subjective:   Ellen Mayol today has no headache,no chest abdominal pain,no new weakness tingling or numbness, feels much better wants to go home today. She has had no further abdominal pain, has had only one loose stool since admission.  Objective:   Blood pressure 165/74, pulse 50, temperature 98.2 F (36.8 C), temperature source Oral, resp. rate 18, height 5\' 11"  (1.803 m), weight 86.183 kg (190 lb), SpO2 98.00%.  Intake/Output Summary (Last 24 hours) at 04/07/13 1202 Last data filed at 04/07/13 0900  Gross per 24 hour  Intake 1096.25 ml  Output    800 ml  Net 296.25 ml   Filed Weights   04/06/13 1819  Weight: 86.183 kg (190 lb)    Exam Awake Alert, Oriented *3, No new F.N deficits, Normal affect [place].AT,PERRAL Supple Neck,No JVD, No cervical lymphadenopathy appriciated.  Symmetrical Chest wall movement, Good air movement bilaterally,  CTAB RRR,No Gallops,Rubs or new Murmurs, No Parasternal Heave +ve B.Sounds, Abd Soft, Non tender, No organomegaly appriciated, No rebound -guarding or rigidity. No Cyanosis, Clubbing or edema, No  new Rash or bruise  DISCHARGE CONDITION: Stable  DISPOSITION: Home  DISCHARGE INSTRUCTIONS:    Activity:  As tolerated with Full fall precautions use walker/cane & assistance as needed  Diet recommendation: Heart Healthy diet       Discharge Orders   Future Appointments Provider Department Dept Phone   04/12/2013 8:50 AM Beatrice Lecher, PA-C [facility] Main Office [place]) [phone]   Future Orders Complete By Expires     Call MD for:  persistant nausea and vomiting  As directed     Call MD for:  severe uncontrolled pain  As directed     Diet - low sodium heart healthy  As directed     Increase activity slowly  As directed        Follow-up Information   Follow up with [name], MD. Schedule an appointment as soon as possible for a visit in 1 week.   Contact information:   [address] [facility] [address] [phone]       Total Time spent on discharge equals 45 minutes.  SignedJeoffrey Massed 04/07/2013 12:02 PM

## 2013-04-07 NOTE — Progress Notes (Signed)
Utilization review complete 

## 2013-04-07 NOTE — ED Provider Notes (Signed)
Medical screening examination/treatment/procedure(s) were performed by non-physician practitioner and as supervising physician I was immediately available for consultation/collaboration.   Carleene Cooper III, MD 04/07/13 1051

## 2013-04-12 ENCOUNTER — Encounter: Payer: 59 | Admitting: Physician Assistant

## 2013-05-02 ENCOUNTER — Encounter: Payer: Self-pay | Admitting: Nurse Practitioner

## 2013-05-02 ENCOUNTER — Ambulatory Visit (INDEPENDENT_AMBULATORY_CARE_PROVIDER_SITE_OTHER): Payer: Medicare PPO | Admitting: Nurse Practitioner

## 2013-05-02 VITALS — BP 120/80 | HR 42 | Ht 71.0 in | Wt 192.8 lb

## 2013-05-02 DIAGNOSIS — I441 Atrioventricular block, second degree: Secondary | ICD-10-CM

## 2013-05-02 DIAGNOSIS — I10 Essential (primary) hypertension: Secondary | ICD-10-CM

## 2013-05-02 NOTE — Patient Instructions (Signed)
Stay on your current medicines  Let us know if you have any dizzy or passing out spells  We will see you back as needed.  Call the [facility] office at [phone] if you have any questions, problems or concerns.

## 2013-05-02 NOTE — Progress Notes (Signed)
Mario May Date of Birth: [birth year] Medical Record #[id number]  History of Present Illness: Mario May is seen back today for a post hospital visit. Seen for Mario May. Has seen Mario May in the remote past (2001/2002). He is 79 years of age. Has HTN and hypothyroidism. Most recently in the hospital in June with fever and weakness. Noted at that time to have some bradycardia and subsequently seen by EP. Felt to have asymptomatic bradycardia/ mobitz I second degree AV block, nonconducted PACs as well as a first degree AV block and occasional mobitz I second degree AV block. He is felt to likely have some degenerative conduction disease, but his QRS was narrow and he was asymptomatic.There was no indication for pacing at that time and his beta blocker was stopped. It was felt that he may however require pacing down the road if symptoms develop. Was hospitalized back in July with abdominal pain - noted to have positive cdiff - started on Flagyl. Apparently when he presented for this last admission, he had been previously placed on Bystolic and this was stopped during this last admission given his conduction disorder and was put on Norvasc for his BP.   He comes back today. Here with his son. Using a walker. Very hard of hearing. No chest pain. Not short of breath. Not dizzy or lightheaded. No passing out spells. More upset that his "legs don't work" and has back pain. No longer on lipid lowering therapy but with no improvement. Actually cut his grass last week.   Current Outpatient Prescriptions  Medication Sig Dispense Refill  . allopurinol (ZYLOPRIM) 100 MG tablet Take 100 mg by mouth daily.      Marland Kitchen amLODipine (NORVASC) 10 MG tablet Take 1 tablet (10 mg total) by mouth daily.  30 tablet  0  . aspirin 325 MG tablet Take 325 mg by mouth daily.      . isosorbide dinitrate (ISORDIL) 10 MG tablet Take 10 mg by mouth 2 (two) times daily.      Marland Kitchen levothyroxine (SYNTHROID, LEVOTHROID) 50 MCG  tablet Take 50 mcg by mouth daily before breakfast.      . meclizine (ANTIVERT) 25 MG tablet Take 25 mg by mouth 3 (three) times daily as needed (inner ear problems).       No current facility-administered medications for this visit.    Allergies  Allergen Reactions  . Codeine Other (See Comments)    Passes out    Past Medical History  Diagnosis Date  . Hypothyroidism   . Gout   . Constipation   . Hypertension   . HOH (hard of hearing)   . Arthritis     "just my legs" (04/06/2013)  . Rheumatism   . Kidney stones     "only once" (04/06/2013)  . SIRS (systemic inflammatory response syndrome) 02/2013    Mario May 03/19/2013  (04/06/2013)    Past Surgical History  Procedure Laterality Date  . Cystoscopy w/ stone manipulation  ?1986  . Nasal fracture surgery      History  Smoking status  . Former Smoker -- 0.50 packs/day  . Types: Cigarettes  Smokeless tobacco  . Never Used    Comment: 04/06/2013 "quit smoking ~ 45-50 yr ago"    History  Alcohol Use  . Yes    Comment: 04/06/2013 "last time I've had any alcohol was 09/12/1945; never had a problem w/it"    History reviewed. No pertinent family history.  Review of Systems: The review of systems  is per the HPI.  All other systems were reviewed and are negative.  Physical Exam: BP 120/80  Pulse 42  Ht 5\' 11"  (1.803 m)  Wt 192 lb 12.8 oz (87.454 kg)  BMI 26.9 kg/m2 Patient is very pleasant and in no acute distress. Skin is warm and dry. Color is normal.  HEENT is unremarkable. Normocephalic/atraumatic. PERRL. Sclera are nonicteric. Neck is supple. No masses. No JVD. Lungs are clear. Cardiac exam shows an irregular rate and rhythm. Abdomen is soft. Extremities are without edema. Gait and ROM are intact. No gross neurologic deficits noted.  LABORATORY DATA:  Lab Results  Component Value Date   WBC 5.1 04/07/2013   HGB 12.5* 04/07/2013   HCT 37.1* 04/07/2013   PLT 190 04/07/2013   GLUCOSE 79 04/07/2013   ALT 6 04/07/2013    AST 17 04/07/2013   NA 138 04/07/2013   K 4.3 04/07/2013   CL 105 04/07/2013   CREATININE 1.20 04/07/2013   BUN 20 04/07/2013   CO2 21 04/07/2013   TSH 1.375 03/15/2013     Assessment / Plan: 1. Intermittent Mobitz type 1 second degree AV block - this is apparent on his EKG with rhythm strips today which has been reviewed by Mario May. Variable rate noted. We will continue with our current plan. No need for PPM at this time. He is to let us know if he has any dizzy/passing out spells. See back prn. Need to avoid ALL AV nodal blocking agents.   2. HTN - now on Norvasc. BP looks good.   3. Advanced age.   Patient is agreeable to this plan and will call if any problems develop in the interim.   Mario Macadamia, RN, ANP-C [facility] [address] [place], [place]  [address]

## 2013-05-07 ENCOUNTER — Encounter: Payer: 59 | Admitting: Nurse Practitioner

## 2013-07-01 ENCOUNTER — Emergency Department (HOSPITAL_COMMUNITY)
Admission: EM | Admit: 2013-07-01 | Discharge: 2013-07-01 | Disposition: A | Discharge status: Home / Self Care | Payer: Medicare PPO | Attending: Emergency Medicine | Admitting: Emergency Medicine

## 2013-07-01 ENCOUNTER — Emergency Department (HOSPITAL_COMMUNITY): Payer: Medicare PPO

## 2013-07-01 ENCOUNTER — Encounter (HOSPITAL_COMMUNITY): Payer: Self-pay | Admitting: Emergency Medicine

## 2013-07-01 DIAGNOSIS — I10 Essential (primary) hypertension: Secondary | ICD-10-CM | POA: Insufficient documentation

## 2013-07-01 DIAGNOSIS — I872 Venous insufficiency (chronic) (peripheral): Secondary | ICD-10-CM | POA: Diagnosis present

## 2013-07-01 DIAGNOSIS — E039 Hypothyroidism, unspecified: Secondary | ICD-10-CM | POA: Insufficient documentation

## 2013-07-01 DIAGNOSIS — M109 Gout, unspecified: Secondary | ICD-10-CM | POA: Insufficient documentation

## 2013-07-01 DIAGNOSIS — Z7982 Long term (current) use of aspirin: Secondary | ICD-10-CM | POA: Insufficient documentation

## 2013-07-01 DIAGNOSIS — Z87442 Personal history of urinary calculi: Secondary | ICD-10-CM | POA: Insufficient documentation

## 2013-07-01 DIAGNOSIS — R21 Rash and other nonspecific skin eruption: Secondary | ICD-10-CM | POA: Insufficient documentation

## 2013-07-01 DIAGNOSIS — I8310 Varicose veins of unspecified lower extremity with inflammation: Secondary | ICD-10-CM | POA: Insufficient documentation

## 2013-07-01 DIAGNOSIS — Z79899 Other long term (current) drug therapy: Secondary | ICD-10-CM | POA: Insufficient documentation

## 2013-07-01 DIAGNOSIS — Z87891 Personal history of nicotine dependence: Secondary | ICD-10-CM | POA: Insufficient documentation

## 2013-07-01 DIAGNOSIS — Z8669 Personal history of other diseases of the nervous system and sense organs: Secondary | ICD-10-CM | POA: Insufficient documentation

## 2013-07-01 LAB — CBC WITH DIFFERENTIAL/PLATELET
Hemoglobin: 12.5 g/dL — ABNORMAL LOW (ref 13.0–17.0)
Lymphs Abs: 1.5 10*3/uL (ref 0.7–4.0)
Monocytes Relative: 8 % (ref 3–12)
Neutro Abs: 3.5 10*3/uL (ref 1.7–7.7)
Neutrophils Relative %: 64 % (ref 43–77)
RBC: 3.83 MIL/uL — ABNORMAL LOW (ref 4.22–5.81)
WBC: 5.5 10*3/uL (ref 4.0–10.5)

## 2013-07-01 LAB — PRO B NATRIURETIC PEPTIDE: Pro B Natriuretic peptide (BNP): 264.9 pg/mL (ref 0–450)

## 2013-07-01 LAB — URINALYSIS W MICROSCOPIC + REFLEX CULTURE
Bilirubin Urine: NEGATIVE
Hgb urine dipstick: NEGATIVE
Nitrite: NEGATIVE
Specific Gravity, Urine: 1.013 (ref 1.005–1.030)
pH: 6 (ref 5.0–8.0)

## 2013-07-01 LAB — BASIC METABOLIC PANEL
BUN: 28 mg/dL — ABNORMAL HIGH (ref 6–23)
Chloride: 102 mEq/L (ref 96–112)
GFR calc Af Amer: 59 mL/min — ABNORMAL LOW (ref >90)
Glucose, Bld: 101 mg/dL — ABNORMAL HIGH (ref 70–99)
Potassium: 4 mEq/L (ref 3.5–5.1)
Sodium: 136 mEq/L (ref 135–145)

## 2013-07-01 NOTE — ED Notes (Signed)
Pt knows that urine is needed 

## 2013-07-01 NOTE — ED Provider Notes (Signed)
CSN: [id number]     Arrival date & time 07/01/13  1602 History   First MD Initiated Contact with Patient 07/01/13 1709     Chief Complaint  Patient presents with  . Cellulitis   (Consider location/radiation/quality/duration/timing/severity/associated sxs/prior Treatment) Patient is a 79 y.o. male presenting with rash. The history is provided by the patient.  Rash Location:  Leg Leg rash location:  L lower leg and R lower leg Quality: painful, redness and swelling   Pain details:    Quality:  Aching   Severity:  Mild   Onset quality:  Gradual   Duration:  1 week   Timing:  Constant   Subjective pain progression: slowly worsening. Severity:  Mild Onset quality:  Gradual Duration:  1 week Timing:  Constant Progression:  Worsening Chronicity:  New Context comment:  Unknown, no new meds Relieved by:  Nothing Worsened by:  Nothing tried Ineffective treatments:  None tried Associated symptoms: no abdominal pain, no diarrhea, no fever, no headaches, no nausea, no shortness of breath and not vomiting     Past Medical History  Diagnosis Date  . Hypothyroidism   . Gout   . Constipation   . Hypertension   . HOH (hard of hearing)   . Arthritis     "just my legs" (04/06/2013)  . Rheumatism   . Kidney stones     "only once" (04/06/2013)  . SIRS (systemic inflammatory response syndrome) 02/2013    Hattie Perch 03/19/2013  (04/06/2013)   Past Surgical History  Procedure Laterality Date  . Cystoscopy w/ stone manipulation  ?1986  . Nasal fracture surgery     History reviewed. No pertinent family history. History  Substance Use Topics  . Smoking status: Former Smoker -- 0.50 packs/day    Types: Cigarettes  . Smokeless tobacco: Never Used     Comment: 04/06/2013 "quit smoking ~ 45-50 yr ago"  . Alcohol Use: Yes     Comment: 04/06/2013 "last time I've had any alcohol was 09/12/1945; never had a problem w/it"    Review of Systems  Constitutional: Negative for fever.  HENT: Negative  for drooling and rhinorrhea.   Eyes: Negative for pain.  Respiratory: Negative for cough and shortness of breath.   Cardiovascular: Negative for chest pain and leg swelling.  Gastrointestinal: Negative for nausea, vomiting, abdominal pain and diarrhea.  Genitourinary: Negative for dysuria and hematuria.  Musculoskeletal: Negative for gait problem and neck pain.  Skin: Positive for rash. Negative for color change.  Neurological: Negative for numbness and headaches.  Hematological: Negative for adenopathy.  Psychiatric/Behavioral: Negative for behavioral problems.  All other systems reviewed and are negative.    Allergies  Codeine  Home Medications   Current Outpatient Rx  Name  Route  Sig  Dispense  Refill  . allopurinol (ZYLOPRIM) 100 MG tablet   Oral   Take 100 mg by mouth daily.         Marland Kitchen amLODipine (NORVASC) 10 MG tablet   Oral   Take 1 tablet (10 mg total) by mouth daily.   30 tablet   0   . aspirin 325 MG tablet   Oral   Take 325 mg by mouth daily.         . isosorbide dinitrate (ISORDIL) 10 MG tablet   Oral   Take 10 mg by mouth 2 (two) times daily.         Marland Kitchen levothyroxine (SYNTHROID, LEVOTHROID) 50 MCG tablet   Oral   Take 50 mcg by  mouth daily before breakfast.         . meclizine (ANTIVERT) 25 MG tablet   Oral   Take 25 mg by mouth 3 (three) times daily as needed (inner ear problems).          BP 138/76  Pulse 74  Temp(Src) 97.8 F (36.6 C) (Oral)  Resp 18  SpO2 97% Physical Exam  Nursing note and vitals reviewed. Constitutional: He is oriented to person, place, and time. He appears well-developed and well-nourished.  HENT:  Head: Normocephalic and atraumatic.  Right Ear: External ear normal.  Left Ear: External ear normal.  Nose: Nose normal.  Mouth/Throat: Oropharynx is clear and moist. No oropharyngeal exudate.  Eyes: Conjunctivae and EOM are normal. Pupils are equal, round, and reactive to light.  Neck: Normal range of motion.  Neck supple.  Cardiovascular: Normal rate, regular rhythm, normal heart sounds and intact distal pulses.  Exam reveals no gallop and no friction rub.   No murmur heard. Pulmonary/Chest: Effort normal and breath sounds normal. No respiratory distress. He has no wheezes.  Abdominal: Soft. Bowel sounds are normal. He exhibits no distension. There is no tenderness. There is no rebound and no guarding.  Musculoskeletal: Normal range of motion. He exhibits edema (mild non-pitting edema of bilateral LE's extending to the knee. 2+ distal pulses. ). He exhibits no tenderness.  Mild ttp of bilateral ankles, calves.   2+ distal pulses.   Neurological: He is alert and oriented to person, place, and time.  Skin: Skin is warm and dry. Rash (fine papular and erythematous rash on dorsal surface of feet and extending up anterior shin to the the knee and halfway up the calf. ) noted.  Psychiatric: He has a normal mood and affect. His behavior is normal.    ED Course  Procedures (including critical care time) Labs Review Labs Reviewed  CBC WITH DIFFERENTIAL  BASIC METABOLIC PANEL  URINALYSIS W MICROSCOPIC + REFLEX CULTURE  PRO B NATRIURETIC PEPTIDE   Imaging Review No results found.  EKG Interpretation     Ventricular Rate:  60 PR Interval:  96 QRS Duration: 83 QT Interval:  441 QTC Calculation: 441 R Axis:   21 Text Interpretation:  Sinus rhythm Ventricular premature complex with 2nd degree A-V block (Mobitz I) No significant change since June 2014            MDM   1. Stasis dermatitis of both legs    5:44 PM 79 y.o. male who presents with painful rash on his bilateral lower teres which began one week ago. The patient also notes mild swelling in his lower extremities which began 2 weeks ago. He has occasional shortness of breath but denies any on exam now. He denies any fevers, vomiting, diarrhea. He is afebrile and vital signs are unremarkable here. Will get screening labwork.  8:29  PM: No systemic sx, non-contrib labs. I suspect stasis dermatitis as a result of his new onset bilateral LE edema. No evidence of CHF. Will recommend patient keep his legs elevated, use compression stockings, and a Foley injury or scratching his lower extremities. I do not think he has a bacterial superinfection of the stasis dermatitis at this time. I will recommend close followup with his primary care provider. The patient recently had C. difficile this summer and his discharge note recommended against antibiotics if they were not absolutely needed. At this time I do not think the patient requires antibiotics, but will have him reexamined by his primary care  provider within the next 24-48 hours. I have discussed the diagnosis/risks/treatment options with the patient and family and believe the pt to be eligible for discharge home to follow-up with pcp in the next 1-2 days. We also discussed returning to the ED immediately if new or worsening sx occur. We discussed the sx which are most concerning (e.g., worsening rash, fever) that necessitate immediate return. Any new prescriptions provided to the patient are listed below.  New Prescriptions   No medications on file     Junius Argyle, MD 07/01/13 2033

## 2013-07-01 NOTE — ED Notes (Signed)
Per pt family pt possible cellulitis to RLE. RLE red and swollen and rash. sts 2 weeks.

## 2013-10-02 ENCOUNTER — Encounter (HOSPITAL_COMMUNITY): Payer: Self-pay | Admitting: Emergency Medicine

## 2013-10-02 ENCOUNTER — Emergency Department (HOSPITAL_COMMUNITY): Payer: Medicare PPO

## 2013-10-02 ENCOUNTER — Emergency Department (HOSPITAL_COMMUNITY)
Admission: EM | Admit: 2013-10-02 | Discharge: 2013-10-03 | Disposition: A | Discharge status: Home / Self Care | Payer: Medicare PPO | Attending: Emergency Medicine | Admitting: Emergency Medicine

## 2013-10-02 DIAGNOSIS — Z8719 Personal history of other diseases of the digestive system: Secondary | ICD-10-CM | POA: Insufficient documentation

## 2013-10-02 DIAGNOSIS — M790 Rheumatism, unspecified: Secondary | ICD-10-CM | POA: Insufficient documentation

## 2013-10-02 DIAGNOSIS — R109 Unspecified abdominal pain: Secondary | ICD-10-CM | POA: Insufficient documentation

## 2013-10-02 DIAGNOSIS — M545 Low back pain, unspecified: Secondary | ICD-10-CM | POA: Insufficient documentation

## 2013-10-02 DIAGNOSIS — Z87442 Personal history of urinary calculi: Secondary | ICD-10-CM | POA: Insufficient documentation

## 2013-10-02 DIAGNOSIS — E039 Hypothyroidism, unspecified: Secondary | ICD-10-CM | POA: Insufficient documentation

## 2013-10-02 DIAGNOSIS — Z7982 Long term (current) use of aspirin: Secondary | ICD-10-CM | POA: Insufficient documentation

## 2013-10-02 DIAGNOSIS — Z87891 Personal history of nicotine dependence: Secondary | ICD-10-CM | POA: Insufficient documentation

## 2013-10-02 DIAGNOSIS — Z885 Allergy status to narcotic agent status: Secondary | ICD-10-CM | POA: Insufficient documentation

## 2013-10-02 DIAGNOSIS — H9190 Unspecified hearing loss, unspecified ear: Secondary | ICD-10-CM | POA: Insufficient documentation

## 2013-10-02 DIAGNOSIS — M797 Fibromyalgia: Secondary | ICD-10-CM | POA: Insufficient documentation

## 2013-10-02 DIAGNOSIS — I10 Essential (primary) hypertension: Secondary | ICD-10-CM | POA: Insufficient documentation

## 2013-10-02 DIAGNOSIS — Z79899 Other long term (current) drug therapy: Secondary | ICD-10-CM | POA: Insufficient documentation

## 2013-10-02 DIAGNOSIS — M129 Arthropathy, unspecified: Secondary | ICD-10-CM | POA: Insufficient documentation

## 2013-10-02 DIAGNOSIS — M109 Gout, unspecified: Secondary | ICD-10-CM | POA: Insufficient documentation

## 2013-10-02 LAB — CBC WITH DIFFERENTIAL/PLATELET
Basophils Absolute: 0 10*3/uL (ref 0.0–0.1)
Basophils Relative: 0 % (ref 0–1)
Eosinophils Absolute: 0.1 10*3/uL (ref 0.0–0.7)
Eosinophils Relative: 2 % (ref 0–5)
HCT: 35.4 % — ABNORMAL LOW (ref 39.0–52.0)
HEMOGLOBIN: 12.1 g/dL — AB (ref 13.0–17.0)
LYMPHS ABS: 1.5 10*3/uL (ref 0.7–4.0)
LYMPHS PCT: 26 % (ref 12–46)
MCH: 32 pg (ref 26.0–34.0)
MCHC: 34.2 g/dL (ref 30.0–36.0)
MCV: 93.7 fL (ref 78.0–100.0)
MONOS PCT: 8 % (ref 3–12)
Monocytes Absolute: 0.5 10*3/uL (ref 0.1–1.0)
NEUTROS PCT: 64 % (ref 43–77)
Neutro Abs: 3.8 10*3/uL (ref 1.7–7.7)
PLATELETS: 210 10*3/uL (ref 150–400)
RBC: 3.78 MIL/uL — ABNORMAL LOW (ref 4.22–5.81)
RDW: 13.1 % (ref 11.5–15.5)
WBC: 5.9 10*3/uL (ref 4.0–10.5)

## 2013-10-02 LAB — URINALYSIS, ROUTINE W REFLEX MICROSCOPIC
Bilirubin Urine: NEGATIVE
GLUCOSE, UA: NEGATIVE mg/dL
HGB URINE DIPSTICK: NEGATIVE
Ketones, ur: NEGATIVE mg/dL
LEUKOCYTES UA: NEGATIVE
Nitrite: NEGATIVE
PH: 6.5 (ref 5.0–8.0)
Protein, ur: NEGATIVE mg/dL
Specific Gravity, Urine: 1.013 (ref 1.005–1.030)
Urobilinogen, UA: 0.2 mg/dL (ref 0.0–1.0)

## 2013-10-02 LAB — POCT I-STAT TROPONIN I: Troponin i, poc: 0 ng/mL (ref 0.00–0.08)

## 2013-10-02 LAB — POCT I-STAT, CHEM 8
BUN: 31 mg/dL — AB (ref 6–23)
CHLORIDE: 106 meq/L (ref 96–112)
CREATININE: 1.2 mg/dL (ref 0.50–1.35)
Calcium, Ion: 1.23 mmol/L (ref 1.13–1.30)
Glucose, Bld: 114 mg/dL — ABNORMAL HIGH (ref 70–99)
HCT: 38 % — ABNORMAL LOW (ref 39.0–52.0)
Hemoglobin: 12.9 g/dL — ABNORMAL LOW (ref 13.0–17.0)
Potassium: 3.8 mEq/L (ref 3.7–5.3)
SODIUM: 141 meq/L (ref 137–147)
TCO2: 22 mmol/L (ref 0–100)

## 2013-10-02 MED ORDER — HYDROMORPHONE HCL PF 1 MG/ML IJ SOLN
0.5000 mg | Freq: Once | INTRAMUSCULAR | Status: AC
  Administered 2013-10-03: 0.5 mg via INTRAVENOUS
  Filled 2013-10-02: qty 1

## 2013-10-02 MED ORDER — MORPHINE SULFATE 2 MG/ML IJ SOLN
2.0000 mg | Freq: Once | INTRAMUSCULAR | Status: AC
  Administered 2013-10-02: 2 mg via INTRAVENOUS
  Filled 2013-10-02: qty 1

## 2013-10-02 NOTE — ED Notes (Signed)
To room via EMS.  Onset yesterday intermittant chest pain 5/10 on pain scale.  Onset this morning mid left of midline back pain 5/10 on pain scale.  EMS BP 141/77.  EKG first degree.  EMS gave ASA 324 mg, NTG x 2.

## 2013-10-02 NOTE — ED Notes (Signed)
Patient transported to X-ray 

## 2013-10-02 NOTE — ED Notes (Signed)
Patient transported to CT 

## 2013-10-02 NOTE — ED Provider Notes (Signed)
CSN: [id number]     Arrival date & time 10/02/13  1903 History   First MD Initiated Contact with Patient 10/02/13 1923     Chief Complaint  Patient presents with  . Chest Pain  . Back Pain   HPI   80 y/o male with history as noted below who presents with cc of back pain and chest pain. His symptoms began earlier in the night last night. The symptoms initially began as mild, dull, achy left lower back pain. He denies any specific injury. He was fine before going to bed. He states that he felt "like I just couldn't get comfortable". Today he states he has been unable to get comfortable no matter what he did. Tonight he developed left sided chest pain. The pain was described as sharp. The pain radiated to his back. He called EMS and they gave ASA and nitroglycerin. After the nitroglycerin the patient had resolution of his chest pain. He is still having intermittent left flank pain.   Past Medical History  Diagnosis Date  . Hypothyroidism   . Gout   . Constipation   . Hypertension   . HOH (hard of hearing)   . Arthritis     "just my legs" (04/06/2013)  . Rheumatism   . Kidney stones     "only once" (04/06/2013)  . SIRS (systemic inflammatory response syndrome) 02/2013    [name] 03/19/2013  (04/06/2013)   Past Surgical History  Procedure Laterality Date  . Cystoscopy w/ stone manipulation  ?1986  . Nasal fracture surgery     History reviewed. No pertinent family history. History  Substance Use Topics  . Smoking status: Former Smoker -- 0.50 packs/day    Types: Cigarettes  . Smokeless tobacco: Never Used     Comment: 04/06/2013 "quit smoking ~ 45-50 yr ago"  . Alcohol Use: Yes     Comment: 04/06/2013 "last time I've had any alcohol was 09/12/1945; never had a problem w/it"   Review of Systems  Constitutional: Negative for fever and chills.  Respiratory: Negative for cough and shortness of breath.   Cardiovascular: Positive for chest pain. Negative for palpitations and leg swelling.   Gastrointestinal: Negative for nausea and vomiting.  Genitourinary: Positive for flank pain.  Musculoskeletal: Positive for back pain.  All other systems reviewed and are negative.   Allergies  Codeine; Grapefruit concentrate; Other; and Pineapple  Home Medications   Current Outpatient Rx  Name  Route  Sig  Dispense  Refill  . allopurinol (ZYLOPRIM) 100 MG tablet   Oral   Take 100 mg by mouth daily.         Marland Kitchen amLODipine (NORVASC) 10 MG tablet   Oral   Take 1 tablet (10 mg total) by mouth daily.   30 tablet   0   . aspirin 325 MG tablet   Oral   Take 325 mg by mouth daily.         . isosorbide dinitrate (ISORDIL) 10 MG tablet   Oral   Take 10 mg by mouth 2 (two) times daily.         Marland Kitchen levothyroxine (SYNTHROID, LEVOTHROID) 50 MCG tablet   Oral   Take 50 mcg by mouth daily before breakfast.         . meclizine (ANTIVERT) 25 MG tablet   Oral   Take 25 mg by mouth 3 (three) times daily as needed (inner ear problems).         . traMADol (ULTRAM) 50 MG  tablet   Oral   Take 1 tablet (50 mg total) by mouth every 6 (six) hours as needed.   15 tablet   0    BP 142/72  Pulse 77  Temp(Src) 97.7 F (36.5 C) (Oral)  Resp 15  SpO2 96% Physical Exam  Constitutional: He is oriented to person, place, and time. He appears well-developed and well-nourished. No distress.  HENT:  Head: Normocephalic and atraumatic.  Mouth/Throat: No oropharyngeal exudate.  Eyes: Conjunctivae are normal. Pupils are equal, round, and reactive to light.  Neck: Normal range of motion. Neck supple.  Cardiovascular: Normal rate and normal heart sounds.  Exam reveals no gallop and no friction rub.   No murmur heard. Pulses:      Radial pulses are 2+ on the right side, and 2+ on the left side.       Dorsalis pedis pulses are 2+ on the right side, and 2+ on the left side.       Posterior tibial pulses are 2+ on the right side, and 2+ on the left side.  Pulmonary/Chest: Effort normal and  breath sounds normal.  Abdominal: Soft. He exhibits no distension. There is no tenderness.  Musculoskeletal: Normal range of motion. He exhibits no edema and no tenderness.  Neurological: He is alert and oriented to person, place, and time. He has normal strength and normal reflexes. No cranial nerve deficit or sensory deficit. Coordination normal. GCS eye subscore is 4. GCS verbal subscore is 5. GCS motor subscore is 6.  Skin: Skin is warm and dry.   ED Course  Procedures (including critical care time) Labs Review Labs Reviewed  CBC WITH DIFFERENTIAL - Abnormal; Notable for the following:    RBC 3.78 (*)    Hemoglobin 12.1 (*)    HCT 35.4 (*)    All other components within normal limits  POCT I-STAT, CHEM 8 - Abnormal; Notable for the following:    BUN 31 (*)    Glucose, Bld 114 (*)    Hemoglobin 12.9 (*)    HCT 38.0 (*)    All other components within normal limits  URINALYSIS, ROUTINE W REFLEX MICROSCOPIC  POCT I-STAT TROPONIN I   Imaging Review Dg Chest 2 View  10/02/2013   CLINICAL DATA:  Chest pain  EXAM: CHEST  2 VIEW  COMPARISON:  07/01/2013  FINDINGS: Cardiac shadow is within normal limits. The lungs are well aerated bilaterally. No focal infiltrate or sizable effusion is noted. Degenerative changes of the thoracic spine are noted.  IMPRESSION: No acute abnormality seen.   Electronically Signed   By: Inez Catalina M.D.   On: 10/02/2013 21:11   Ct Angio Chest W/cm &/or Wo Cm  10/03/2013   CLINICAL DATA:  Left rib pain, no injury.  EXAM: CT ANGIOGRAPHY CHEST, ABDOMEN AND PELVIS  TECHNIQUE: Multidetector CT imaging through the chest, abdomen and pelvis was performed using the standard protocol during bolus administration of intravenous contrast. Multiplanar reconstructed images including MIPs were obtained and reviewed to evaluate the vascular anatomy.  CONTRAST:  115mL OMNIPAQUE IOHEXOL 350 MG/ML SOLN  COMPARISON:  Chest radiograph October 02, 2012 and CT of the abdomen and pelvis  April 06, 2013.  FINDINGS: CTA CHEST FINDINGS  Irregular intimal thickening and calcific atherosclerosis of the 3 vessel aortic arch, eccentric intimal thickening of the descending thoracic aorta. No dissection, aneurysm, periaortic hematoma or contrast extravasation. Though not tailored for evaluation, no central pulmonary arterial filling defects.  The heart is mildly enlarged, mild Coronary  artery calcifications. Pericardium is unremarkable. Mild centrilobular emphysema. 4 mm ground-glass nodule right middle lobe, axial 81/156, below size recommendations for surveillance. Calcified granuloma left lung apex. In addition, right lower lobe 6 mm pulmonary nodule, axial 83/156. No pulmonary masses, focal consolidations or pleural effusions. Bibasilar dependent atelectasis. Tracheobronchial tree is patent. No pneumothorax.  Mildly heterogeneous right thyroid gland with punctate calcification. Soft tissues are nonsuspicious. Osteopenia. Scattered thoracic Schmorl's node without acute thoracic spine fracture nor malalignment.  Review of the MIP images confirms the above findings.  CTA ABDOMEN AND PELVIS FINDINGS  Abdominal aorta is normal in course, mildly ectatic infrarenal aorta to 2.1 cm. Moderate calcific atherosclerosis and intimal thickening. Calcification at the ostia of the renal arteries which are patent. The celiac artery, superior mesenteric artery and inferior mesenteric artery appear patent. No aortic dissection, aneurysm, contrast extravasation or periaortic fluid collections. 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 Intimal thickening and possible dissection in the more distal left internal iliac artery.  The liver, spleen, pancreas, gallbladder and adrenal glands are unremarkable for this angiographic phase.  Small hiatal hernia. The stomach, small and large bowel are normal in course and caliber with  mild amount of retained large bowel stool. Moderate amount fecal material at the level the rectal vault. No intraperitoneal free fluid nor free air.  Kidneys are well located, demonstrating in size symmetric enhancement. No nephrolithiasis, hydronephrosis or solid renal masses. 13 mm left interpolar exophytic low-density cyst, right upper pole 36 mm low-density parapelvic cysts. No nephrolithiasis. Urinary bladder is well distended, the base of bladder invaded by the prostate which measures 5.4 cm, status post apparent TURP. Small left ureterocele. Moderate fat containing inguinal hernias.  Soft tissues are nonsuspicious. Osteopenia. Grade 1 L5-S1 anterolisthesis associated with chronic bilateral L5 pars interarticularis defects and result in severe neural foraminal narrowing  Mild degenerative change of the lumbar spine.  Review of the MIP images confirms the above findings.  IMPRESSION: CTA Chest: Intimal thickening and calcific atherosclerosis of the thoracic aorta without acute vascular process. No rib fracture.  Mild centrilobular emphysema. Calcified granulomas in addition to a 6 mm right lower lobe pulmonary nodule ; If the patient is at high risk for bronchogenic carcinoma, follow-up chest CT at 6-12 months is recommended. If the patient is at low risk for bronchogenic carcinoma, follow-up chest CT at 12 months is recommended. This recommendation follows the consensus statement: Guidelines for Management of Small Pulmonary Nodules Detected on CT Scans: A Statement from the [employer] as published in Radiology 2005;237:395-400.  CTA of the abdomen and pelvis: Intimal thickening and calcific atherosclerosis of the abdominal aorta with mild ectasia, no acute vascular process. 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 Possible left external iliac artery chronic dissection.  No acute intra-abdominal or pelvic process. Moderate amount of stool at the rectal vault without  bowel obstruction.   Electronically Signed   By: Elon Alas   On: 10/03/2013 01:01   Ct Angio Abd/pel W/ And/or W/o  10/03/2013   CLINICAL DATA:  Left rib pain, no injury.  EXAM: CT ANGIOGRAPHY CHEST, ABDOMEN AND PELVIS  TECHNIQUE: Multidetector CT imaging through the chest, abdomen and pelvis was performed using the standard protocol during bolus administration of intravenous contrast. Multiplanar reconstructed images including MIPs were obtained and reviewed to evaluate the vascular anatomy.  CONTRAST:  150mL OMNIPAQUE IOHEXOL 350 MG/ML SOLN  COMPARISON:  Chest  radiograph October 02, 2012 and CT of the abdomen and pelvis April 06, 2013.  FINDINGS: CTA CHEST FINDINGS  Irregular intimal thickening and calcific atherosclerosis of the 3 vessel aortic arch, eccentric intimal thickening of the descending thoracic aorta. No dissection, aneurysm, periaortic hematoma or contrast extravasation. Though not tailored for evaluation, no central pulmonary arterial filling defects.  The heart is mildly enlarged, mild Coronary artery calcifications. Pericardium is unremarkable. Mild centrilobular emphysema. 4 mm ground-glass nodule right middle lobe, axial 81/156, below size recommendations for surveillance. Calcified granuloma left lung apex. In addition, right lower lobe 6 mm pulmonary nodule, axial 83/156. No pulmonary masses, focal consolidations or pleural effusions. Bibasilar dependent atelectasis. Tracheobronchial tree is patent. No pneumothorax.  Mildly heterogeneous right thyroid gland with punctate calcification. Soft tissues are nonsuspicious. Osteopenia. Scattered thoracic Schmorl's node without acute thoracic spine fracture nor malalignment.  Review of the MIP images confirms the above findings.  CTA ABDOMEN AND PELVIS FINDINGS  Abdominal aorta is normal in course, mildly ectatic infrarenal aorta to 2.1 cm. Moderate calcific atherosclerosis and intimal thickening. Calcification at the ostia of the renal  arteries which are patent. The celiac artery, superior mesenteric artery and inferior mesenteric artery appear patent. No aortic dissection, aneurysm, contrast extravasation or periaortic fluid collections. 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 Intimal thickening and possible dissection in the more distal left internal iliac artery.  The liver, spleen, pancreas, gallbladder and adrenal glands are unremarkable for this angiographic phase.  Small hiatal hernia. The stomach, small and large bowel are normal in course and caliber with mild amount of retained large bowel stool. Moderate amount fecal material at the level the rectal vault. No intraperitoneal free fluid nor free air.  Kidneys are well located, demonstrating in size symmetric enhancement. No nephrolithiasis, hydronephrosis or solid renal masses. 13 mm left interpolar exophytic low-density cyst, right upper pole 36 mm low-density parapelvic cysts. No nephrolithiasis. Urinary bladder is well distended, the base of bladder invaded by the prostate which measures 5.4 cm, status post apparent TURP. Small left ureterocele. Moderate fat containing inguinal hernias.  Soft tissues are nonsuspicious. Osteopenia. Grade 1 L5-S1 anterolisthesis associated with chronic bilateral L5 pars interarticularis defects and result in severe neural foraminal narrowing  Mild degenerative change of the lumbar spine.  Review of the MIP images confirms the above findings.  IMPRESSION: CTA Chest: Intimal thickening and calcific atherosclerosis of the thoracic aorta without acute vascular process. No rib fracture.  Mild centrilobular emphysema. Calcified granulomas in addition to a 6 mm right lower lobe pulmonary nodule ; If the patient is at high risk for bronchogenic carcinoma, follow-up chest CT at 6-12 months is recommended. If the patient is at low risk for bronchogenic  carcinoma, follow-up chest CT at 12 months is recommended. This recommendation follows the consensus statement: Guidelines for Management of Small Pulmonary Nodules Detected on CT Scans: A Statement from the [employer] as published in Radiology 2005;237:395-400.  CTA of the abdomen and pelvis: Intimal thickening and calcific atherosclerosis of the abdominal aorta with mild ectasia, no acute vascular process. 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 Possible left external iliac artery chronic dissection.  No acute intra-abdominal or pelvic process. Moderate amount of stool at the rectal vault without bowel obstruction.   Electronically Signed   By: Elon Alas   On: 10/03/2013 01:01    EKG Interpretation    Date/Time:  Tuesday  October 02 2013 19:16:08 EST Ventricular Rate:  72 PR Interval:  361 QRS Duration: 88 QT Interval:  407 QTC Calculation: 445 R Axis:   70 Text Interpretation:  Sinus rhythm Atrial premature complex Prolonged PR interval Confirmed by Jeneen Rinks  MD, [place] ([id number]) on 10/02/2013 11:58:59 PM           MDM   Here with cc of chest pain and back pain. Afebrile. HDS. Benign abdominal exam. Had relief of chest pain after NTG with EMS. EKG without acute ischemic changes. CXR normal. Given flank pain and chest pain a CTA chest/abdomen/pelvis was obtained to evaluate for dissection which revealed no acute abnormality. UA not c/w stone or UTI. Labs unremarkable. Initial troponin negative. Back/Flank pain is likely secondary to muscle spasm. Chest pain is atypical but given age and risk factors medicine was consulted for admission for ACS r/o.    1. Abdominal pain   2. HTN (hypertension)   3. Flank pain       Donita Brooks, MD 10/04/13 [phone]

## 2013-10-03 ENCOUNTER — Emergency Department (HOSPITAL_COMMUNITY): Payer: Medicare PPO

## 2013-10-03 ENCOUNTER — Other Ambulatory Visit (HOSPITAL_COMMUNITY): Payer: Medicare PPO

## 2013-10-03 DIAGNOSIS — I10 Essential (primary) hypertension: Secondary | ICD-10-CM

## 2013-10-03 DIAGNOSIS — R109 Unspecified abdominal pain: Secondary | ICD-10-CM

## 2013-10-03 MED ORDER — IOHEXOL 350 MG/ML SOLN
100.0000 mL | Freq: Once | INTRAVENOUS | Status: AC | PRN
  Administered 2013-10-03: 100 mL via INTRAVENOUS

## 2013-10-03 MED ORDER — TRAMADOL HCL 50 MG PO TABS: 50.0000 mg | ORAL_TABLET | Freq: Four times a day (QID) | ORAL | Status: DC | PRN

## 2013-10-03 MED ORDER — SODIUM CHLORIDE 0.9 % IV SOLN
INTRAVENOUS | Status: DC
  Administered 2013-10-03: 1000 mL via INTRAVENOUS

## 2013-10-03 NOTE — ED Notes (Signed)
Hospitalist MD at bedside. 

## 2013-10-03 NOTE — ED Notes (Signed)
Patient has been transported to CT

## 2013-10-03 NOTE — ED Notes (Signed)
Patient returned from CT

## 2013-10-03 NOTE — ED Provider Notes (Signed)
Patient seen by [facility] hospitalist, Dr. Laren Everts. Believe the patient's pain to be musculoskeletal in nature. He declines to admit the patient and advises discharge home. Patient is currently symptom-free. Troponin x2 is negative. CTA without acute findings. Return precautions have been given.  Julianne Rice, MD 10/03/13 [phone]

## 2013-10-03 NOTE — H&P (Signed)
[facility]                                                                                    Patient Demographics  Mario May, is a 80 y.o. male  CSN: [id number]  MRN: [id number]  DOB - [birth year]  Admit Date - 10/02/2013  Outpatient Primary MD for the patient is Sherrie Mustache, MD   With History of -  Past Medical History  Diagnosis Date  . Hypothyroidism   . Gout   . Constipation   . Hypertension   . HOH (hard of hearing)   . Arthritis     "just my legs" (04/06/2013)  . Rheumatism   . Kidney stones     "only once" (04/06/2013)  . SIRS (systemic inflammatory response syndrome) 02/2013    Mario May 03/19/2013  (04/06/2013)      Past Surgical History  Procedure Laterality Date  . Cystoscopy w/ stone manipulation  ?1986  . Nasal fracture surgery      in for   Chief Complaint  Patient presents with  . Chest Pain  . Back Pain     HPI  Mario May  is a 80 y.o. male, with past medical history significant for hypertension, hypothyroidism and kidney stones presenting today with a one-day history of the left sided CVA pain with no shortness of breath nausea vomiting or cold sweats. This pain lasted for >12 hours . Later on he had some epigastric pain radiating to the abdomen and chest that resolved on its own. No associated nausea vomiting and cold sweats. I was called to admit the patient for rule out MI .    Review of Systems    In addition to the HPI above,  No Fever-chills, No Headache, No changes with Vision or hearing, No problems swallowing food or Liquids, No Chest pain, Cough or Shortness of Breath, No Abdominal pain, No Nausea or Vommitting, Bowel movements are regular, No Blood in stool or Urine, No dysuria, No new skin rashes or bruises, No new joints pains-aches,  No new weakness, tingling, numbness in any extremity, No recent weight gain or loss, No polyuria, polydypsia or polyphagia, No significant Mental  Stressors.  A full 10 point Review of Systems was done, except as stated above, all other Review of Systems were negative.   Social History History  Substance Use Topics  . Smoking status: Former Smoker -- 0.50 packs/day    Types: Cigarettes  . Smokeless tobacco: Never Used     Comment: 04/06/2013 "quit smoking ~ 45-50 yr ago"  . Alcohol Use: Yes     Comment: 04/06/2013 "last time I've had any alcohol was 09/12/1945; never had a problem w/it"     Family History History reviewed. No pertinent family history.   Prior to Admission medications   Medication Sig Start Date End Date Taking? Authorizing Provider  allopurinol (ZYLOPRIM) 100 MG tablet Take 100 mg by mouth daily.   Yes Historical Provider, MD  amLODipine (NORVASC) 10 MG tablet Take 1 tablet (10 mg total) by mouth daily. 04/07/13  Yes Shanker Kristeen Mans, MD  aspirin 325 MG tablet Take 325 mg by mouth daily.  Yes Historical Provider, MD  isosorbide dinitrate (ISORDIL) 10 MG tablet Take 10 mg by mouth 2 (two) times daily.   Yes Historical Provider, MD  levothyroxine (SYNTHROID, LEVOTHROID) 50 MCG tablet Take 50 mcg by mouth daily before breakfast.   Yes Historical Provider, MD  meclizine (ANTIVERT) 25 MG tablet Take 25 mg by mouth 3 (three) times daily as needed (inner ear problems).   Yes Historical Provider, MD    Allergies  Allergen Reactions  . Codeine Other (See Comments)    Passes out  . Grapefruit Concentrate   . Other     Potatoes, green beans, corn  . Pineapple     Physical Exam  Vitals  Blood pressure 144/84, pulse 81, temperature 97.7 F (36.5 C), temperature source Oral, resp. rate 21, SpO2 99.00%.   1. General elderly gentleman in no acute distress very pleasant  2. Normal affect and insight, Not Suicidal or Homicidal, .  3. No F.N deficits, ALL C.Nerves Intact, Strength 5/5 all 4 extremities, Sensation intact all 4 extremities, Plantars down going.  4. Ears and Eyes appear Normal, Conjunctivae  clear, PERRLA. Moist Oral Mucosa.  5. Supple Neck, No JVD, No cervical lymphadenopathy appriciated, No Carotid Bruits.  6. Symmetrical Chest wall movement, Good air movement bilaterally, CTAB.  7. RRR, No Gallops, Rubs or Murmurs, No Parasternal Heave.  8. Positive Bowel Sounds, Abdomen Soft, Non tender, No organomegaly appriciated,No rebound -guarding or rigidity.  9.  No Cyanosis, Normal Skin Turgor, No Skin Rash or Bruise.  10. Good muscle tone,  joints appear normal , no effusions, Normal ROM.  11. No Palpable Lymph Nodes in Neck or Axillae    Data Review  CBC  Recent Labs Lab 10/02/13 2003 10/02/13 2048  WBC 5.9  --   HGB 12.1* 12.9*  HCT 35.4* 38.0*  PLT 210  --   MCV 93.7  --   MCH 32.0  --   MCHC 34.2  --   RDW 13.1  --   LYMPHSABS 1.5  --   MONOABS 0.5  --   EOSABS 0.1  --   BASOSABS 0.0  --    ------------------------------------------------------------------------------------------------------------------  Chemistries   Recent Labs Lab 10/02/13 2048  NA 141  K 3.8  CL 106  GLUCOSE 114*  BUN 31*  CREATININE 1.20   ---------------------------------------------------------------------------------------------------------------  Urinalysis    Component Value Date/Time   COLORURINE YELLOW 10/02/2013 2114   APPEARANCEUR CLEAR 10/02/2013 2114   LABSPEC 1.013 10/02/2013 2114   PHURINE 6.5 10/02/2013 2114   GLUCOSEU NEGATIVE 10/02/2013 2114   HGBUR NEGATIVE 10/02/2013 2114   BILIRUBINUR NEGATIVE 10/02/2013 2114   KETONESUR NEGATIVE 10/02/2013 2114   PROTEINUR NEGATIVE 10/02/2013 2114   UROBILINOGEN 0.2 10/02/2013 2114   NITRITE NEGATIVE 10/02/2013 2114   LEUKOCYTESUR NEGATIVE 10/02/2013 2114    ----------------------------------------------------------------------------------------------------------------  Imaging results:   Dg Chest 2 View  10/02/2013   CLINICAL DATA:  Chest pain  EXAM: CHEST  2 VIEW  COMPARISON:  07/01/2013  FINDINGS: Cardiac shadow  is within normal limits. The lungs are well aerated bilaterally. No focal infiltrate or sizable effusion is noted. Degenerative changes of the thoracic spine are noted.  IMPRESSION: No acute abnormality seen.   Electronically Signed   By: Inez Catalina M.D.   On: 10/02/2013 21:11   Ct Angio Chest W/cm &/or Wo Cm  10/03/2013   CLINICAL DATA:  Left rib pain, no injury.  EXAM: CT ANGIOGRAPHY CHEST, ABDOMEN AND PELVIS  TECHNIQUE: Multidetector CT imaging through the  chest, abdomen and pelvis was performed using the standard protocol during bolus administration of intravenous contrast. Multiplanar reconstructed images including MIPs were obtained and reviewed to evaluate the vascular anatomy.  CONTRAST:  156mL OMNIPAQUE IOHEXOL 350 MG/ML SOLN  COMPARISON:  Chest radiograph October 02, 2012 and CT of the abdomen and pelvis April 06, 2013.  FINDINGS: CTA CHEST FINDINGS  Irregular intimal thickening and calcific atherosclerosis of the 3 vessel aortic arch, eccentric intimal thickening of the descending thoracic aorta. No dissection, aneurysm, periaortic hematoma or contrast extravasation. Though not tailored for evaluation, no central pulmonary arterial filling defects.  The heart is mildly enlarged, mild Coronary artery calcifications. Pericardium is unremarkable. Mild centrilobular emphysema. 4 mm ground-glass nodule right middle lobe, axial 81/156, below size recommendations for surveillance. Calcified granuloma left lung apex. In addition, right lower lobe 6 mm pulmonary nodule, axial 83/156. No pulmonary masses, focal consolidations or pleural effusions. Bibasilar dependent atelectasis. Tracheobronchial tree is patent. No pneumothorax.  Mildly heterogeneous right thyroid gland with punctate calcification. Soft tissues are nonsuspicious. Osteopenia. Scattered thoracic Schmorl's node without acute thoracic spine fracture nor malalignment.  Review of the MIP images confirms the above findings.  CTA ABDOMEN AND PELVIS  FINDINGS  Abdominal aorta is normal in course, mildly ectatic infrarenal aorta to 2.1 cm. Moderate calcific atherosclerosis and intimal thickening. Calcification at the ostia of the renal arteries which are patent. The celiac artery, superior mesenteric artery and inferior mesenteric artery appear patent. No aortic dissection, aneurysm, contrast extravasation or periaortic fluid collections. 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 Intimal thickening and possible dissection in the more distal left internal iliac artery.  The liver, spleen, pancreas, gallbladder and adrenal glands are unremarkable for this angiographic phase.  Small hiatal hernia. The stomach, small and large bowel are normal in course and caliber with mild amount of retained large bowel stool. Moderate amount fecal material at the level the rectal vault. No intraperitoneal free fluid nor free air.  Kidneys are well located, demonstrating in size symmetric enhancement. No nephrolithiasis, hydronephrosis or solid renal masses. 13 mm left interpolar exophytic low-density cyst, right upper pole 36 mm low-density parapelvic cysts. No nephrolithiasis. Urinary bladder is well distended, the base of bladder invaded by the prostate which measures 5.4 cm, status post apparent TURP. Small left ureterocele. Moderate fat containing inguinal hernias.  Soft tissues are nonsuspicious. Osteopenia. Grade 1 L5-S1 anterolisthesis associated with chronic bilateral L5 pars interarticularis defects and result in severe neural foraminal narrowing  Mild degenerative change of the lumbar spine.  Review of the MIP images confirms the above findings.  IMPRESSION: CTA Chest: Intimal thickening and calcific atherosclerosis of the thoracic aorta without acute vascular process. No rib fracture.  Mild centrilobular emphysema. Calcified granulomas in addition to a 6 mm right lower  lobe pulmonary nodule ; If the patient is at high risk for bronchogenic carcinoma, follow-up chest CT at 6-12 months is recommended. If the patient is at low risk for bronchogenic carcinoma, follow-up chest CT at 12 months is recommended. This recommendation follows the consensus statement: Guidelines for Management of Small Pulmonary Nodules Detected on CT Scans: A Statement from the [place] as published in Radiology 2005;237:395-400.  CTA of the abdomen and pelvis: Intimal thickening and calcific atherosclerosis of the abdominal aorta with mild ectasia, no acute vascular process. 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 Possible left external iliac artery chronic dissection.  No acute intra-abdominal or pelvic process. Moderate amount of stool at the rectal vault without bowel obstruction.   Electronically Signed   By: Elon Alas   On: 10/03/2013 01:01   Ct Angio Abd/pel W/ And/or W/o  10/03/2013   CLINICAL DATA:  Left rib pain, no injury.  EXAM: CT ANGIOGRAPHY CHEST, ABDOMEN AND PELVIS  TECHNIQUE: Multidetector CT imaging through the chest, abdomen and pelvis was performed using the standard protocol during bolus administration of intravenous contrast. Multiplanar reconstructed images including MIPs were obtained and reviewed to evaluate the vascular anatomy.  CONTRAST:  112mL OMNIPAQUE IOHEXOL 350 MG/ML SOLN  COMPARISON:  Chest radiograph October 02, 2012 and CT of the abdomen and pelvis April 06, 2013.  FINDINGS: CTA CHEST FINDINGS  Irregular intimal thickening and calcific atherosclerosis of the 3 vessel aortic arch, eccentric intimal thickening of the descending thoracic aorta. No dissection, aneurysm, periaortic hematoma or contrast extravasation. Though not tailored for evaluation, no central pulmonary arterial filling defects.  The heart is mildly enlarged, mild Coronary artery calcifications. Pericardium is unremarkable. Mild centrilobular emphysema. 4  mm ground-glass nodule right middle lobe, axial 81/156, below size recommendations for surveillance. Calcified granuloma left lung apex. In addition, right lower lobe 6 mm pulmonary nodule, axial 83/156. No pulmonary masses, focal consolidations or pleural effusions. Bibasilar dependent atelectasis. Tracheobronchial tree is patent. No pneumothorax.  Mildly heterogeneous right thyroid gland with punctate calcification. Soft tissues are nonsuspicious. Osteopenia. Scattered thoracic Schmorl's node without acute thoracic spine fracture nor malalignment.  Review of the MIP images confirms the above findings.  CTA ABDOMEN AND PELVIS FINDINGS  Abdominal aorta is normal in course, mildly ectatic infrarenal aorta to 2.1 cm. Moderate calcific atherosclerosis and intimal thickening. Calcification at the ostia of the renal arteries which are patent. The celiac artery, superior mesenteric artery and inferior mesenteric artery appear patent. No aortic dissection, aneurysm, contrast extravasation or periaortic fluid collections. 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 Intimal thickening and possible dissection in the more distal left internal iliac artery.  The liver, spleen, pancreas, gallbladder and adrenal glands are unremarkable for this angiographic phase.  Small hiatal hernia. The stomach, small and large bowel are normal in course and caliber with mild amount of retained large bowel stool. Moderate amount fecal material at the level the rectal vault. No intraperitoneal free fluid nor free air.  Kidneys are well located, demonstrating in size symmetric enhancement. No nephrolithiasis, hydronephrosis or solid renal masses. 13 mm left interpolar exophytic low-density cyst, right upper pole 36 mm low-density parapelvic cysts. No nephrolithiasis. Urinary bladder is well distended, the base of bladder invaded by the prostate  which measures 5.4 cm, status post apparent TURP. Small left ureterocele. Moderate fat containing inguinal hernias.  Soft tissues are nonsuspicious. Osteopenia. Grade 1 L5-S1 anterolisthesis associated with chronic bilateral L5 pars interarticularis defects and result in severe neural foraminal narrowing  Mild degenerative change of the lumbar spine.  Review of the MIP images confirms the above findings.  IMPRESSION: CTA Chest: Intimal thickening and calcific atherosclerosis of the thoracic aorta without acute vascular process. No rib fracture.  Mild centrilobular emphysema. Calcified granulomas in addition to a 6 mm right lower lobe pulmonary nodule ; If the patient is at high risk for bronchogenic carcinoma, follow-up chest CT at 6-12 months is recommended. If the patient is at low risk for bronchogenic carcinoma, follow-up chest CT at 12 months is recommended. This recommendation  follows the consensus statement: Guidelines for Management of Small Pulmonary Nodules Detected on CT Scans: A Statement from the [place] as published in Radiology 2005;237:395-400.  CTA of the abdomen and pelvis: Intimal thickening and calcific atherosclerosis of the abdominal aorta with mild ectasia, no acute vascular process. 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 Possible left external iliac artery chronic dissection.  No acute intra-abdominal or pelvic process. Moderate amount of stool at the rectal vault without bowel obstruction.   Electronically Signed   By: Elon Alas   On: 10/03/2013 01:01    My personal review of EKG: No acute changes    Assessment & Plan  1. left flank pain with an episode of epigastric pain radiating to the abdomen and chest.    After reviewing the EKG, all tests and examining the patient, I feel that the pain is musculoskeletal and the patient should followup with his regular medical doctor as outpatient Advised to come back if the chest pain  recurs.     Family Communication: Admission, patients condition and plan of care including tests being ordered have been discussed with the patient and daughter who indicate understanding and agree with the plan and Code Status.  Code Status full  Disposition Plan: Admission declined  Time spent in minutes : 32 minutes  Condition fair

## 2013-10-06 NOTE — ED Provider Notes (Signed)
Medical screening examination/treatment/procedure(s) were performed by non-physician practitioner and as supervising physician I was immediately available for consultation/collaboration.  EKG Interpretation    Date/Time:  Tuesday October 02 2013 19:16:08 EST Ventricular Rate:  72 PR Interval:  361 QRS Duration: 88 QT Interval:  407 QTC Calculation: 445 R Axis:   70 Text Interpretation:  Sinus rhythm Atrial premature complex Prolonged PR interval Confirmed by Jeneen Rinks  MD, Jarratt ([id number]) on 10/02/2013 11:58:59 PM              Tanna Furry, MD 10/06/13 [phone]

## 2014-12-02 ENCOUNTER — Encounter: Payer: Self-pay | Admitting: Internal Medicine

## 2015-01-22 ENCOUNTER — Encounter: Payer: Self-pay | Admitting: Internal Medicine

## 2015-01-22 ENCOUNTER — Ambulatory Visit (INDEPENDENT_AMBULATORY_CARE_PROVIDER_SITE_OTHER): Payer: Medicare PPO | Admitting: Internal Medicine

## 2015-01-22 VITALS — BP 90/50 | HR 64 | Ht 71.0 in | Wt 188.6 lb

## 2015-01-22 DIAGNOSIS — K5900 Constipation, unspecified: Secondary | ICD-10-CM | POA: Diagnosis not present

## 2015-01-22 DIAGNOSIS — K5909 Other constipation: Secondary | ICD-10-CM

## 2015-01-22 DIAGNOSIS — M6208 Separation of muscle (nontraumatic), other site: Secondary | ICD-10-CM

## 2015-01-22 MED ORDER — MAGNESIUM HYDROXIDE 400 MG/5ML PO SUSP: 30.0000 mL | Freq: Every day | ORAL | Status: DC

## 2015-01-22 MED ORDER — BISACODYL 5 MG PO TBEC: 5.0000 mg | DELAYED_RELEASE_TABLET | Freq: Every day | ORAL | Status: DC | PRN

## 2015-01-22 NOTE — Patient Instructions (Addendum)
Take miralax daily.   Use Dulcolax which is over the counter as needed if you haven't had a bowel movement.  You may take 1-2 tablets at a time.   Follow up with Dr. Carlean Purl as needed.    I appreciate the opportunity to care for you. Silvano Rusk, M.D., [facility]

## 2015-01-22 NOTE — Progress Notes (Signed)
Subjective:    Patient ID: Mario May, male    DOB: 07-05-1915, 81 y.o.   MRN: [id number] Chief complaint: Constipation HPI The patient is a very nice very elderly white man with chronic constipation who presents with similar complaints to what I heard in 2010. He is using milk of magnesia and that usually helps him with his bowels but sometimes he has trouble. He does not use it every day but when he takes it works. He is concerned he could have cancer. In general he moves his bowels but sometimes he needs some help with a laxative. He is tried some sort of stool softener without relief. He wants me to check his rectum and his colon. His son is with him today. No bleeding. No unintentional weight loss appetite is good. 35 year old wife at home with him. She can't Lacinda Axon is much as she use to the son says. Eats chicken - some vegetables Still mows grass  Allergies  Allergen Reactions  . Codeine Other (See Comments)    Passes out  . Grapefruit Concentrate   . Other     Potatoes, green beans, corn  . Pineapple    Outpatient Prescriptions Prior to Visit  Medication Sig Dispense Refill  . allopurinol (ZYLOPRIM) 100 MG tablet Take 100 mg by mouth daily.    Marland Kitchen amLODipine (NORVASC) 10 MG tablet Take 1 tablet (10 mg total) by mouth daily. 30 tablet 0  . aspirin 325 MG tablet Take 325 mg by mouth daily.    . isosorbide dinitrate (ISORDIL) 10 MG tablet Take 10 mg by mouth 2 (two) times daily.    Marland Kitchen levothyroxine (SYNTHROID, LEVOTHROID) 50 MCG tablet Take 50 mcg by mouth daily before breakfast.    . meclizine (ANTIVERT) 25 MG tablet Take 25 mg by mouth 3 (three) times daily as needed (inner ear problems).    . traMADol (ULTRAM) 50 MG tablet Take 1 tablet (50 mg total) by mouth every 6 (six) hours as needed. 15 tablet 0   No facility-administered medications prior to visit.   Past Medical History  Diagnosis Date  . Hypothyroidism   . Gout   . Constipation   . Hypertension   . HOH (hard  of hearing)   . Arthritis     "just my legs" (04/06/2013)  . Rheumatism   . Kidney stones     "only once" (04/06/2013)  . SIRS (systemic inflammatory response syndrome) 02/2013    [name] 03/19/2013  (04/06/2013)  . Hyperlipidemia   . Small bowel obstruction   . Anemia    Past Surgical History  Procedure Laterality Date  . Cystoscopy w/ stone manipulation  ?1986  . Nasal fracture surgery    . Transurethral resection of prostate     History   Social History  . Marital Status: Married    Spouse Name: N/A  . Number of Children: 2  . Years of Education: N/A   Social History Main Topics  . Smoking status: Former Smoker -- 0.50 packs/day    Types: Cigarettes  . Smokeless tobacco: Never Used     Comment: 04/06/2013 "quit smoking ~ 45-50 yr ago"  . Alcohol Use: No     Comment: 04/06/2013 "last time I've had any alcohol was 09/12/1945; never had a problem w/it"  . Drug Use: No  . Sexual Activity: Not on file   Other Topics Concern  . None   Social History Narrative   Family History  Problem Relation Age of Onset  .  Colon cancer Mother   . Prostate cancer Father   . Colon polyps Brother     x 2  . Heart disease Brother   . Heart disease Father        Review of Systems HOH - hearing aid    Objective:   Physical Exam BP 90/50 mmHg  Pulse 64  Ht 5\' 11"  (1.803 m)  Wt 188 lb 9.6 oz (85.548 kg)  BMI 26.32 kg/m2 Elderly wm, HOH Abd soft and NT with prominent diastasis recti Rectal:   Anoderm inspection revealed NL  Digital exam revealed normal resting tone and voluntary squeeze. No mass or rectocele present. Simulated defecation with valsalva revealed appropriate abdominal contraction and descent.   Prostate is NL     Assessment & Plan:   1. Chronic constipation   2. Diastasis recti    He is to continue milk of magnesia. He may use intermittent Senokot or Dulcolax. There is no reason to perform any further investigation for these chronic symptoms. I hope I was  able to reassure him today. I think he is doing great for 89.  I appreciate the opportunity to care for this patient. CC: Sherrie Mustache, MD

## 2016-06-04 ENCOUNTER — Emergency Department (HOSPITAL_COMMUNITY): Payer: Medicare Other

## 2016-06-04 ENCOUNTER — Encounter (HOSPITAL_COMMUNITY): Payer: Self-pay | Admitting: Emergency Medicine

## 2016-06-04 ENCOUNTER — Observation Stay (HOSPITAL_COMMUNITY)
Admission: EM | Admit: 2016-06-04 | Discharge: 2016-06-06 | Disposition: A | Discharge status: Home / Self Care | Payer: Medicare Other | Attending: Internal Medicine | Admitting: Internal Medicine

## 2016-06-04 DIAGNOSIS — E039 Hypothyroidism, unspecified: Secondary | ICD-10-CM | POA: Diagnosis not present

## 2016-06-04 DIAGNOSIS — Y999 Unspecified external cause status: Secondary | ICD-10-CM | POA: Diagnosis not present

## 2016-06-04 DIAGNOSIS — D72829 Elevated white blood cell count, unspecified: Secondary | ICD-10-CM | POA: Diagnosis present

## 2016-06-04 DIAGNOSIS — Y929 Unspecified place or not applicable: Secondary | ICD-10-CM | POA: Diagnosis not present

## 2016-06-04 DIAGNOSIS — Y939 Activity, unspecified: Secondary | ICD-10-CM | POA: Diagnosis not present

## 2016-06-04 DIAGNOSIS — R279 Unspecified lack of coordination: Secondary | ICD-10-CM

## 2016-06-04 DIAGNOSIS — M150 Primary generalized (osteo)arthritis: Secondary | ICD-10-CM

## 2016-06-04 DIAGNOSIS — Z79899 Other long term (current) drug therapy: Secondary | ICD-10-CM | POA: Insufficient documentation

## 2016-06-04 DIAGNOSIS — R269 Unspecified abnormalities of gait and mobility: Secondary | ICD-10-CM | POA: Insufficient documentation

## 2016-06-04 DIAGNOSIS — W1800XA Striking against unspecified object with subsequent fall, initial encounter: Secondary | ICD-10-CM | POA: Insufficient documentation

## 2016-06-04 DIAGNOSIS — Z87891 Personal history of nicotine dependence: Secondary | ICD-10-CM | POA: Insufficient documentation

## 2016-06-04 DIAGNOSIS — M4806 Spinal stenosis, lumbar region: Secondary | ICD-10-CM

## 2016-06-04 DIAGNOSIS — I10 Essential (primary) hypertension: Secondary | ICD-10-CM | POA: Diagnosis not present

## 2016-06-04 DIAGNOSIS — R2689 Other abnormalities of gait and mobility: Secondary | ICD-10-CM | POA: Diagnosis not present

## 2016-06-04 DIAGNOSIS — Z7982 Long term (current) use of aspirin: Secondary | ICD-10-CM | POA: Diagnosis not present

## 2016-06-04 DIAGNOSIS — R2681 Unsteadiness on feet: Secondary | ICD-10-CM

## 2016-06-04 DIAGNOSIS — R509 Fever, unspecified: Secondary | ICD-10-CM | POA: Diagnosis present

## 2016-06-04 DIAGNOSIS — L03116 Cellulitis of left lower limb: Secondary | ICD-10-CM

## 2016-06-04 DIAGNOSIS — R262 Difficulty in walking, not elsewhere classified: Secondary | ICD-10-CM

## 2016-06-04 DIAGNOSIS — I951 Orthostatic hypotension: Principal | ICD-10-CM

## 2016-06-04 DIAGNOSIS — W19XXXA Unspecified fall, initial encounter: Secondary | ICD-10-CM

## 2016-06-04 DIAGNOSIS — M545 Low back pain: Secondary | ICD-10-CM | POA: Diagnosis present

## 2016-06-04 DIAGNOSIS — M159 Polyosteoarthritis, unspecified: Secondary | ICD-10-CM | POA: Diagnosis present

## 2016-06-04 DIAGNOSIS — R29898 Other symptoms and signs involving the musculoskeletal system: Secondary | ICD-10-CM

## 2016-06-04 DIAGNOSIS — M48061 Spinal stenosis, lumbar region without neurogenic claudication: Secondary | ICD-10-CM | POA: Diagnosis present

## 2016-06-04 HISTORY — DX: Spinal stenosis, lumbar region without neurogenic claudication: M48.061

## 2016-06-04 HISTORY — DX: Other abnormalities of gait and mobility: R26.89

## 2016-06-04 LAB — CBC WITH DIFFERENTIAL/PLATELET
Basophils Absolute: 0 10*3/uL (ref 0.0–0.1)
Basophils Relative: 0 %
Eosinophils Absolute: 0 10*3/uL (ref 0.0–0.7)
Eosinophils Relative: 0 %
HEMATOCRIT: 40 % (ref 39.0–52.0)
HEMOGLOBIN: 13.3 g/dL (ref 13.0–17.0)
LYMPHS ABS: 0.5 10*3/uL — AB (ref 0.7–4.0)
LYMPHS PCT: 3 %
MCH: 31.7 pg (ref 26.0–34.0)
MCHC: 33.3 g/dL (ref 30.0–36.0)
MCV: 95.2 fL (ref 78.0–100.0)
Monocytes Absolute: 1 10*3/uL (ref 0.1–1.0)
Monocytes Relative: 7 %
NEUTROS ABS: 13.7 10*3/uL — AB (ref 1.7–7.7)
NEUTROS PCT: 90 %
Platelets: 202 10*3/uL (ref 150–400)
RBC: 4.2 MIL/uL — AB (ref 4.22–5.81)
RDW: 13.4 % (ref 11.5–15.5)
WBC: 15.2 10*3/uL — AB (ref 4.0–10.5)

## 2016-06-04 LAB — URINALYSIS, ROUTINE W REFLEX MICROSCOPIC
BILIRUBIN URINE: NEGATIVE
Bilirubin Urine: NEGATIVE
GLUCOSE, UA: NEGATIVE mg/dL
Glucose, UA: NEGATIVE mg/dL
HGB URINE DIPSTICK: NEGATIVE
Ketones, ur: NEGATIVE mg/dL
LEUKOCYTES UA: NEGATIVE
Leukocytes, UA: NEGATIVE
NITRITE: NEGATIVE
Nitrite: NEGATIVE
PH: 6 (ref 5.0–8.0)
Protein, ur: 30 mg/dL — AB
Specific Gravity, Urine: 1.01 (ref 1.005–1.030)
Specific Gravity, Urine: 1.01 (ref 1.005–1.030)
pH: 6.5 (ref 5.0–8.0)

## 2016-06-04 LAB — URINE MICROSCOPIC-ADD ON
BACTERIA UA: NONE SEEN
Bacteria, UA: NONE SEEN
SQUAMOUS EPITHELIAL / LPF: NONE SEEN
WBC, UA: NONE SEEN WBC/hpf (ref 0–5)

## 2016-06-04 LAB — COMPREHENSIVE METABOLIC PANEL
ALBUMIN: 4 g/dL (ref 3.5–5.0)
ALK PHOS: 138 U/L — AB (ref 38–126)
ALT: 10 U/L — AB (ref 17–63)
ANION GAP: 8 (ref 5–15)
AST: 27 U/L (ref 15–41)
BUN: 28 mg/dL — AB (ref 6–20)
CHLORIDE: 105 mmol/L (ref 101–111)
CO2: 25 mmol/L (ref 22–32)
Calcium: 9.4 mg/dL (ref 8.9–10.3)
Creatinine, Ser: 1.19 mg/dL (ref 0.61–1.24)
GFR, EST AFRICAN AMERICAN: 56 mL/min — AB (ref >60)
GFR, EST NON AFRICAN AMERICAN: 49 mL/min — AB (ref >60)
Glucose, Bld: 97 mg/dL (ref 65–99)
POTASSIUM: 3.9 mmol/L (ref 3.5–5.1)
SODIUM: 138 mmol/L (ref 135–145)
Total Bilirubin: 1.3 mg/dL — ABNORMAL HIGH (ref 0.3–1.2)
Total Protein: 7.8 g/dL (ref 6.5–8.1)

## 2016-06-04 LAB — LACTIC ACID, PLASMA
LACTIC ACID, VENOUS: 1.5 mmol/L (ref 0.5–1.9)
LACTIC ACID, VENOUS: 3.7 mmol/L — AB (ref 0.5–1.9)
Lactic Acid, Venous: 1.3 mmol/L (ref 0.5–1.9)

## 2016-06-04 LAB — SEDIMENTATION RATE: SED RATE: 50 mm/h — AB (ref 0–16)

## 2016-06-04 LAB — TROPONIN I
Troponin I: <0.03 ng/mL (ref <0.03)
Troponin I: <0.03 ng/mL (ref <0.03)

## 2016-06-04 LAB — TSH: TSH: 3.667 u[IU]/mL (ref 0.350–4.500)

## 2016-06-04 MED ORDER — LEVOTHYROXINE SODIUM 50 MCG PO TABS
50.0000 ug | ORAL_TABLET | Freq: Every day | ORAL | Status: DC
  Administered 2016-06-05 – 2016-06-06 (×2): 50 ug via ORAL
  Filled 2016-06-04 (×2): qty 1

## 2016-06-04 MED ORDER — LORAZEPAM 0.5 MG PO TABS: 0.5000 mg | ORAL_TABLET | Freq: Every day | ORAL | Status: DC | PRN

## 2016-06-04 MED ORDER — ALBUTEROL SULFATE (2.5 MG/3ML) 0.083% IN NEBU: 2.5000 mg | INHALATION_SOLUTION | RESPIRATORY_TRACT | Status: DC | PRN

## 2016-06-04 MED ORDER — SODIUM CHLORIDE 0.9 % IV SOLN
INTRAVENOUS | Status: DC
  Administered 2016-06-04: 14:00:00 via INTRAVENOUS

## 2016-06-04 MED ORDER — TRAMADOL HCL 50 MG PO TABS: 25.0000 mg | ORAL_TABLET | Freq: Two times a day (BID) | ORAL | Status: DC | PRN

## 2016-06-04 MED ORDER — POTASSIUM CHLORIDE IN NACL 20-0.9 MEQ/L-% IV SOLN
INTRAVENOUS | Status: DC
  Administered 2016-06-04 – 2016-06-05 (×2): via INTRAVENOUS

## 2016-06-04 MED ORDER — ACETAMINOPHEN 650 MG RE SUPP: 650.0000 mg | Freq: Four times a day (QID) | RECTAL | Status: DC | PRN

## 2016-06-04 MED ORDER — ISOSORBIDE DINITRATE 10 MG PO TABS: 10.0000 mg | ORAL_TABLET | Freq: Two times a day (BID) | ORAL | Status: DC

## 2016-06-04 MED ORDER — BISACODYL 5 MG PO TBEC: 5.0000 mg | DELAYED_RELEASE_TABLET | Freq: Every day | ORAL | Status: DC | PRN

## 2016-06-04 MED ORDER — PREDNISONE 20 MG PO TABS
40.0000 mg | ORAL_TABLET | Freq: Two times a day (BID) | ORAL | Status: DC
  Administered 2016-06-05: 40 mg via ORAL
  Filled 2016-06-04: qty 2

## 2016-06-04 MED ORDER — TRAMADOL HCL 50 MG PO TABS
ORAL_TABLET | ORAL | Status: AC
  Administered 2016-06-04: 25 mg
  Filled 2016-06-04: qty 1

## 2016-06-04 MED ORDER — HEPARIN SODIUM (PORCINE) 5000 UNIT/ML IJ SOLN
5000.0000 [IU] | Freq: Three times a day (TID) | INTRAMUSCULAR | Status: DC
  Administered 2016-06-04 – 2016-06-06 (×5): 5000 [IU] via SUBCUTANEOUS
  Filled 2016-06-04 (×5): qty 1

## 2016-06-04 MED ORDER — ISOSORBIDE DINITRATE 10 MG PO TABS
10.0000 mg | ORAL_TABLET | Freq: Two times a day (BID) | ORAL | Status: DC
  Administered 2016-06-05 – 2016-06-06 (×3): 10 mg via ORAL
  Filled 2016-06-04 (×5): qty 1

## 2016-06-04 MED ORDER — ACETAMINOPHEN 325 MG PO TABS
650.0000 mg | ORAL_TABLET | Freq: Four times a day (QID) | ORAL | Status: DC | PRN
  Administered 2016-06-06: 650 mg via ORAL
  Filled 2016-06-04: qty 2

## 2016-06-04 MED ORDER — IBUPROFEN 400 MG PO TABS
400.0000 mg | ORAL_TABLET | Freq: Once | ORAL | Status: AC
  Administered 2016-06-04: 400 mg via ORAL
  Filled 2016-06-04: qty 1

## 2016-06-04 MED ORDER — MECLIZINE HCL 12.5 MG PO TABS: 25.0000 mg | ORAL_TABLET | Freq: Three times a day (TID) | ORAL | Status: DC | PRN

## 2016-06-04 MED ORDER — CEFAZOLIN SODIUM-DEXTROSE 2-4 GM/100ML-% IV SOLN
INTRAVENOUS | Status: AC
  Filled 2016-06-04: qty 200

## 2016-06-04 MED ORDER — ASPIRIN 325 MG PO TABS
325.0000 mg | ORAL_TABLET | Freq: Every day | ORAL | Status: DC
Start: 2016-06-05 — End: 2016-06-06
  Administered 2016-06-05 – 2016-06-06 (×2): 325 mg via ORAL
  Filled 2016-06-04 (×2): qty 1

## 2016-06-04 MED ORDER — SODIUM CHLORIDE 0.9 % IV SOLN: INTRAVENOUS | Status: DC

## 2016-06-04 MED ORDER — POLYETHYLENE GLYCOL 3350 17 G PO PACK
17.0000 g | PACK | Freq: Every day | ORAL | Status: DC
  Administered 2016-06-04: 17 g via ORAL
  Filled 2016-06-04 (×3): qty 1

## 2016-06-04 MED ORDER — FINASTERIDE 5 MG PO TABS
5.0000 mg | ORAL_TABLET | Freq: Every day | ORAL | Status: DC
  Administered 2016-06-05 – 2016-06-06 (×2): 5 mg via ORAL
  Filled 2016-06-04 (×4): qty 1

## 2016-06-04 MED ORDER — ALLOPURINOL 100 MG PO TABS
100.0000 mg | ORAL_TABLET | Freq: Every day | ORAL | Status: DC
  Administered 2016-06-04 – 2016-06-06 (×3): 100 mg via ORAL
  Filled 2016-06-04 (×3): qty 1

## 2016-06-04 MED ORDER — CEFAZOLIN SODIUM-DEXTROSE 2-4 GM/100ML-% IV SOLN
2.0000 g | Freq: Three times a day (TID) | INTRAVENOUS | Status: DC
  Administered 2016-06-04 – 2016-06-06 (×5): 2 g via INTRAVENOUS
  Filled 2016-06-04 (×9): qty 100

## 2016-06-04 MED ORDER — SODIUM CHLORIDE 0.9 % IV BOLUS (SEPSIS)
250.0000 mL | Freq: Once | INTRAVENOUS | Status: AC
  Administered 2016-06-04: 250 mL via INTRAVENOUS

## 2016-06-04 MED ORDER — SODIUM CHLORIDE 0.9 % IV BOLUS (SEPSIS): 250.0000 mL | Freq: Once | INTRAVENOUS | Status: DC

## 2016-06-04 MED ORDER — ACETAMINOPHEN 325 MG PO TABS
650.0000 mg | ORAL_TABLET | Freq: Once | ORAL | Status: AC
  Administered 2016-06-04: 650 mg via ORAL
  Filled 2016-06-04: qty 2

## 2016-06-04 NOTE — Progress Notes (Signed)
Pharmacy Antibiotic Note  Mario May is a 82 y.o. male admitted on 06/04/2016 with cellulitis.  Pharmacy has been consulted for cefazolin dosing.  Plan: Cefazolin 2 gm IV q8 hours F/u renal function, cultures and clinical course  Height: 5\' 11"  (180.3 cm) Weight: 178 lb 8 oz (81 kg) IBW/kg (Calculated) : 75.3  Temp (24hrs), Avg:99.7 F (37.6 C), Min:98.2 F (36.8 C), Max:100.7 F (38.2 C)   Recent Labs Lab 06/04/16 0932 06/04/16 1548 06/04/16 1906  WBC 15.2*  --   --   CREATININE 1.19  --   --   LATICACIDVEN 1.3 1.5 3.7*    Estimated Creatinine Clearance: 36 mL/min (by C-G formula based on SCr of 1.19 mg/dL).    Allergies  Allergen Reactions  . Codeine Other (See Comments)    Passes out  . Grapefruit Concentrate   . Other     Potatoes, green beans, corn  . Pineapple     Antimicrobials this admission: Cefazolin 9/15 >>    Dose adjustments this admission:   Microbiology results:  BCx:  UCx:   Sputum:   MRSA PCR:   Thank you for allowing pharmacy to be a part of this patient's care.  Beverlee Nims 06/04/2016 8:34 PM

## 2016-06-04 NOTE — ED Provider Notes (Signed)
[facility] DEPT Provider Note   CSN: [id number] Arrival date & time: 06/04/16  [id number]     History   Chief Complaint Chief Complaint  Patient presents with  . Back Pain  . Fall  . Extremity Weakness    HPI Mario May is a 82 y.o. male.   Back Pain    Fall   Extremity Weakness     Pt was seen at 0915. Per pt and his family, c/o sudden onset and resolution of one episode of fall that occurred yesterday. Pt states he was walking with his walker, "turned around to quick" and fell to the floor onto his right side. Pt's wife states she "just heard the fall" and found him on the floor. Pt was able to stand and ambulate with is walker since the fall. Pt has been c/o right sided low back "pain" since the fall. Pain worsens with palpation of the area and body position changes. Pt states he was walking with his walker this morning and "my legs got weak," so he sat down on the floor. Pt denies CP/palpitations, no SOB/cough, no abd pain, no N/V/D, no tingling/numbness in extremities, no incont/retention of bowel/bladder, no fevers.      Past Medical History:  Diagnosis Date  . Anemia   . Arthritis    "just my legs" (04/06/2013)  . Constipation   . Gout   . HOH (hard of hearing)   . Hyperlipidemia   . Hypertension   . Hypothyroidism   . Kidney stones    "only once" (04/06/2013)  . Rheumatism   . SIRS (systemic inflammatory response syndrome) ([place]) 02/2013   [name] 03/19/2013  (04/06/2013)  . Small bowel obstruction [facility])     Patient Active Problem List   Diagnosis Date Noted  . Stasis dermatitis of both legs 07/01/2013  . HTN (hypertension) 03/15/2013  . HYPOTHYROIDISM 03/18/2009  . CONSTIPATION 03/18/2009  . CHANGE IN BOWELS 03/18/2009    Past Surgical History:  Procedure Laterality Date  . CYSTOSCOPY W/ STONE MANIPULATION  ?1986  . NASAL FRACTURE SURGERY    . TRANSURETHRAL RESECTION OF PROSTATE         Home Medications    Prior to Admission medications     Medication Sig Start Date End Date Taking? Authorizing Provider  allopurinol (ZYLOPRIM) 100 MG tablet Take 100 mg by mouth daily.   Yes Historical Provider, MD  amLODipine (NORVASC) 10 MG tablet Take 1 tablet (10 mg total) by mouth daily. 04/07/13  Yes Shanker Kristeen Mans, MD  aspirin 325 MG tablet Take 325 mg by mouth daily.   Yes Historical Provider, MD  bisacodyl (DULCOLAX) 5 MG EC tablet Take 1 tablet (5 mg total) by mouth daily as needed for moderate constipation. 01/22/15  Yes Gatha Mayer, MD  finasteride (PROSCAR) 5 MG tablet Take 5 mg by mouth.   Yes Historical Provider, MD  furosemide (LASIX) 20 MG tablet Take 1 tablet by mouth daily. 05/19/16  Yes Historical Provider, MD  isosorbide dinitrate (ISORDIL) 10 MG tablet Take 10 mg by mouth 2 (two) times daily.   Yes Historical Provider, MD  levothyroxine (SYNTHROID, LEVOTHROID) 50 MCG tablet Take 50 mcg by mouth daily before breakfast.   Yes Historical Provider, MD  LORazepam (ATIVAN) 0.5 MG tablet Take 1 tablet by mouth daily as needed for anxiety. 04/22/16 04/22/17 Yes Historical Provider, MD  meclizine (ANTIVERT) 25 MG tablet Take 25 mg by mouth 3 (three) times daily as needed (inner ear problems).  Yes Historical Provider, MD  polyethylene glycol (MIRALAX / GLYCOLAX) packet Take 17 g by mouth daily.   Yes Historical Provider, MD  traMADol (ULTRAM) 50 MG tablet Take 25 mg by mouth 2 (two) times daily as needed for moderate pain.   Yes Historical Provider, MD    Family History Family History  Problem Relation Age of Onset  . Colon cancer Mother   . Prostate cancer Father   . Heart disease Father   . Colon polyps Brother     x 2  . Heart disease Brother     Social History Social History  Substance Use Topics  . Smoking status: Former Smoker    Packs/day: 0.50    Types: Cigarettes  . Smokeless tobacco: Never Used     Comment: 04/06/2013 "quit smoking ~ 45-50 yr ago"  . Alcohol use No     Comment: 04/06/2013 "last time I've had any  alcohol was 09/12/1945; never had a problem w/it"     Allergies   Codeine; Grapefruit concentrate; Other; and Pineapple   Review of Systems Review of Systems  Musculoskeletal: Positive for back pain and extremity weakness.  ROS: Statement: All systems negative except as marked or noted in the HPI; Constitutional: Negative for fever and chills. ; ; Eyes: Negative for eye pain, redness and discharge. ; ; ENMT: Negative for ear pain, hoarseness, nasal congestion, sinus pressure and sore throat. ; ; Cardiovascular: Negative for chest pain, palpitations, diaphoresis, dyspnea and peripheral edema. ; ; Respiratory: Negative for cough, wheezing and stridor. ; ; Gastrointestinal: Negative for nausea, vomiting, diarrhea, abdominal pain, blood in stool, hematemesis, jaundice and rectal bleeding. . ; ; Genitourinary: Negative for dysuria, flank pain and hematuria. ; ; Musculoskeletal: +LBP. Negative for neck pain. Negative for swelling and trauma.; ; Skin: Negative for pruritus, rash, abrasions, blisters, bruising and skin lesion.; ; Neuro: +leg weakness. Negative for headache, lightheadedness and neck stiffness. Negative for altered level of consciousness, altered mental status, paresthesias, involuntary movement, seizure and syncope.      Physical Exam Updated Vital Signs BP 113/70 (BP Location: Right Arm)   Pulse 94   Temp 98.5 F (36.9 C)   Resp 22   Ht 5\' 11"  (1.803 m)   Wt 183 lb (83 kg)   SpO2 97%   BMI 25.52 kg/m    12:06 Orthostatic Vital Signs LB  Orthostatic Lying   BP- Lying: 119/67  Pulse- Lying: 85      Orthostatic Sitting  BP- Sitting: 99/63  Pulse- Sitting: 94      Orthostatic Standing at 0 minutes  BP- Standing at 0 minutes: 92/52  Pulse- Standing at 0 minutes: 102   Patient Vitals for the past 24 hrs:  BP Temp Temp src Pulse Resp SpO2 Height Weight  06/04/16 1535 - 100.5 F (38.1 C) Rectal - - - - -  06/04/16 1530 119/55 - - 85 (!) 31 99 % - -  06/04/16 1430  105/67 - - 86 23 95 % - -  06/04/16 1400 121/83 - - 77 18 95 % - -  06/04/16 1345 - - - 77 22 94 % - -  06/04/16 1330 115/66 - - 77 15 94 % - -  06/04/16 1300 109/69 - - 86 19 95 % - -  06/04/16 1230 117/64 - - 87 19 93 % - -  06/04/16 1200 117/68 - - - 18 - - -  06/04/16 1130 118/73 - - - 20 - - -  06/04/16  1108 113/70 - - 94 22 97 % - -  06/04/16 0930 145/72 - - - - - - -  06/04/16 0900 125/57 - - 90 - 93 % - -  06/04/16 0839 131/70 98.5 F (36.9 C) - 94 20 96 % 5\' 11"  (1.803 m) 183 lb (83 kg)     Physical Exam 0920: Physical examination:  Nursing notes reviewed; Vital signs and O2 SAT reviewed;  Constitutional: Well developed, Well nourished, Well hydrated, In no acute distress; Head:  Normocephalic, +small superficial abrasion right forehead.; Eyes: EOMI, PERRL, No scleral icterus; ENMT: Mouth and pharynx normal, Mucous membranes moist; Neck: Supple, Full range of motion, No lymphadenopathy; Cardiovascular: Regular rate and rhythm, No gallop; Respiratory: Breath sounds clear & equal bilaterally, No wheezes.  Speaking full sentences with ease, Normal respiratory effort/excursion; Chest: Nontender, Movement normal; Abdomen: Soft, Nontender, Nondistended, Normal bowel sounds; Genitourinary: No CVA tenderness; Spine:  No midline CS, TS, LS tenderness. +TTP right lumbar paraspinal muscles.;; Extremities: Pulses normal, Pelvis stable. NT bilat hips/knees/ankles/feet. No deformity, No edema, No calf edema or asymmetry.; Neuro: AA&Ox3, +HOH, otherwise, major CN grossly intact. No facial droop. Speech clear. Grips equal. Strength 5/5 equal bilat UE's and 4/5 equal bilat LE's.  DTR 2/4 equal bilat UE's and LE's.  No gross sensory deficits.  Normal cerebellar testing bilat UE's (finger-nose) and LE's (heel-shin)..; Skin: Color normal, Warm, Dry.   ED Treatments / Results  Labs (all labs ordered are listed, but only abnormal results are displayed)   EKG  EKG Interpretation  Date/Time:  Friday  June 04 2016 09:32:33 EDT Ventricular Rate:  90 PR Interval:    QRS Duration: 86 QT Interval:  370 QTC Calculation: 453 R Axis:   12 Text Interpretation:  Normal sinus rhythm Prolonged PR interval Multiple ventricular premature complexes Nonspecific T abnormalities, lateral leads When compared with ECG of 10/02/2013 No significant change was found Confirmed by [facility]  MD, Nunzio Cory [phone]) on 06/04/2016 9:53:40 AM       Radiology   Procedures Procedures (including critical care time)  Medications Ordered in ED Medications - No data to display   Initial Impression / Assessment and Plan / ED Course  I have reviewed the triage vital signs and the nursing notes.  Pertinent labs & imaging results that were available during my care of the patient were reviewed by me and considered in my medical decision making (see chart for details).  MDM Reviewed: previous chart, nursing note and vitals Reviewed previous: labs, ECG and x-ray Interpretation: labs, ECG, x-ray, MRI and CT scan    Results for orders placed or performed during the hospital encounter of 06/04/16  Urinalysis, Routine w reflex microscopic  Result Value Ref Range   Color, Urine YELLOW YELLOW   APPearance CLEAR CLEAR   Specific Gravity, Urine 1.010 1.005 - 1.030   pH 6.0 5.0 - 8.0   Glucose, UA NEGATIVE NEGATIVE mg/dL   Hgb urine dipstick NEGATIVE NEGATIVE   Bilirubin Urine NEGATIVE NEGATIVE   Ketones, ur NEGATIVE NEGATIVE mg/dL   Protein, ur TRACE (A) NEGATIVE mg/dL   Nitrite NEGATIVE NEGATIVE   Leukocytes, UA NEGATIVE NEGATIVE  Comprehensive metabolic panel  Result Value Ref Range   Sodium 138 135 - 145 mmol/L   Potassium 3.9 3.5 - 5.1 mmol/L   Chloride 105 101 - 111 mmol/L   CO2 25 22 - 32 mmol/L   Glucose, Bld 97 65 - 99 mg/dL   BUN 28 (H) 6 - 20 mg/dL   Creatinine,  Ser 1.19 0.61 - 1.24 mg/dL   Calcium 9.4 8.9 - 10.3 mg/dL   Total Protein 7.8 6.5 - 8.1 g/dL   Albumin 4.0 3.5 - 5.0 g/dL   AST 27 15  - 41 U/L   ALT 10 (L) 17 - 63 U/L   Alkaline Phosphatase 138 (H) 38 - 126 U/L   Total Bilirubin 1.3 (H) 0.3 - 1.2 mg/dL   GFR calc non Af Amer 49 (L) >60 mL/min   GFR calc Af Amer 56 (L) >60 mL/min   Anion gap 8 5 - 15  CBC with Differential  Result Value Ref Range   WBC 15.2 (H) 4.0 - 10.5 K/uL   RBC 4.20 (L) 4.22 - 5.81 MIL/uL   Hemoglobin 13.3 13.0 - 17.0 g/dL   HCT 40.0 39.0 - 52.0 %   MCV 95.2 78.0 - 100.0 fL   MCH 31.7 26.0 - 34.0 pg   MCHC 33.3 30.0 - 36.0 g/dL   RDW 13.4 11.5 - 15.5 %   Platelets 202 150 - 400 K/uL   Neutrophils Relative % 90 %   Neutro Abs 13.7 (H) 1.7 - 7.7 K/uL   Lymphocytes Relative 3 %   Lymphs Abs 0.5 (L) 0.7 - 4.0 K/uL   Monocytes Relative 7 %   Monocytes Absolute 1.0 0.1 - 1.0 K/uL   Eosinophils Relative 0 %   Eosinophils Absolute 0.0 0.0 - 0.7 K/uL   Basophils Relative 0 %   Basophils Absolute 0.0 0.0 - 0.1 K/uL  Troponin I  Result Value Ref Range   Troponin I <0.03 <0.03 ng/mL  Lactic acid, plasma  Result Value Ref Range   Lactic Acid, Venous 1.3 0.5 - 1.9 mmol/L  Urine microscopic-add on  Result Value Ref Range   Squamous Epithelial / LPF NONE SEEN NONE SEEN   WBC, UA NONE SEEN 0 - 5 WBC/hpf   RBC / HPF 0-5 0 - 5 RBC/hpf   Bacteria, UA NONE SEEN NONE SEEN   Dg Chest 2 View Result Date: 06/04/2016 CLINICAL DATA:  [place] yesterday.  Chest pain. EXAM: CHEST  2 VIEW COMPARISON:  10/02/2013 FINDINGS: The heart is mildly enlarged but stable. Moderate tortuosity and calcification the thoracic aorta. Low lung volumes with vascular crowding and bibasilar atelectasis. No definite effusions or infiltrates. No pneumothorax. The bony thorax is grossly intact. No definite rib fractures. The thoracic vertebral bodies appear intact. IMPRESSION: Low lung volumes with vascular crowding and streaky bibasilar atelectasis but no definite infiltrates, edema or effusions. Electronically Signed   By: Marijo Sanes M.D.   On: 06/04/2016 10:58   Ct Head Wo  Contrast Result Date: 06/04/2016 CLINICAL DATA:  [place].  Hit head. EXAM: CT HEAD WITHOUT CONTRAST CT CERVICAL SPINE WITHOUT CONTRAST TECHNIQUE: Multidetector CT imaging of the head and cervical spine was performed following the standard protocol without intravenous contrast. Multiplanar CT image reconstructions of the cervical spine were also generated. COMPARISON:  03/14/2013 FINDINGS: CT HEAD FINDINGS Brain: Stable age related cerebral atrophy, ventriculomegaly and periventricular white matter disease. No extra-axial fluid collections are identified. No CT findings for acute hemispheric infarction or intracranial hemorrhage. No mass lesions. The brainstem and cerebellum are normal. Vascular: Scattered vascular calcifications.  No hyperdense vessels. Skull: No skull fracture or bone lesion. Sinuses/Orbits: The paranasal sinuses and mastoid air cells are clear. The globes are intact. Other: No scalp hematoma or radiopaque foreign body. CT CERVICAL SPINE FINDINGS Advanced degenerative cervical spondylosis with multilevel disc disease and facet unchanged since previous study.  No acute fracture. No abnormal prevertebral soft tissue swelling. The C1-2 articulations are maintained. The dens intact. The lung apices are grossly clear. IMPRESSION: No acute intracranial findings or skull fracture. Degenerative cervical spondylosis with multilevel disc disease and facet disease but no acute cervical spine fracture. Electronically Signed   By: Marijo Sanes M.D.   On: 06/04/2016 11:20   Ct Cervical Spine Wo Contrast Result Date: 06/04/2016 CLINICAL DATA:  [place].  Hit head. EXAM: CT HEAD WITHOUT CONTRAST CT CERVICAL SPINE WITHOUT CONTRAST TECHNIQUE: Multidetector CT imaging of the head and cervical spine was performed following the standard protocol without intravenous contrast. Multiplanar CT image reconstructions of the cervical spine were also generated. COMPARISON:  03/14/2013 FINDINGS: CT HEAD FINDINGS Brain: Stable age  related cerebral atrophy, ventriculomegaly and periventricular white matter disease. No extra-axial fluid collections are identified. No CT findings for acute hemispheric infarction or intracranial hemorrhage. No mass lesions. The brainstem and cerebellum are normal. Vascular: Scattered vascular calcifications.  No hyperdense vessels. Skull: No skull fracture or bone lesion. Sinuses/Orbits: The paranasal sinuses and mastoid air cells are clear. The globes are intact. Other: No scalp hematoma or radiopaque foreign body. CT CERVICAL SPINE FINDINGS Advanced degenerative cervical spondylosis with multilevel disc disease and facet unchanged since previous study. No acute fracture. No abnormal prevertebral soft tissue swelling. The C1-2 articulations are maintained. The dens intact. The lung apices are grossly clear. IMPRESSION: No acute intracranial findings or skull fracture. Degenerative cervical spondylosis with multilevel disc disease and facet disease but no acute cervical spine fracture. Electronically Signed   By: Marijo Sanes M.D.   On: 06/04/2016 11:20   Mr Lumbar Spine Wo Contrast Result Date: 06/04/2016 CLINICAL DATA:  82 year old male with fall yesterday and new onset right leg weakness. Initial encounter. EXAM: MRI LUMBAR SPINE WITHOUT CONTRAST TECHNIQUE: Multiplanar, multisequence MR imaging of the lumbar spine was performed. No intravenous contrast was administered. COMPARISON:  [facility] CTA chest Abdomen and Pelvis 10/03/2013 and earlier. FINDINGS: Segmentation: Transitional lumbosacral anatomy as demonstrated on the prior CTA. The S1 level is fully lumbarized, with a full size S1-S2 disc space. Thoracic segmentation is normal, lowest ribs at T12. Alignment: Chronic grade 1 anterolisthesis of S1 on S2 associated with bilateral S1 chronic pars fractures. Mild retrolisthesis of L5 on S1. Slight retrolisthesis of L4 on L5. Vertebral height and alignment is stable since 2015. Vertebrae: Mild  degenerative, mostly anterior endplate edema at 624THL. No acute osseous abnormality identified. Conus medullaris: Extends to the L1 level and appears normal. Paraspinal and other soft tissues: Stable. Mild infrarenal abdominal aorta ectasia appears stable. Negative visualized posterior paraspinal soft tissues. Disc levels: T11-T12: Mild mostly anterior disc bulge and endplate spurring. No stenosis. T12-L1:  Negative. L1-L2: Mild circumferential disc bulge. Mild chronic Schmorl node. No stenosis. L2-L3: Circumferential disc bulge with endplate spurring eccentric to the left. Mild facet and ligament flavum hypertrophy. Mild left lateral recess stenosis. Borderline to mild spinal stenosis. No foraminal stenosis. L3-L4: Circumferential disc bulge with broad-based posterior component. Mild to moderate facet and ligament flavum hypertrophy. Borderline to mild left lateral recess stenosis. Moderate left and mild right L3 foraminal stenosis. Small posteriorly situated synovial cysts which should not cause neural compromise. L4-L5: Mild circumferential disc bulge. Mild to moderate facet and left ligament flavum hypertrophy. Mild left lateral recess stenosis. Mild bilateral L4 foraminal stenosis. L5-S1: Circumferential disc osteophyte complex. Broad-based posterior component. Mild to moderate facet hypertrophy. Mild bilateral lateral recess stenosis. Moderate to severe bilateral L5 foraminal stenosis.  S1-S2: Lumbarized S1 level with chronic grade 1 anterolisthesis and chronic S1 pars fractures. Circumferential disc/ pseudo disc with broad-based posterior component. Moderate facet hypertrophy. No spinal or lateral recess stenosis. Moderate to severe left greater than right S1 neural foraminal stenosis. IMPRESSION: 1. 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 Chronic S1 pars fractures and grade 1 anterolisthesis. Stable vertebral height and alignment since 2015. 2. No acute osseous abnormality identified.  Degenerative anterior vertebral body edema at L2-L3. 3. Widespread lumbar spine degeneration but with only borderline to mild spinal stenosis at L2-L3. 4. There is intermittent multilevel mild lateral recess stenosis, more so on the left. There is moderate left L3 neural foraminal stenosis and moderate to severe bilateral L5 and S1 foraminal stenosis. Electronically Signed   By: Genevie Ann M.D.   On: 06/04/2016 10:31   Dg Hips Bilat W Or Wo Pelvis 3-4 Views Result Date: 06/04/2016 CLINICAL DATA:  Bilateral hip pain since falling yesterday. Ex-smoker with history of hypertension. EXAM: DG HIP (WITH OR WITHOUT PELVIS) 3-4V BILAT COMPARISON:  Abdominal pelvic CT 10/03/2013. FINDINGS: The bones are mildly demineralized. There is no evidence of acute fracture, dislocation or femoral head avascular necrosis. There are mild degenerative changes of both hips and sacroiliac joints for age. Pelvic calcifications are likely phleboliths. There is mild atherosclerotic calcification. IMPRESSION: No acute osseous findings demonstrated. If the patient has persistent hip pain or inability to bear weight, follow up imaging may be warranted as hip fractures can be radiographically occult in the elderly. Electronically Signed   By: Richardean Sale M.D.   On: 06/04/2016 11:00    Mr Brain Wo Contrast (neuro Protocol) Result Date: 06/04/2016 CLINICAL DATA:  Confusion and weakness for 1 day. Fell and hit head. EXAM: MRI HEAD WITHOUT CONTRAST TECHNIQUE: Multiplanar, multiecho pulse sequences of the brain and surrounding structures were obtained without intravenous contrast. COMPARISON:  CT head and cervical spine earlier today. FINDINGS: The patient was unable to remain motionless for the exam. Small or subtle lesions could be overlooked. Brain: No acute infarction, hemorrhage, hydrocephalus, extra-axial collection or mass lesion. Moderate atrophy and chronic microvascular ischemic change. Vascular: Flow voids are maintained throughout  the carotid, basilar, and vertebral arteries. There are no areas of chronic hemorrhage. Skull and upper cervical spine: Unremarkable visualized calvarium, skullbase, and cervical vertebrae. Pituitary, pineal, cerebellar tonsils unremarkable. No upper cervical cord lesions. Sinuses/Orbits: No orbital masses or proptosis. Globes appear symmetric. Sinuses appear well aerated, without evidence for air-fluid level. Other: No nasopharyngeal pathology or mastoid fluid. Scalp and other visualized extracranial soft tissues grossly unremarkable. IMPRESSION: Atrophy and small vessel disease.  No acute intracranial findings. Electronically Signed   By: Staci Righter M.D.   On: 06/04/2016 15:22     1410:  Pt orthostatic on VS; judicious IVF given. Pt able to stand to ambulate only for several seconds before stating he "couldn't do it" and had to sit down. ED RN denied pt had any visualized focal weakness, and pt denied lightheadedness when asked. T/C to [facility]. Caryn Section, case discussed, including:  HPI, pertinent PM/SHx, VS/PE, dx testing, ED course and treatment:  Agreeable to admit, requests to obtain MRI brain to r/o CVA, continue IVF; will c/b after MRI.   1525:  [facility] Dr[facility] Caryn Section made aware of negative MRI.   1535:  Rectal temp 100.5. UC already obtained, will now obtain [facility] x2 and dose tylenol. [facility] Dr. Caryn Section updated.     Final Clinical Impressions(s) / ED Diagnoses   Final diagnoses:  Fall  Leg weakness, bilateral    New Prescriptions New Prescriptions   No medications on file      Francine Graven, DO 06/07/16 2158

## 2016-06-04 NOTE — ED Notes (Signed)
Pt in MRI at this time 

## 2016-06-04 NOTE — ED Notes (Signed)
Patient transported to MRI 

## 2016-06-04 NOTE — ED Notes (Signed)
Tried to ambulate Pt. Once pt was on his feet he asked me to sit him back down saying " I am not going to make it".  Pt is reporting feeling unsteady on his feet but not dizzy.  Pt was able to stand on his own for 15 seconds.

## 2016-06-04 NOTE — H&P (Addendum)
History and Physical    Mario May [id number] DOB: [birth year] DOA: 06/04/2016  PCP: Sherrie Mustache, MD Patient coming from: Home  Chief Complaint: Difficulty walking.  HPI: Mario May is a 82 y.o. male with medical history significant for degenerative joint disease, gout, hypertension, hypothyroidism, and hard of hearing, who presents to the ED with a chief complaint of difficulty walking. He also has chronic low back and neck pain. He reports falling yesterday after turning too quickly with his walker. He fell on his backside and bumped his head. There was no loss of consciousness. Since that time, he has had difficulty walking. He says that his legs will move for a few steps and all of a sudden they just stop moving. He denies trauma to his legs. He has occasional numbness in his feet. He feels that his legs are weak, but he is not sure. When his legs "stop moving" he just sits down on the floor. Eventually he is able to get back up with little assistance. He reports chronic pain in his lower back and neck. He denies any recent gout flareups. He gets up 4-5 times nightly to urinate. He occasionally has some swelling in his ankles. He denies headache, sore throat, chest pain or chest congestion, shortness of breath, nausea, vomiting, diarrhea, abdominal pain, pain with urination, or bloody stools.  ED Course: in the ED, he was afebrile but became febrile with a temperature of 100.7. He is hemodynamically stable. His lab data are significant WBC of 15.2, normal lactic acid, and a BUN of 28. His urinalysis is unremarkable. MRI of the lumbar spine revealed multilevel DJD with moderate to severe L3-L5 bilateral foraminal stenosis. MRI of his head reveal atrophy and small vessel disease, no acute findings. Pelvic x-ray revealed no hip fractures. Chest x-ray reveals low lung volumes and bibasilar atelectasis. CT of the cervical spine revealed multilevel disc disease and facet disease  but no acute fractures.   he is being admitted for 24-48 hour observation for workup of gait disorder and fever.  Review of Systems: As per HPI; he is hard of hearing, has arthritic pain in his lower back and neck, weakness in his legs, nocturia; otherwise 10 point review of systems negative.    Past Medical History:  Diagnosis Date  . Anemia   . Arthritis    "just my legs" (04/06/2013)  . Constipation   . Gout   . HOH (hard of hearing)   . Hyperlipidemia   . Hypertension   . Hypothyroidism   . Kidney stones    "only once" (04/06/2013)  . Rheumatism   . SIRS (systemic inflammatory response syndrome) ([place]) 02/2013   [name] 03/19/2013  (04/06/2013)  . Small bowel obstruction [facility])     Past Surgical History:  Procedure Laterality Date  . CYSTOSCOPY W/ STONE MANIPULATION  ?1986  . NASAL FRACTURE SURGERY    . TRANSURETHRAL RESECTION OF PROSTATE      Social history: He is married. He lives with his wife in [place]. He has 2 children. He ambulate with a walker. He reports that he has quit smoking. His smoking use included Cigarettes. He smoked 0.50 packs per day. He has never used smokeless tobacco. He reports that he does not drink alcohol or use drugs.  Allergies  Allergen Reactions  . Codeine Other (See Comments)    Passes out  . Grapefruit Concentrate   . Other     Potatoes, green beans, corn  . Pineapple  Family History  Problem Relation Age of Onset  . Colon cancer Mother   . Prostate cancer Father   . Heart disease Father   . Colon polyps Brother     x 2  . Heart disease Brother      Prior to Admission medications   Medication Sig Start Date End Date Taking? Authorizing Provider  allopurinol (ZYLOPRIM) 100 MG tablet Take 100 mg by mouth daily.   Yes Historical Provider, MD  amLODipine (NORVASC) 10 MG tablet Take 1 tablet (10 mg total) by mouth daily. 04/07/13  Yes Shanker Kristeen Mans, MD  aspirin 325 MG tablet Take 325 mg by mouth daily.   Yes Historical  Provider, MD  bisacodyl (DULCOLAX) 5 MG EC tablet Take 1 tablet (5 mg total) by mouth daily as needed for moderate constipation. 01/22/15  Yes Gatha Mayer, MD  finasteride (PROSCAR) 5 MG tablet Take 5 mg by mouth.   Yes Historical Provider, MD  furosemide (LASIX) 20 MG tablet Take 1 tablet by mouth daily. 05/19/16  Yes Historical Provider, MD  isosorbide dinitrate (ISORDIL) 10 MG tablet Take 10 mg by mouth 2 (two) times daily.   Yes Historical Provider, MD  levothyroxine (SYNTHROID, LEVOTHROID) 50 MCG tablet Take 50 mcg by mouth daily before breakfast.   Yes Historical Provider, MD  LORazepam (ATIVAN) 0.5 MG tablet Take 1 tablet by mouth daily as needed for anxiety. 04/22/16 04/22/17 Yes Historical Provider, MD  meclizine (ANTIVERT) 25 MG tablet Take 25 mg by mouth 3 (three) times daily as needed (inner ear problems).   Yes Historical Provider, MD  polyethylene glycol (MIRALAX / GLYCOLAX) packet Take 17 g by mouth daily.   Yes Historical Provider, MD  traMADol (ULTRAM) 50 MG tablet Take 25 mg by mouth 2 (two) times daily as needed for moderate pain.   Yes Historical Provider, MD    Physical Exam: Vitals:   06/04/16 1615 06/04/16 1630 06/04/16 1654 06/04/16 1702  BP:  113/78  (!) 112/52  Pulse: 73 73  69  Resp: 18 18  20   Temp:   100.7 F (38.2 C)   TempSrc:   Rectal   SpO2: 95% 92%  97%  Weight:      Height:          Constitutional: NAD, calm, comfortable; pleasant alert 82 year old Caucasian man who is hard of hearing but in no acute distress. Vitals:   06/04/16 1615 06/04/16 1630 06/04/16 1654 06/04/16 1702  BP:  113/78  (!) 112/52  Pulse: 73 73  69  Resp: 18 18  20   Temp:   100.7 F (38.2 C)   TempSrc:   Rectal   SpO2: 95% 92%  97%  Weight:      Height:       Eyes: PERRL, lids and conjunctivae normal ENMT: Mucous membranes are mildly dry. Posterior pharynx clear of any exudate or lesions. Fair dentition.  Neck: normal, supple, no masses, no thyromegaly Respiratory: clear to  auscultation bilaterally, no wheezing, no crackles. Normal respiratory effort. No accessory muscle use.  Cardiovascular: Regular rate and rhythm, no murmurs / rubs / gallops. No extremity edema. 2+ pedal pulses. No carotid bruits.  Abdomen: no tenderness, no masses palpated. No hepatosplenomegaly. Bowel sounds positive.  Musculoskeletal: no clubbing / cyanosis. Hypertrophic arthritic changes noted in the right greater than left knees, but no acute artery joints. Good ROM, no contractures. Normal muscle tone.  Skin: no rashes, lesions, ulcers. No induration. Few varicosities around the ankles. Neurologic: CN  2-12 grossly intact, except he is hard of hearing. Sensation intact, DTR normal. Strength 5/5 in all 4 except slightly decreased strength of the left lower extremity at 5 minus over 5. (Strength tested in the supine position). Gait not assessed.  Psychiatric: Normal judgment and insight. Alert and oriented x 3. Normal mood.     Labs on Admission: I have personally reviewed following labs and imaging studies  CBC:  Recent Labs Lab 06/04/16 0932  WBC 15.2*  NEUTROABS 13.7*  HGB 13.3  HCT 40.0  MCV 95.2  PLT 123XX123   Basic Metabolic Panel:  Recent Labs Lab 06/04/16 0932  NA 138  K 3.9  CL 105  CO2 25  GLUCOSE 97  BUN 28*  CREATININE 1.19  CALCIUM 9.4   GFR: Estimated Creatinine Clearance: 36 mL/min (by C-G formula based on SCr of 1.19 mg/dL). Liver Function Tests:  Recent Labs Lab 06/04/16 0932  AST 27  ALT 10*  ALKPHOS 138*  BILITOT 1.3*  PROT 7.8  ALBUMIN 4.0   No results for input(s): LIPASE, AMYLASE in the last 168 hours. No results for input(s): AMMONIA in the last 168 hours. Coagulation Profile: No results for input(s): INR, PROTIME in the last 168 hours. Cardiac Enzymes:  Recent Labs Lab 06/04/16 0932  TROPONINI <0.03   BNP (last 3 results) No results for input(s): PROBNP in the last 8760 hours. HbA1C: No results for input(s): HGBA1C in the last  72 hours. CBG: No results for input(s): GLUCAP in the last 168 hours. Lipid Profile: No results for input(s): CHOL, HDL, LDLCALC, TRIG, CHOLHDL, LDLDIRECT in the last 72 hours. Thyroid Function Tests: No results for input(s): TSH, T4TOTAL, FREET4, T3FREE, THYROIDAB in the last 72 hours. Anemia Panel: No results for input(s): VITAMINB12, FOLATE, FERRITIN, TIBC, IRON, RETICCTPCT in the last 72 hours. Urine analysis:    Component Value Date/Time   COLORURINE YELLOW 06/04/2016 [place] 06/04/2016 1715   LABSPEC 1.010 06/04/2016 1715   PHURINE 6.5 06/04/2016 1715   GLUCOSEU NEGATIVE 06/04/2016 1715   HGBUR TRACE (A) 06/04/2016 1715   BILIRUBINUR NEGATIVE 06/04/2016 1715   KETONESUR TRACE (A) 06/04/2016 1715   PROTEINUR 30 (A) 06/04/2016 1715   UROBILINOGEN 0.2 10/02/2013 2114   NITRITE NEGATIVE 06/04/2016 1715   LEUKOCYTESUR NEGATIVE 06/04/2016 1715   Sepsis Labs: !!!!!!!!!!!!!!!!!!!!!!!!!!!!!!!!!!!!!!!!!!!! @LABRCNTIP (procalcitonin:4,lacticidven:4) )No results found for this or any previous visit (from the past 240 hour(s)).   Radiological Exams on Admission: Dg Chest 2 View  Result Date: 06/04/2016 CLINICAL DATA:  [place] yesterday.  Chest pain. EXAM: CHEST  2 VIEW COMPARISON:  10/02/2013 FINDINGS: The heart is mildly enlarged but stable. Moderate tortuosity and calcification the thoracic aorta. Low lung volumes with vascular crowding and bibasilar atelectasis. No definite effusions or infiltrates. No pneumothorax. The bony thorax is grossly intact. No definite rib fractures. The thoracic vertebral bodies appear intact. IMPRESSION: Low lung volumes with vascular crowding and streaky bibasilar atelectasis but no definite infiltrates, edema or effusions. Electronically Signed   By: Marijo Sanes M.D.   On: 06/04/2016 10:58   Ct Head Wo Contrast  Result Date: 06/04/2016 CLINICAL DATA:  [place].  Hit head. EXAM: CT HEAD WITHOUT CONTRAST CT CERVICAL SPINE WITHOUT CONTRAST  TECHNIQUE: Multidetector CT imaging of the head and cervical spine was performed following the standard protocol without intravenous contrast. Multiplanar CT image reconstructions of the cervical spine were also generated. COMPARISON:  03/14/2013 FINDINGS: CT HEAD FINDINGS Brain: Stable age related cerebral atrophy, ventriculomegaly and periventricular white matter  disease. No extra-axial fluid collections are identified. No CT findings for acute hemispheric infarction or intracranial hemorrhage. No mass lesions. The brainstem and cerebellum are normal. Vascular: Scattered vascular calcifications.  No hyperdense vessels. Skull: No skull fracture or bone lesion. Sinuses/Orbits: The paranasal sinuses and mastoid air cells are clear. The globes are intact. Other: No scalp hematoma or radiopaque foreign body. CT CERVICAL SPINE FINDINGS Advanced degenerative cervical spondylosis with multilevel disc disease and facet unchanged since previous study. No acute fracture. No abnormal prevertebral soft tissue swelling. The C1-2 articulations are maintained. The dens intact. The lung apices are grossly clear. IMPRESSION: No acute intracranial findings or skull fracture. Degenerative cervical spondylosis with multilevel disc disease and facet disease but no acute cervical spine fracture. Electronically Signed   By: Marijo Sanes M.D.   On: 06/04/2016 11:20   Ct Cervical Spine Wo Contrast  Result Date: 06/04/2016 CLINICAL DATA:  [place].  Hit head. EXAM: CT HEAD WITHOUT CONTRAST CT CERVICAL SPINE WITHOUT CONTRAST TECHNIQUE: Multidetector CT imaging of the head and cervical spine was performed following the standard protocol without intravenous contrast. Multiplanar CT image reconstructions of the cervical spine were also generated. COMPARISON:  03/14/2013 FINDINGS: CT HEAD FINDINGS Brain: Stable age related cerebral atrophy, ventriculomegaly and periventricular white matter disease. No extra-axial fluid collections are  identified. No CT findings for acute hemispheric infarction or intracranial hemorrhage. No mass lesions. The brainstem and cerebellum are normal. Vascular: Scattered vascular calcifications.  No hyperdense vessels. Skull: No skull fracture or bone lesion. Sinuses/Orbits: The paranasal sinuses and mastoid air cells are clear. The globes are intact. Other: No scalp hematoma or radiopaque foreign body. CT CERVICAL SPINE FINDINGS Advanced degenerative cervical spondylosis with multilevel disc disease and facet unchanged since previous study. No acute fracture. No abnormal prevertebral soft tissue swelling. The C1-2 articulations are maintained. The dens intact. The lung apices are grossly clear. IMPRESSION: No acute intracranial findings or skull fracture. Degenerative cervical spondylosis with multilevel disc disease and facet disease but no acute cervical spine fracture. Electronically Signed   By: Marijo Sanes M.D.   On: 06/04/2016 11:20   Mr Brain Wo Contrast (neuro Protocol)  Result Date: 06/04/2016 CLINICAL DATA:  Confusion and weakness for 1 day. Fell and hit head. EXAM: MRI HEAD WITHOUT CONTRAST TECHNIQUE: Multiplanar, multiecho pulse sequences of the brain and surrounding structures were obtained without intravenous contrast. COMPARISON:  CT head and cervical spine earlier today. FINDINGS: The patient was unable to remain motionless for the exam. Small or subtle lesions could be overlooked. Brain: No acute infarction, hemorrhage, hydrocephalus, extra-axial collection or mass lesion. Moderate atrophy and chronic microvascular ischemic change. Vascular: Flow voids are maintained throughout the carotid, basilar, and vertebral arteries. There are no areas of chronic hemorrhage. Skull and upper cervical spine: Unremarkable visualized calvarium, skullbase, and cervical vertebrae. Pituitary, pineal, cerebellar tonsils unremarkable. No upper cervical cord lesions. Sinuses/Orbits: No orbital masses or proptosis.  Globes appear symmetric. Sinuses appear well aerated, without evidence for air-fluid level. Other: No nasopharyngeal pathology or mastoid fluid. Scalp and other visualized extracranial soft tissues grossly unremarkable. IMPRESSION: Atrophy and small vessel disease.  No acute intracranial findings. Electronically Signed   By: Staci Righter M.D.   On: 06/04/2016 15:22   Mr Lumbar Spine Wo Contrast  Result Date: 06/04/2016 CLINICAL DATA:  82 year old male with fall yesterday and new onset right leg weakness. Initial encounter. EXAM: MRI LUMBAR SPINE WITHOUT CONTRAST TECHNIQUE: Multiplanar, multisequence MR imaging of the lumbar spine was performed. No intravenous contrast  was administered. COMPARISON:  [facility] CTA chest Abdomen and Pelvis 10/03/2013 and earlier. FINDINGS: Segmentation: Transitional lumbosacral anatomy as demonstrated on the prior CTA. The S1 level is fully lumbarized, with a full size S1-S2 disc space. Thoracic segmentation is normal, lowest ribs at T12. Alignment: Chronic grade 1 anterolisthesis of S1 on S2 associated with bilateral S1 chronic pars fractures. Mild retrolisthesis of L5 on S1. Slight retrolisthesis of L4 on L5. Vertebral height and alignment is stable since 2015. Vertebrae: Mild degenerative, mostly anterior endplate edema at [id number]. No acute osseous abnormality identified. Conus medullaris: Extends to the L1 level and appears normal. Paraspinal and other soft tissues: Stable. Mild infrarenal abdominal aorta ectasia appears stable. Negative visualized posterior paraspinal soft tissues. Disc levels: T11-T12: Mild mostly anterior disc bulge and endplate spurring. No stenosis. T12-L1:  Negative. L1-L2: Mild circumferential disc bulge. Mild chronic Schmorl node. No stenosis. L2-L3: Circumferential disc bulge with endplate spurring eccentric to the left. Mild facet and ligament flavum hypertrophy. Mild left lateral recess stenosis. Borderline to mild spinal stenosis. No  foraminal stenosis. L3-L4: Circumferential disc bulge with broad-based posterior component. Mild to moderate facet and ligament flavum hypertrophy. Borderline to mild left lateral recess stenosis. Moderate left and mild right L3 foraminal stenosis. Small posteriorly situated synovial cysts which should not cause neural compromise. L4-L5: Mild circumferential disc bulge. Mild to moderate facet and left ligament flavum hypertrophy. Mild left lateral recess stenosis. Mild bilateral L4 foraminal stenosis. L5-S1: Circumferential disc osteophyte complex. Broad-based posterior component. Mild to moderate facet hypertrophy. Mild bilateral lateral recess stenosis. Moderate to severe bilateral L5 foraminal stenosis. S1-S2: Lumbarized S1 level with chronic grade 1 anterolisthesis and chronic S1 pars fractures. Circumferential disc/ pseudo disc with broad-based posterior component. Moderate facet hypertrophy. No spinal or lateral recess stenosis. Moderate to severe left greater than right S1 neural foraminal stenosis. IMPRESSION: 1. 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 Chronic S1 pars fractures and grade 1 anterolisthesis. Stable vertebral height and alignment since 2015. 2. No acute osseous abnormality identified. Degenerative anterior vertebral body edema at L2-L3. 3. Widespread lumbar spine degeneration but with only borderline to mild spinal stenosis at L2-L3. 4. There is intermittent multilevel mild lateral recess stenosis, more so on the left. There is moderate left L3 neural foraminal stenosis and moderate to severe bilateral L5 and S1 foraminal stenosis. Electronically Signed   By: Genevie Ann M.D.   On: 06/04/2016 10:31   Dg Hips Bilat W Or Wo Pelvis 3-4 Views  Result Date: 06/04/2016 CLINICAL DATA:  Bilateral hip pain since falling yesterday. Ex-smoker with history of hypertension. EXAM: DG HIP (WITH OR WITHOUT PELVIS) 3-4V BILAT COMPARISON:  Abdominal pelvic CT 10/03/2013. FINDINGS: The bones  are mildly demineralized. There is no evidence of acute fracture, dislocation or femoral head avascular necrosis. There are mild degenerative changes of both hips and sacroiliac joints for age. Pelvic calcifications are likely phleboliths. There is mild atherosclerotic calcification. IMPRESSION: No acute osseous findings demonstrated. If the patient has persistent hip pain or inability to bear weight, follow up imaging may be warranted as hip fractures can be radiographically occult in the elderly. Electronically Signed   By: Richardean Sale M.D.   On: 06/04/2016 11:00    EKG: Independently reviewed. Normal sinus rhythm with PVCs and lateral T-wave changes, HR 90 bpm.  Assessment/Plan Principal Problem:   Peripheral musculoskeletal gait disorder Active Problems:   Orthostatic hypotension   Lumbar foraminal stenosis   Fever   Hypothyroidism, adult   Essential hypertension  DJD (degenerative joint disease), multiple sites   Leukocytosis     1. Gait disorder, with difficulty ambulating. The patient was evaluated with many radiographic studies in the ED and there was no obvious fracture. The likely etiology is multifaceted degenerative joint disease with notable lumbar stenosis per MRI. MRI of his brain revealed no acute stroke. He has no neurological findings that would suggest a stroke. He has gout, but no acute hot red joints.  2. Orthostatic hypotension, likely to mild dehydration. Patient was orthostatic in the ED. His BUN is elevated, consistent with mild dehydration. He takes Lasix chronically. 3. Fever and leukocytosis. The patient was afebrile on arrival, but developed a fever in the ED. He has no infective symptomatology at this point. His chest x-ray was nonacute. His urinalysis was unremarkable. 4. Essential hypertension. He is treated chronically with Norvasc and Isordil. His blood pressure is on the low normal side. 5. Hypothyroidism. Patient is treated chronically with  Synthroid. 6. Chronic pain secondary to DJD. He is treated chronically with tramadol.   Plan: 1. Will start IV fluid hydration. Will hold Lasix and Norvasc. 2. Will treat his pain with as needed tramadol. Will start a prednisone taper which may help with his DJD and gait. 3. Will ask PT to evaluate him. 4. For further evaluation, will order TSH, vitamin B12, and troponin I. We'll order a sedimentation rate. 5. Blood cultures and a urine culture ordered in the ED. We'll follow-up on the results when available.   DVT prophylaxis: Subcutaneous heparin Code Status: Full code Family Communication: Discussed with his wife and daughter. Disposition Plan: Discharged to home in the next 24-48 hours. Consults called: None Admission status: Observation telemetry   Eros Montour MD [facility] Pager [phone]  If 7PM-7AM, please contact night-coverage [email or web address] Password [email or web address]  06/04/2016, 6:20 PM

## 2016-06-04 NOTE — Progress Notes (Signed)
Patient has warmth and redness to left lower leg. MD made aware.Will continue to monitor.

## 2016-06-04 NOTE — ED Notes (Signed)
Floor unable to take report at this time. Dr Caryn Section aware of rectal temp. vo to reorder clean urine and would be down within 45 minutes to assess pt. Family aware.

## 2016-06-04 NOTE — Progress Notes (Signed)
Patient lactic acid 3.7. MD made aware.

## 2016-06-04 NOTE — ED Triage Notes (Signed)
Pt states he was ambulating this morning and was unable to pick up his right leg.  Fall yesterday.

## 2016-06-04 NOTE — ED Notes (Signed)
Kathrine Cords [id number] daughter

## 2016-06-04 NOTE — ED Notes (Signed)
Skin tear from mri  Cleaned and bandaged

## 2016-06-05 ENCOUNTER — Observation Stay (HOSPITAL_COMMUNITY): Payer: Medicare Other

## 2016-06-05 DIAGNOSIS — E039 Hypothyroidism, unspecified: Secondary | ICD-10-CM | POA: Diagnosis not present

## 2016-06-05 DIAGNOSIS — L03116 Cellulitis of left lower limb: Secondary | ICD-10-CM | POA: Diagnosis not present

## 2016-06-05 DIAGNOSIS — I951 Orthostatic hypotension: Secondary | ICD-10-CM | POA: Diagnosis not present

## 2016-06-05 DIAGNOSIS — R2689 Other abnormalities of gait and mobility: Secondary | ICD-10-CM | POA: Diagnosis not present

## 2016-06-05 LAB — URINE CULTURE: CULTURE: NO GROWTH

## 2016-06-05 LAB — BASIC METABOLIC PANEL
ANION GAP: 8 (ref 5–15)
BUN: 23 mg/dL — ABNORMAL HIGH (ref 6–20)
CALCIUM: 8.6 mg/dL — AB (ref 8.9–10.3)
CO2: 22 mmol/L (ref 22–32)
Chloride: 107 mmol/L (ref 101–111)
Creatinine, Ser: 1.13 mg/dL (ref 0.61–1.24)
GFR, EST AFRICAN AMERICAN: 60 mL/min — AB (ref >60)
GFR, EST NON AFRICAN AMERICAN: 52 mL/min — AB (ref >60)
Glucose, Bld: 110 mg/dL — ABNORMAL HIGH (ref 65–99)
Potassium: 3.5 mmol/L (ref 3.5–5.1)
SODIUM: 137 mmol/L (ref 135–145)

## 2016-06-05 LAB — CBC
HCT: 38.2 % — ABNORMAL LOW (ref 39.0–52.0)
HEMOGLOBIN: 12.5 g/dL — AB (ref 13.0–17.0)
MCH: 31.5 pg (ref 26.0–34.0)
MCHC: 32.7 g/dL (ref 30.0–36.0)
MCV: 96.2 fL (ref 78.0–100.0)
Platelets: 197 10*3/uL (ref 150–400)
RBC: 3.97 MIL/uL — ABNORMAL LOW (ref 4.22–5.81)
RDW: 13.7 % (ref 11.5–15.5)
WBC: 13.7 10*3/uL — AB (ref 4.0–10.5)

## 2016-06-05 LAB — VITAMIN B12: Vitamin B-12: 625 pg/mL (ref 180–914)

## 2016-06-05 MED ORDER — AMLODIPINE BESYLATE 5 MG PO TABS
10.0000 mg | ORAL_TABLET | Freq: Every day | ORAL | Status: DC
  Administered 2016-06-05 – 2016-06-06 (×2): 10 mg via ORAL
  Filled 2016-06-05 (×2): qty 2

## 2016-06-05 MED ORDER — POTASSIUM CHLORIDE CRYS ER 20 MEQ PO TBCR
20.0000 meq | EXTENDED_RELEASE_TABLET | Freq: Every day | ORAL | Status: DC
  Administered 2016-06-05 – 2016-06-06 (×2): 20 meq via ORAL
  Filled 2016-06-05 (×2): qty 1

## 2016-06-05 MED ORDER — PREDNISONE 10 MG PO TABS
10.0000 mg | ORAL_TABLET | Freq: Two times a day (BID) | ORAL | Status: DC
  Administered 2016-06-05 – 2016-06-06 (×2): 10 mg via ORAL
  Filled 2016-06-05 (×2): qty 1

## 2016-06-05 NOTE — Progress Notes (Signed)
PROGRESS NOTE    Mario May  [id number] DOB: [birth year] DOA: 06/04/2016 PCP: Sherrie Mustache, MD    Brief Narrative:  Patient is a 82 year old man with DJD, gout, HTN, hypothyroidism, HOH, who presented to the ED on 06/05/2015 with difficulty ambulating.  the ED, he was afebrile but became febrile with a temperature of 100.7.  His lab data were significant for WBC of 15.2, normal lactic acid, and a BUN of 28. His UA was unremarkable. MRI of the lumbar spine revealed multilevel DJD with moderate to severe L3-L5 bilateral foraminal stenosis. MRI of his head reveal atrophy and small vessel disease, no acute findings. Pelvic x-ray revealed no hip fractures. Chest x-ray revealed low lung volumes and bibasilar atelectasis. CT of the cervical spine revealed multilevel disc disease and facet disease but no acute fractures. He was admitted for workup of gait disorder and fever.  Assessment & Plan:   Principal Problem:   Peripheral musculoskeletal gait disorder Active Problems:   Orthostatic hypotension   Lumbar foraminal stenosis   Fever   Cellulitis of leg, left   Hypothyroidism, adult   Essential hypertension   DJD (degenerative joint disease), multiple sites   Leukocytosis    1. Peripheral musculoskeletal gait disorder. Radiographic studies in the ED revealed no obvious fracture or cord compression. There was no evidence of an acute stroke. MRI of his L-spine did reveal significant DJD and lumbar stenosis. This is likely the etiology of his gait disorder and difficulty ambulating, along with progressive senile deconditioning. -Sedimentation rate was ordered and was modestly elevated at 50. His TSH was within normal limits. Vitamin B12 level is pending. -Patient was started on a prednisone taper. However, in light of the cellulitis, the dosing was decreased to 10 mg twice a day. -His family brought his walker, so will ask PT to evaluate him if available, otherwise, will ask the  nursing staff to attempt to ambulate him and/or get him out of bed to the chair.  Orthostatic hypotension, secondary to dehydration. The patient was orthostatic in the ED. His BUN was elevated, so the orthostatic hypotension was likely secondary to mild dehydration. -Lasix was held overnight and he was started on IV fluids for hydration. -His BUN has improved and he is less orthostatic. We will continue IV fluid hydration for another 24 hours and continue to hold Lasix for now.  Left lower extremity cellulitis The patient presented with fever and leukocytosis. There was no obvious pneumonia or UTI. Nursing reported that the patient had developed some redness over the pretibial surface of the lower leg which was not apparent on my initial exam. There is some erythema and warmth, consistent with cellulitis. -Cefazolin was started early this morning. -We'll order left lower extremity ultrasound venous Doppler to rule out DVT. Will continue DVT prophylaxis with heparin. -The fever and leukocytosis are likely from the cellulitis.  Hypertension. The patient is treated chronically with Norvasc and Isordil. Norvasc was initially withheld due to low-normal blood pressures. His blood pressures have improved, so we'll restart Norvasc.  Hypothyroidism. The patient is treated chronically with Synthroid. It was continued. His TSH was within normal limits.    DVT prophylaxis: Subcutaneous heparin Code Status: Full code Family Communication: Discussed with his daughter, Mrs. Laurette Schimke Disposition Plan: Discharge to home with clinically appropriate   Consultants:   None  Procedures:   None  Antimicrobials:   Cefazolin 06/05/16>>   Subjective: Patient wants to know when he can go home. He denies chest pain or shortness  of breath. He says that his left leg is "sore". He denies back pain.  Objective: Vitals:   06/04/16 2300 06/05/16 0300 06/05/16 0700 06/05/16 0800  BP: 120/65 139/66 139/66     Pulse: 65 75 75   Resp: 20 18 18    Temp: 98.2 F (36.8 C) 98.4 F (36.9 C) 98.4 F (36.9 C)   TempSrc: Oral Oral Oral   SpO2: 100% 93% 93% 99%  Weight:      Height:        Intake/Output Summary (Last 24 hours) at 06/05/16 1002 Last data filed at 06/05/16 0800  Gross per 24 hour  Intake           1027.5 ml  Output              550 ml  Net            477.5 ml   Filed Weights   06/04/16 0839 06/04/16 1902  Weight: 83 kg (183 lb) 81 kg (178 lb 8 oz)    Examination:  General exam: Appears calm and comfortable  Respiratory system: Clear to auscultation. Respiratory effort normal. Cardiovascular system: S1 & S2 heard, RRR. No JVD, murmurs, rubs, gallops or clicks. Trace of left lower extremity pedal edema and no edema on the right lower extremity. Gastrointestinal system: Abdomen is nondistended, soft and nontender. No organomegaly or masses felt. Normal bowel sounds heard. Central nervous system: Alert and oriented. No focal neurological deficits, except hard of hearing Extremities: Mild erythema over the left lower leg pretibial area with warmth and mild tenderness; crepitus in both knees, but no knee effusion/erythema/or warmth. Skin: Small amount of erythema over the left lower leg with mild edema and warmth; mild tenderness. Psychiatry: Judgement and insight appear normal. Mood & affect appropriate.     Data Reviewed: I have personally reviewed following labs and imaging studies  CBC:  Recent Labs Lab 06/04/16 0932 06/05/16 0619  WBC 15.2* 13.7*  NEUTROABS 13.7*  --   HGB 13.3 12.5*  HCT 40.0 38.2*  MCV 95.2 96.2  PLT 202 [id number]   Basic Metabolic Panel:  Recent Labs Lab 06/04/16 0932 06/05/16 0619  NA 138 137  K 3.9 3.5  CL 105 107  CO2 25 22  GLUCOSE 97 110*  BUN 28* 23*  CREATININE 1.19 1.13  CALCIUM 9.4 8.6*   GFR: Estimated Creatinine Clearance: 37.9 mL/min (by C-G formula based on SCr of 1.13 mg/dL). Liver Function Tests:  Recent Labs Lab  06/04/16 0932  AST 27  ALT 10*  ALKPHOS 138*  BILITOT 1.3*  PROT 7.8  ALBUMIN 4.0   No results for input(s): LIPASE, AMYLASE in the last 168 hours. No results for input(s): AMMONIA in the last 168 hours. Coagulation Profile: No results for input(s): INR, PROTIME in the last 168 hours. Cardiac Enzymes:  Recent Labs Lab 06/04/16 0932 06/04/16 1840  TROPONINI <0.03 <0.03   BNP (last 3 results) No results for input(s): PROBNP in the last 8760 hours. HbA1C: No results for input(s): HGBA1C in the last 72 hours. CBG: No results for input(s): GLUCAP in the last 168 hours. Lipid Profile: No results for input(s): CHOL, HDL, LDLCALC, TRIG, CHOLHDL, LDLDIRECT in the last 72 hours. Thyroid Function Tests:  Recent Labs  06/04/16 0930  TSH 3.667   Anemia Panel: No results for input(s): VITAMINB12, FOLATE, FERRITIN, TIBC, IRON, RETICCTPCT in the last 72 hours. Sepsis Labs:  Recent Labs Lab 06/04/16 0932 06/04/16 1548 06/04/16 1906  LATICACIDVEN 1.3 1.5  3.7*    Recent Results (from the past 240 hour(s))  Culture, blood (routine x 2)     Status: None (Preliminary result)   Collection Time: 06/04/16  3:43 PM  Result Value Ref Range Status   Specimen Description BLOOD LEFT HAND  Final   Special Requests BOTTLES DRAWN AEROBIC AND ANAEROBIC [place]  Final   Culture PENDING  Incomplete   Report Status PENDING  Incomplete  Culture, blood (routine x 2)     Status: None (Preliminary result)   Collection Time: 06/04/16  3:58 PM  Result Value Ref Range Status   Specimen Description BLOOD RIGHT HAND  Final   Special Requests BOTTLES DRAWN AEROBIC ONLY [place]  Final   Culture PENDING  Incomplete   Report Status PENDING  Incomplete         Radiology Studies: Dg Chest 2 View  Result Date: 06/04/2016 CLINICAL DATA:  [place] yesterday.  Chest pain. EXAM: CHEST  2 VIEW COMPARISON:  10/02/2013 FINDINGS: The heart is mildly enlarged but stable. Moderate tortuosity and calcification  the thoracic aorta. Low lung volumes with vascular crowding and bibasilar atelectasis. No definite effusions or infiltrates. No pneumothorax. The bony thorax is grossly intact. No definite rib fractures. The thoracic vertebral bodies appear intact. IMPRESSION: Low lung volumes with vascular crowding and streaky bibasilar atelectasis but no definite infiltrates, edema or effusions. Electronically Signed   By: Marijo Sanes M.D.   On: 06/04/2016 10:58   Ct Head Wo Contrast  Result Date: 06/04/2016 CLINICAL DATA:  [place].  Hit head. EXAM: CT HEAD WITHOUT CONTRAST CT CERVICAL SPINE WITHOUT CONTRAST TECHNIQUE: Multidetector CT imaging of the head and cervical spine was performed following the standard protocol without intravenous contrast. Multiplanar CT image reconstructions of the cervical spine were also generated. COMPARISON:  03/14/2013 FINDINGS: CT HEAD FINDINGS Brain: Stable age related cerebral atrophy, ventriculomegaly and periventricular white matter disease. No extra-axial fluid collections are identified. No CT findings for acute hemispheric infarction or intracranial hemorrhage. No mass lesions. The brainstem and cerebellum are normal. Vascular: Scattered vascular calcifications.  No hyperdense vessels. Skull: No skull fracture or bone lesion. Sinuses/Orbits: The paranasal sinuses and mastoid air cells are clear. The globes are intact. Other: No scalp hematoma or radiopaque foreign body. CT CERVICAL SPINE FINDINGS Advanced degenerative cervical spondylosis with multilevel disc disease and facet unchanged since previous study. No acute fracture. No abnormal prevertebral soft tissue swelling. The C1-2 articulations are maintained. The dens intact. The lung apices are grossly clear. IMPRESSION: No acute intracranial findings or skull fracture. Degenerative cervical spondylosis with multilevel disc disease and facet disease but no acute cervical spine fracture. Electronically Signed   By: Marijo Sanes M.D.    On: 06/04/2016 11:20   Ct Cervical Spine Wo Contrast  Result Date: 06/04/2016 CLINICAL DATA:  [place].  Hit head. EXAM: CT HEAD WITHOUT CONTRAST CT CERVICAL SPINE WITHOUT CONTRAST TECHNIQUE: Multidetector CT imaging of the head and cervical spine was performed following the standard protocol without intravenous contrast. Multiplanar CT image reconstructions of the cervical spine were also generated. COMPARISON:  03/14/2013 FINDINGS: CT HEAD FINDINGS Brain: Stable age related cerebral atrophy, ventriculomegaly and periventricular white matter disease. No extra-axial fluid collections are identified. No CT findings for acute hemispheric infarction or intracranial hemorrhage. No mass lesions. The brainstem and cerebellum are normal. Vascular: Scattered vascular calcifications.  No hyperdense vessels. Skull: No skull fracture or bone lesion. Sinuses/Orbits: The paranasal sinuses and mastoid air cells are clear. The globes are intact. Other:  No scalp hematoma or radiopaque foreign body. CT CERVICAL SPINE FINDINGS Advanced degenerative cervical spondylosis with multilevel disc disease and facet unchanged since previous study. No acute fracture. No abnormal prevertebral soft tissue swelling. The C1-2 articulations are maintained. The dens intact. The lung apices are grossly clear. IMPRESSION: No acute intracranial findings or skull fracture. Degenerative cervical spondylosis with multilevel disc disease and facet disease but no acute cervical spine fracture. Electronically Signed   By: Marijo Sanes M.D.   On: 06/04/2016 11:20   Mr Brain Wo Contrast (neuro Protocol)  Result Date: 06/04/2016 CLINICAL DATA:  Confusion and weakness for 1 day. Fell and hit head. EXAM: MRI HEAD WITHOUT CONTRAST TECHNIQUE: Multiplanar, multiecho pulse sequences of the brain and surrounding structures were obtained without intravenous contrast. COMPARISON:  CT head and cervical spine earlier today. FINDINGS: The patient was unable to remain  motionless for the exam. Small or subtle lesions could be overlooked. Brain: No acute infarction, hemorrhage, hydrocephalus, extra-axial collection or mass lesion. Moderate atrophy and chronic microvascular ischemic change. Vascular: Flow voids are maintained throughout the carotid, basilar, and vertebral arteries. There are no areas of chronic hemorrhage. Skull and upper cervical spine: Unremarkable visualized calvarium, skullbase, and cervical vertebrae. Pituitary, pineal, cerebellar tonsils unremarkable. No upper cervical cord lesions. Sinuses/Orbits: No orbital masses or proptosis. Globes appear symmetric. Sinuses appear well aerated, without evidence for air-fluid level. Other: No nasopharyngeal pathology or mastoid fluid. Scalp and other visualized extracranial soft tissues grossly unremarkable. IMPRESSION: Atrophy and small vessel disease.  No acute intracranial findings. Electronically Signed   By: Staci Righter M.D.   On: 06/04/2016 15:22   Mr Lumbar Spine Wo Contrast  Result Date: 06/04/2016 CLINICAL DATA:  82 year old male with fall yesterday and new onset right leg weakness. Initial encounter. EXAM: MRI LUMBAR SPINE WITHOUT CONTRAST TECHNIQUE: Multiplanar, multisequence MR imaging of the lumbar spine was performed. No intravenous contrast was administered. COMPARISON:  [facility] CTA chest Abdomen and Pelvis 10/03/2013 and earlier. FINDINGS: Segmentation: Transitional lumbosacral anatomy as demonstrated on the prior CTA. The S1 level is fully lumbarized, with a full size S1-S2 disc space. Thoracic segmentation is normal, lowest ribs at T12. Alignment: Chronic grade 1 anterolisthesis of S1 on S2 associated with bilateral S1 chronic pars fractures. Mild retrolisthesis of L5 on S1. Slight retrolisthesis of L4 on L5. Vertebral height and alignment is stable since 2015. Vertebrae: Mild degenerative, mostly anterior endplate edema at [id number]. No acute osseous abnormality identified. Conus  medullaris: Extends to the L1 level and appears normal. Paraspinal and other soft tissues: Stable. Mild infrarenal abdominal aorta ectasia appears stable. Negative visualized posterior paraspinal soft tissues. Disc levels: T11-T12: Mild mostly anterior disc bulge and endplate spurring. No stenosis. T12-L1:  Negative. L1-L2: Mild circumferential disc bulge. Mild chronic Schmorl node. No stenosis. L2-L3: Circumferential disc bulge with endplate spurring eccentric to the left. Mild facet and ligament flavum hypertrophy. Mild left lateral recess stenosis. Borderline to mild spinal stenosis. No foraminal stenosis. L3-L4: Circumferential disc bulge with broad-based posterior component. Mild to moderate facet and ligament flavum hypertrophy. Borderline to mild left lateral recess stenosis. Moderate left and mild right L3 foraminal stenosis. Small posteriorly situated synovial cysts which should not cause neural compromise. L4-L5: Mild circumferential disc bulge. Mild to moderate facet and left ligament flavum hypertrophy. Mild left lateral recess stenosis. Mild bilateral L4 foraminal stenosis. L5-S1: Circumferential disc osteophyte complex. Broad-based posterior component. Mild to moderate facet hypertrophy. Mild bilateral lateral recess stenosis. Moderate to severe bilateral L5 foraminal stenosis.  S1-S2: Lumbarized S1 level with chronic grade 1 anterolisthesis and chronic S1 pars fractures. Circumferential disc/ pseudo disc with broad-based posterior component. Moderate facet hypertrophy. No spinal or lateral recess stenosis. Moderate to severe left greater than right S1 neural foraminal stenosis. IMPRESSION: 1. 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 Chronic S1 pars fractures and grade 1 anterolisthesis. Stable vertebral height and alignment since 2015. 2. No acute osseous abnormality identified. Degenerative anterior vertebral body edema at L2-L3. 3. Widespread lumbar spine degeneration but with only  borderline to mild spinal stenosis at L2-L3. 4. There is intermittent multilevel mild lateral recess stenosis, more so on the left. There is moderate left L3 neural foraminal stenosis and moderate to severe bilateral L5 and S1 foraminal stenosis. Electronically Signed   By: Genevie Ann M.D.   On: 06/04/2016 10:31   Dg Hips Bilat W Or Wo Pelvis 3-4 Views  Result Date: 06/04/2016 CLINICAL DATA:  Bilateral hip pain since falling yesterday. Ex-smoker with history of hypertension. EXAM: DG HIP (WITH OR WITHOUT PELVIS) 3-4V BILAT COMPARISON:  Abdominal pelvic CT 10/03/2013. FINDINGS: The bones are mildly demineralized. There is no evidence of acute fracture, dislocation or femoral head avascular necrosis. There are mild degenerative changes of both hips and sacroiliac joints for age. Pelvic calcifications are likely phleboliths. There is mild atherosclerotic calcification. IMPRESSION: No acute osseous findings demonstrated. If the patient has persistent hip pain or inability to bear weight, follow up imaging may be warranted as hip fractures can be radiographically occult in the elderly. Electronically Signed   By: Richardean Sale M.D.   On: 06/04/2016 11:00        Scheduled Meds: . allopurinol  100 mg Oral Daily  . aspirin  325 mg Oral Daily  .  ceFAZolin (ANCEF) IV  2 g Intravenous Q8H  . finasteride  5 mg Oral Daily  . heparin  5,000 Units Subcutaneous Q8H  . isosorbide dinitrate  10 mg Oral BID  . levothyroxine  50 mcg Oral QAC breakfast  . polyethylene glycol  17 g Oral Daily  . predniSONE  10 mg Oral BID WC  . sodium chloride  250 mL Intravenous Once   Continuous Infusions: . 0.9 % NaCl with KCl 20 mEq / L 75 mL/hr at 06/04/16 1933     LOS: 0 days    Time spent: 59 minutes    Rexene Alberts, MD [facility] Pager [phone]   If 7PM-7AM, please contact night-coverage [email or web address] Password [email or web address] 06/05/2016, 10:02 AM

## 2016-06-05 NOTE — Evaluation (Signed)
Physical Therapy Evaluation Patient Details Name: Mario May MRN: [id number] DOB: May 03, 1915 Today's Date: 06/05/2016   History of Present Illness  Mario May is a 82 y.o. male with medical history significant for degenerative joint disease, gout, hypertension, hypothyroidism, and hard of hearing, who presents to the ED with a chief complaint of difficulty walking. He also has chronic low back and neck pain. He reports falling yesterday after turning too quickly with his walker. He fell on his backside and bumped his head. There was no loss of consciousness. Since that time, he has had difficulty walking. He says that his legs will move for a few steps and all of a sudden they just stop moving. He denies trauma to his legs. He has occasional numbness in his feet. He feels that his legs are weak, but he is not sure. When his legs "stop moving" he just sits down on the floor. Eventually he is able to get back up with little assistance. He reports chronic pain in his lower back and neck. He denies any recent gout flareups. He gets up 4-5 times nightly to urinate. He occasionally has some swelling in his ankles. He denies headache, sore throat, chest pain or chest congestion, shortness of breath, nausea, vomiting, diarrhea, abdominal pain, pain with urination, or bloody stools.  Clinical Impression  Upon chart review, noted that patient has been experiencing increased temperature and redness in one LE. Spoke to RN, who reported that MD is suspecting/treating for cellulitis, however MD has also ordered an ultrasound of this LE; per RN, as far as she is aware however, the MD does still want PT to attempt gait with patient today. Continued with skilled evaluation however only performed limited/cautious gait today pending results of ultrasound. Patient pleasant and willing to participate with skilled PT services today, although he is very [facility]; daughter and RN present for majority of evaluation. Patient  able to perform bed mobility with Mod(I); did not some mild LE weakness and definite core weakness with strength testing at EOB. Patient able to perform functional transfers with bed elevated and min guard, also ambulated only 36f within room with walker and min guard. Both patient and daughter report that he is generally back to his baseline status, they do not notice any major changes with his mobility at this time. Did not notice any acute SOB or DOE throughout today's session. Patient to receive skilled PT services during his stay in this facility, and at this time DPT recommends that patient receive HHPT services upon his return home. Patient left sitting up at EOB with daughter and RN present, all needs otherwise met, bed alarm activated.     Follow Up Recommendations Home health PT    Equipment Recommendations  None recommended by PT    Recommendations for Other Services OT consult     Precautions / Restrictions Precautions Precautions: Fall Restrictions Weight Bearing Restrictions: No      Mobility  Bed Mobility Overal bed mobility: Modified Independent                Transfers Overall transfer level: Needs assistance Equipment used: Rolling walker (2 wheeled) Transfers: Sit to/from Stand Sit to Stand: Min guard         General transfer comment: bed elevated, min guard   Ambulation/Gait Ambulation/Gait assistance: Min guard Ambulation Distance (Feet): 15 Feet Assistive device: Rolling walker (2 wheeled) Gait Pattern/deviations: Step-through pattern;Decreased step length - right;Decreased step length - left;Decreased stance time - right;Decreased stance time -  left;Decreased dorsiflexion - right;Decreased dorsiflexion - left;Trunk flexed     General Gait Details: slow but steady, patient and daughter report this is generally his baseline   Financial trader [employer] (Stroke Patients Only)       Balance Overall  balance assessment: No apparent balance deficits (not formally assessed)                                           Pertinent Vitals/Pain Pain Assessment: 0-10 Pain Score: 4  Pain Location: B LEs, patient reports this is normal for him  Pain Descriptors / Indicators: Discomfort Pain Intervention(s): Limited activity within patient's tolerance;Monitored during session    Home Living Family/patient expects to be discharged to:: Private residence Living Arrangements: Spouse/significant other Available Help at Discharge: Family Type of Home: House Home Access: Stairs to enter Entrance Stairs-Rails: None Entrance Stairs-Number of Steps: 1 step per daughter  Home Layout: One level Home Equipment: Environmental consultant - 2 wheels      Prior Function Level of Independence: Independent with assistive device(s)               Hand Dominance        Extremity/Trunk Assessment   Upper Extremity Assessment: Defer to OT evaluation           Lower Extremity Assessment: Overall WFL for tasks assessed      Cervical / Trunk Assessment: Kyphotic  Communication   Communication: HOH  Cognition Arousal/Alertness: Awake/alert Behavior During Therapy: WFL for tasks assessed/performed Overall Cognitive Status: Within Functional Limits for tasks assessed                      General Comments General comments (skin integrity, edema, etc.): no balance deficits noted with walker; did note general core weakness as tested at EOB     Exercises     Assessment/Plan    PT Assessment Patient needs continued PT services  PT Problem List Decreased strength;Decreased mobility;Decreased safety awareness;Decreased coordination;Decreased activity tolerance          PT Treatment Interventions DME instruction;Therapeutic activities;Gait training;Therapeutic exercise;Patient/family education;Stair training;Balance training;Functional mobility training;Neuromuscular re-education     PT Goals (Current goals can be found in the Care Plan section)  Acute Rehab PT Goals Patient Stated Goal: return to PLOF  PT Goal Formulation: With patient/family Time For Goal Achievement: 06/19/16 Potential to Achieve Goals: Good    Frequency Min 3X/week   Barriers to discharge        Co-evaluation               End of Session Equipment Utilized During Treatment: Gait belt Activity Tolerance: Patient tolerated treatment well Patient left: in bed;with call bell/phone within reach;with bed alarm set;with nursing/sitter in room;with family/visitor present Nurse Communication: Other (comment) (RN present in room for majority of session, aware of general patient mobility )    Functional Assessment Tool Used: Based on skilled clinical assessment of strength, functional mobility, gait, balance Functional Limitation: Mobility: Walking and moving around Mobility: Walking and Moving Around Current Status [id number]): At least 40 percent but less than 60 percent impaired, limited or restricted Mobility: Walking and Moving Around Goal Status [phone]): At least 20 percent but less than 40 percent impaired, limited or restricted    Time: 8921-1941 PT Time Calculation (  min) (ACUTE ONLY): 21 min   Charges:   PT Evaluation $PT Eval Low Complexity: 1 Procedure     PT G Codes:   PT G-Codes **NOT FOR INPATIENT CLASS** Functional Assessment Tool Used: Based on skilled clinical assessment of strength, functional mobility, gait, balance Functional Limitation: Mobility: Walking and moving around Mobility: Walking and Moving Around Current Status (T2182): At least 40 percent but less than 60 percent impaired, limited or restricted Mobility: Walking and Moving Around Goal Status [phone]): At least 20 percent but less than 40 percent impaired, limited or restricted   Deniece Ree PT, DPT [phone]

## 2016-06-06 ENCOUNTER — Encounter (HOSPITAL_COMMUNITY): Payer: Self-pay | Admitting: Internal Medicine

## 2016-06-06 DIAGNOSIS — I951 Orthostatic hypotension: Secondary | ICD-10-CM | POA: Diagnosis not present

## 2016-06-06 DIAGNOSIS — R262 Difficulty in walking, not elsewhere classified: Secondary | ICD-10-CM | POA: Diagnosis not present

## 2016-06-06 DIAGNOSIS — R2681 Unsteadiness on feet: Secondary | ICD-10-CM

## 2016-06-06 DIAGNOSIS — R2689 Other abnormalities of gait and mobility: Secondary | ICD-10-CM | POA: Diagnosis not present

## 2016-06-06 DIAGNOSIS — L03116 Cellulitis of left lower limb: Secondary | ICD-10-CM | POA: Diagnosis not present

## 2016-06-06 LAB — BASIC METABOLIC PANEL
Anion gap: 8 (ref 5–15)
BUN: 24 mg/dL — AB (ref 6–20)
CALCIUM: 9 mg/dL (ref 8.9–10.3)
CO2: 21 mmol/L — ABNORMAL LOW (ref 22–32)
CREATININE: 1.04 mg/dL (ref 0.61–1.24)
Chloride: 107 mmol/L (ref 101–111)
GFR calc Af Amer: >60 mL/min (ref >60)
GFR calc non Af Amer: 57 mL/min — ABNORMAL LOW (ref >60)
Glucose, Bld: 118 mg/dL — ABNORMAL HIGH (ref 65–99)
Potassium: 3.9 mmol/L (ref 3.5–5.1)
Sodium: 136 mmol/L (ref 135–145)

## 2016-06-06 LAB — CBC
HCT: 35.9 % — ABNORMAL LOW (ref 39.0–52.0)
HEMOGLOBIN: 11.7 g/dL — AB (ref 13.0–17.0)
MCH: 31.2 pg (ref 26.0–34.0)
MCHC: 32.6 g/dL (ref 30.0–36.0)
MCV: 95.7 fL (ref 78.0–100.0)
PLATELETS: 226 10*3/uL (ref 150–400)
RBC: 3.75 MIL/uL — AB (ref 4.22–5.81)
RDW: 13.9 % (ref 11.5–15.5)
WBC: 11.7 10*3/uL — ABNORMAL HIGH (ref 4.0–10.5)

## 2016-06-06 MED ORDER — CEPHALEXIN 500 MG PO CAPS: 500.0000 mg | ORAL_CAPSULE | Freq: Two times a day (BID) | ORAL | 0 refills | Status: DC

## 2016-06-06 MED ORDER — PREDNISONE 10 MG PO TABS: 10.0000 mg | ORAL_TABLET | Freq: Two times a day (BID) | ORAL | 0 refills | Status: DC

## 2016-06-06 MED ORDER — POTASSIUM CHLORIDE CRYS ER 20 MEQ PO TBCR: 10.0000 meq | EXTENDED_RELEASE_TABLET | Freq: Every day | ORAL | 3 refills | Status: DC

## 2016-06-06 NOTE — Care Management Note (Signed)
Case Management Note  Patient Details  Name: Mario May MRN: [id number] Date of Birth: [birth year]  Subjective/Objective:       Patient alert and aware of his needs.  Family supportive of care.               Action/Plan: Patient to discharge home with PT services.  Expected Discharge Date:    06/06/16              Expected Discharge Plan:  [place]  In-House Referral:  NA  Discharge planning Services  CM Consult  Post Acute Care Choice:  Durable Medical Equipment, Home Health Choice offered to:  Patient, Adult Children  DME Arranged:  Walker rolling with seat DME Agency:  [place]:  PT [place] Agency:  [place]  Status of Service:  Completed, signed off  If discussed at [place] of Stay Meetings, dates discussed:    Additional Comments:  Briant Sites, RN 06/06/2016, 11:18 AM

## 2016-06-06 NOTE — Progress Notes (Signed)
CM able to fax paperwork for review to [facility] for PT services and new four wheel walker with seat.  The current walker he has is 80 years old.

## 2016-06-06 NOTE — Care Management Obs Status (Signed)
[facility] NOTIFICATION   Patient Details  Name: Mario May MRN: [id number] Date of Birth: Apr 12, 1915   Medicare Observation Status Notification Given:  Yes    Briant Sites, RN 06/06/2016, 12:11 PM

## 2016-06-06 NOTE — Progress Notes (Signed)
Pt IV removed per NT.  Tolerated well.  Reviewed discharge instructions with pt and family.  Answered questions at this time.

## 2016-06-06 NOTE — Discharge Summary (Signed)
Physician Discharge Summary  Mario May [id number] DOB: [birth year] DOA: 06/04/2016  PCP: Sherrie Mustache, MD  Admit date: 06/04/2016 Discharge date: 06/06/2016  Time spent: Greater than 30 minutes  Recommendations for Outpatient Follow-up:  1. Home health physical therapy/nursing was ordered at the time of discharge.  2. Patient was instructed not to drive for 2 weeks.   Discharge Diagnoses:  1. 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 2. Orthostatic hypotension. 3. Left lower extremity cellulitis. 4. Essential hypertension. 5. Hypothyroidism. 6. Mild dehydration.  Discharge Condition: Improved.  Diet recommendation: Heart healthy.  Filed Weights   06/04/16 0839 06/04/16 1902  Weight: 83 kg (183 lb) 81 kg (178 lb 8 oz)    History of present illness:  Patient is a 82 year old man with DJD, gout, HTN, hypothyroidism, HOH, who presented to the ED on 06/05/2015 with difficulty ambulating.  the ED, he was afebrile but became febrile with a temperature of 100.7.  His lab data were significant for WBC of 15.2, normal lactic acid, and a BUN of 28. His UA was unremarkable. MRI of the lumbar spine revealed multilevel DJD with moderate to severe L3-L5 bilateral foraminal stenosis. MRI of his head reveal atrophy and small vessel disease, no acute findings. Pelvic x-ray revealed no hip fractures. Chest x-ray revealed low lung volumes and bibasilar atelectasis. CT of the cervical spine revealed multilevel disc disease and facet disease but no acute fractures. He was admitted for workup of gait disorder and fever.  Hospital Course:  1. Peripheral musculoskeletal gait disorder. Radiographic studies in the ED revealed no obvious fracture or cord compression. There was no evidence of an acute stroke. MRI of his L-spine did reveal significant DJD and lumbar stenosis. This is likely the etiology of his gait disorder and difficulty ambulating,  along with progressive senile deconditioning. -Sedimentation rate was ordered and was modestly elevated at 50. His TSH was within normal limits. Vitamin B12 level is pending. -Patient was started on a prednisone taper. However, in light of the cellulitis, the dosing was decreased to 10 mg twice a day. -PT was consulted and evaluated the patient. They recommended home health PT. This was ordered at the time of discharge. The patient was able to ambulate, but with assistance of his walker.  Orthostatic hypotension, secondary to dehydration. The patient was orthostatic in the ED. His BUN was elevated, so the orthostatic hypotension was likely secondary to mild dehydration. -Lasix was held overnight and he was started on IV fluids for hydration. -His BUN improved and he became less orthostatic.  Left lower extremity cellulitis The patient presented with fever and leukocytosis. There was no obvious pneumonia or UTI. Nursing reported that the patient had developed some redness over the pretibial surface of the lower leg which was not apparent on my initial exam. On reevaluation, there was some erythema and warmth, consistent with cellulitis. Cefazolin was started. Venous ultrasound of his left lower extremity was ordered to rule out DVT and it was negative. -The cellulitis subsided. He was discharged on Keflex twice a day for 4 more days.  Hypertension. The patient is treated chronically with Norvasc and Isordil. Norvasc was initially withheld due to low-normal blood pressures. His blood pressures improved. He was discharged on his home regimen.  Hypothyroidism. The patient is treated chronically with Synthroid. It was continued. His TSH was within normal limits.    Procedures:  None  Consultations:  None  Discharge Exam: Vitals:   06/06/16 0200 06/06/16 0600  BP: (!) 144/79 139/71  Pulse: 80 65  Resp: 20 20  Temp: 98.4 F (36.9 C) 98 F (36.7 C)   General exam: Appears calm and  comfortable  Respiratory system: Clear to auscultation. Respiratory effort normal. Cardiovascular system: S1 & S2 heard, RRR. No JVD, murmurs, rubs, gallops or clicks. Trace of left lower extremity pedal edema and no edema on the right lower extremity. Gastrointestinal system: Abdomen is nondistended, soft and nontender. No organomegaly or masses felt. Normal bowel sounds heard. Central nervous system: Alert and oriented. No focal neurological deficits, except hard of hearing Extremities:  Trace erythema over the left lower leg pretibial area with less warmth and resolved tenderness.    Discharge Instructions   Discharge Instructions    Activity as tolerated - No restrictions    Complete by:  As directed    Diet - low sodium heart healthy    Complete by:  As directed    Discharge instructions    Complete by:  As directed    DO NOT DRIVE FOR 2 WEEKS.   Driving Restrictions    Complete by:  As directed    NO DRIVING FOR 2 WEEKS.   Increase activity slowly    Complete by:  As directed    Walker     Complete by:  As directed      Current Discharge Medication List    START taking these medications   Details  cephALEXin (KEFLEX) 500 MG capsule Take 1 capsule (500 mg total) by mouth 2 (two) times daily. Take antibiotic for 4 more days, starting 06/07/16. Qty: 8 capsule, Refills: 0    potassium chloride SA (K-DUR,KLOR-CON) 20 MEQ tablet Take 0.5 tablets (10 mEq total) by mouth daily. Qty: 15 tablet, Refills: 3    predniSONE (DELTASONE) 10 MG tablet Take 1 tablet (10 mg total) by mouth 2 (two) times daily with a meal. Take for 3 more days starting on 06/07/16 Qty: 6 tablet, Refills: 0      CONTINUE these medications which have NOT CHANGED   Details  allopurinol (ZYLOPRIM) 100 MG tablet Take 100 mg by mouth daily.    amLODipine (NORVASC) 10 MG tablet Take 1 tablet (10 mg total) by mouth daily. Qty: 30 tablet, Refills: 0    aspirin 325 MG tablet Take 325 mg by mouth daily.     bisacodyl (DULCOLAX) 5 MG EC tablet Take 1 tablet (5 mg total) by mouth daily as needed for moderate constipation. Qty: 30 tablet, Refills: 0    finasteride (PROSCAR) 5 MG tablet Take 5 mg by mouth.    furosemide (LASIX) 20 MG tablet Take 1 tablet by mouth daily.    isosorbide dinitrate (ISORDIL) 10 MG tablet Take 10 mg by mouth 2 (two) times daily.    levothyroxine (SYNTHROID, LEVOTHROID) 50 MCG tablet Take 50 mcg by mouth daily before breakfast.    LORazepam (ATIVAN) 0.5 MG tablet Take 1 tablet by mouth daily as needed for anxiety.    meclizine (ANTIVERT) 25 MG tablet Take 25 mg by mouth 3 (three) times daily as needed (inner ear problems).    polyethylene glycol (MIRALAX / GLYCOLAX) packet Take 17 g by mouth daily.    traMADol (ULTRAM) 50 MG tablet Take 25 mg by mouth 2 (two) times daily as needed for moderate pain.       Allergies  Allergen Reactions  . Codeine Other (See Comments)    Passes out  . Grapefruit Concentrate   . Other  Potatoes, green beans, corn  . Pineapple    Follow-up Information    Sherrie Mustache, MD. Schedule an appointment as soon as possible for a visit in 1 week(s).   Specialty:  Family Medicine Why:  TO F/UP IN 1-2 WEEKS Contact information: [place] [id number] [phone]            The results of significant diagnostics from this hospitalization (including imaging, microbiology, ancillary and laboratory) are listed below for reference.    Significant Diagnostic Studies: Dg Chest 2 View  Result Date: 06/04/2016 CLINICAL DATA:  [place] yesterday.  Chest pain. EXAM: CHEST  2 VIEW COMPARISON:  10/02/2013 FINDINGS: The heart is mildly enlarged but stable. Moderate tortuosity and calcification the thoracic aorta. Low lung volumes with vascular crowding and bibasilar atelectasis. No definite effusions or infiltrates. No pneumothorax. The bony thorax is grossly intact. No definite rib fractures. The thoracic  vertebral bodies appear intact. IMPRESSION: Low lung volumes with vascular crowding and streaky bibasilar atelectasis but no definite infiltrates, edema or effusions. Electronically Signed   By: Marijo Sanes M.D.   On: 06/04/2016 10:58   Ct Head Wo Contrast  Result Date: 06/04/2016 CLINICAL DATA:  [place].  Hit head. EXAM: CT HEAD WITHOUT CONTRAST CT CERVICAL SPINE WITHOUT CONTRAST TECHNIQUE: Multidetector CT imaging of the head and cervical spine was performed following the standard protocol without intravenous contrast. Multiplanar CT image reconstructions of the cervical spine were also generated. COMPARISON:  03/14/2013 FINDINGS: CT HEAD FINDINGS Brain: Stable age related cerebral atrophy, ventriculomegaly and periventricular white matter disease. No extra-axial fluid collections are identified. No CT findings for acute hemispheric infarction or intracranial hemorrhage. No mass lesions. The brainstem and cerebellum are normal. Vascular: Scattered vascular calcifications.  No hyperdense vessels. Skull: No skull fracture or bone lesion. Sinuses/Orbits: The paranasal sinuses and mastoid air cells are clear. The globes are intact. Other: No scalp hematoma or radiopaque foreign body. CT CERVICAL SPINE FINDINGS Advanced degenerative cervical spondylosis with multilevel disc disease and facet unchanged since previous study. No acute fracture. No abnormal prevertebral soft tissue swelling. The C1-2 articulations are maintained. The dens intact. The lung apices are grossly clear. IMPRESSION: No acute intracranial findings or skull fracture. Degenerative cervical spondylosis with multilevel disc disease and facet disease but no acute cervical spine fracture. Electronically Signed   By: Marijo Sanes M.D.   On: 06/04/2016 11:20   Ct Cervical Spine Wo Contrast  Result Date: 06/04/2016 CLINICAL DATA:  [place].  Hit head. EXAM: CT HEAD WITHOUT CONTRAST CT CERVICAL SPINE WITHOUT CONTRAST TECHNIQUE: Multidetector CT imaging  of the head and cervical spine was performed following the standard protocol without intravenous contrast. Multiplanar CT image reconstructions of the cervical spine were also generated. COMPARISON:  03/14/2013 FINDINGS: CT HEAD FINDINGS Brain: Stable age related cerebral atrophy, ventriculomegaly and periventricular white matter disease. No extra-axial fluid collections are identified. No CT findings for acute hemispheric infarction or intracranial hemorrhage. No mass lesions. The brainstem and cerebellum are normal. Vascular: Scattered vascular calcifications.  No hyperdense vessels. Skull: No skull fracture or bone lesion. Sinuses/Orbits: The paranasal sinuses and mastoid air cells are clear. The globes are intact. Other: No scalp hematoma or radiopaque foreign body. CT CERVICAL SPINE FINDINGS Advanced degenerative cervical spondylosis with multilevel disc disease and facet unchanged since previous study. No acute fracture. No abnormal prevertebral soft tissue swelling. The C1-2 articulations are maintained. The dens intact. The lung apices are grossly clear. IMPRESSION: No acute intracranial findings or skull fracture. Degenerative  cervical spondylosis with multilevel disc disease and facet disease but no acute cervical spine fracture. Electronically Signed   By: Marijo Sanes M.D.   On: 06/04/2016 11:20   Mr Brain Wo Contrast (neuro Protocol)  Result Date: 06/04/2016 CLINICAL DATA:  Confusion and weakness for 1 day. Fell and hit head. EXAM: MRI HEAD WITHOUT CONTRAST TECHNIQUE: Multiplanar, multiecho pulse sequences of the brain and surrounding structures were obtained without intravenous contrast. COMPARISON:  CT head and cervical spine earlier today. FINDINGS: The patient was unable to remain motionless for the exam. Small or subtle lesions could be overlooked. Brain: No acute infarction, hemorrhage, hydrocephalus, extra-axial collection or mass lesion. Moderate atrophy and chronic microvascular ischemic  change. Vascular: Flow voids are maintained throughout the carotid, basilar, and vertebral arteries. There are no areas of chronic hemorrhage. Skull and upper cervical spine: Unremarkable visualized calvarium, skullbase, and cervical vertebrae. Pituitary, pineal, cerebellar tonsils unremarkable. No upper cervical cord lesions. Sinuses/Orbits: No orbital masses or proptosis. Globes appear symmetric. Sinuses appear well aerated, without evidence for air-fluid level. Other: No nasopharyngeal pathology or mastoid fluid. Scalp and other visualized extracranial soft tissues grossly unremarkable. IMPRESSION: Atrophy and small vessel disease.  No acute intracranial findings. Electronically Signed   By: Staci Righter M.D.   On: 06/04/2016 15:22   Mr Lumbar Spine Wo Contrast  Result Date: 06/04/2016 CLINICAL DATA:  82 year old male with fall yesterday and new onset right leg weakness. Initial encounter. EXAM: MRI LUMBAR SPINE WITHOUT CONTRAST TECHNIQUE: Multiplanar, multisequence MR imaging of the lumbar spine was performed. No intravenous contrast was administered. COMPARISON:  [facility] CTA chest Abdomen and Pelvis 10/03/2013 and earlier. FINDINGS: Segmentation: Transitional lumbosacral anatomy as demonstrated on the prior CTA. The S1 level is fully lumbarized, with a full size S1-S2 disc space. Thoracic segmentation is normal, lowest ribs at T12. Alignment: Chronic grade 1 anterolisthesis of S1 on S2 associated with bilateral S1 chronic pars fractures. Mild retrolisthesis of L5 on S1. Slight retrolisthesis of L4 on L5. Vertebral height and alignment is stable since 2015. Vertebrae: Mild degenerative, mostly anterior endplate edema at 624THL. No acute osseous abnormality identified. Conus medullaris: Extends to the L1 level and appears normal. Paraspinal and other soft tissues: Stable. Mild infrarenal abdominal aorta ectasia appears stable. Negative visualized posterior paraspinal soft tissues. Disc levels:  T11-T12: Mild mostly anterior disc bulge and endplate spurring. No stenosis. T12-L1:  Negative. L1-L2: Mild circumferential disc bulge. Mild chronic Schmorl node. No stenosis. L2-L3: Circumferential disc bulge with endplate spurring eccentric to the left. Mild facet and ligament flavum hypertrophy. Mild left lateral recess stenosis. Borderline to mild spinal stenosis. No foraminal stenosis. L3-L4: Circumferential disc bulge with broad-based posterior component. Mild to moderate facet and ligament flavum hypertrophy. Borderline to mild left lateral recess stenosis. Moderate left and mild right L3 foraminal stenosis. Small posteriorly situated synovial cysts which should not cause neural compromise. L4-L5: Mild circumferential disc bulge. Mild to moderate facet and left ligament flavum hypertrophy. Mild left lateral recess stenosis. Mild bilateral L4 foraminal stenosis. L5-S1: Circumferential disc osteophyte complex. Broad-based posterior component. Mild to moderate facet hypertrophy. Mild bilateral lateral recess stenosis. Moderate to severe bilateral L5 foraminal stenosis. S1-S2: Lumbarized S1 level with chronic grade 1 anterolisthesis and chronic S1 pars fractures. Circumferential disc/ pseudo disc with broad-based posterior component. Moderate facet hypertrophy. No spinal or lateral recess stenosis. Moderate to severe left greater than right S1 neural foraminal stenosis. IMPRESSION: 1. 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 Chronic S1 pars fractures and grade  1 anterolisthesis. Stable vertebral height and alignment since 2015. 2. No acute osseous abnormality identified. Degenerative anterior vertebral body edema at L2-L3. 3. Widespread lumbar spine degeneration but with only borderline to mild spinal stenosis at L2-L3. 4. There is intermittent multilevel mild lateral recess stenosis, more so on the left. There is moderate left L3 neural foraminal stenosis and moderate to severe bilateral L5 and  S1 foraminal stenosis. Electronically Signed   By: Genevie Ann M.D.   On: 06/04/2016 10:31   US Venous Img Lower Unilateral Left  Result Date: 06/05/2016 CLINICAL DATA:  82 year old male with left lower extremity cellulitis of the calf and ankle EXAM: LEFT LOWER EXTREMITY VENOUS DOPPLER ULTRASOUND TECHNIQUE: Gray-scale sonography with graded compression, as well as color Doppler and duplex ultrasound were performed to evaluate the lower extremity deep venous systems from the level of the common femoral vein and including the common femoral, femoral, profunda femoral, popliteal and calf veins including the posterior tibial, peroneal and gastrocnemius veins when visible. The superficial great saphenous vein was also interrogated. Spectral Doppler was utilized to evaluate flow at rest and with distal augmentation maneuvers in the common femoral, femoral and popliteal veins. COMPARISON:  None. FINDINGS: Contralateral Common Femoral Vein: Respiratory phasicity is normal and symmetric with the symptomatic side. No evidence of thrombus. Normal compressibility. Common Femoral Vein: No evidence of thrombus. Normal compressibility, respiratory phasicity and response to augmentation. Saphenofemoral Junction: No evidence of thrombus. Normal compressibility and flow on color Doppler imaging. Profunda Femoral Vein: No evidence of thrombus. Normal compressibility and flow on color Doppler imaging. Femoral Vein: No evidence of thrombus. Normal compressibility, respiratory phasicity and response to augmentation. Popliteal Vein: No evidence of thrombus. Normal compressibility, respiratory phasicity and response to augmentation. Calf Veins: No evidence of thrombus. Normal compressibility and flow on color Doppler imaging. Superficial Great Saphenous Vein: No evidence of thrombus. Normal compressibility and flow on color Doppler imaging. Venous Reflux:  None. Other Findings:  None. IMPRESSION: No evidence of deep venous thrombosis.  Electronically Signed   By: Jacqulynn Cadet M.D.   On: 06/05/2016 17:28   Dg Hips Bilat W Or Wo Pelvis 3-4 Views  Result Date: 06/04/2016 CLINICAL DATA:  Bilateral hip pain since falling yesterday. Ex-smoker with history of hypertension. EXAM: DG HIP (WITH OR WITHOUT PELVIS) 3-4V BILAT COMPARISON:  Abdominal pelvic CT 10/03/2013. FINDINGS: The bones are mildly demineralized. There is no evidence of acute fracture, dislocation or femoral head avascular necrosis. There are mild degenerative changes of both hips and sacroiliac joints for age. Pelvic calcifications are likely phleboliths. There is mild atherosclerotic calcification. IMPRESSION: No acute osseous findings demonstrated. If the patient has persistent hip pain or inability to bear weight, follow up imaging may be warranted as hip fractures can be radiographically occult in the elderly. Electronically Signed   By: Richardean Sale M.D.   On: 06/04/2016 11:00    Microbiology: Recent Results (from the past 240 hour(s))  Urine culture     Status: None   Collection Time: 06/04/16  9:45 AM  Result Value Ref Range Status   Specimen Description URINE, CLEAN CATCH  Final   Special Requests NONE  Final   Culture NO GROWTH Performed at [facility]   Final   Report Status 06/05/2016 FINAL  Final  Culture, blood (routine x 2)     Status: None (Preliminary result)   Collection Time: 06/04/16  3:43 PM  Result Value Ref Range Status   Specimen Description BLOOD LEFT HAND  Final  Special Requests BOTTLES DRAWN AEROBIC AND ANAEROBIC 8CC EACH  Final   Culture PENDING  Incomplete   Report Status PENDING  Incomplete  Culture, blood (routine x 2)     Status: None (Preliminary result)   Collection Time: 06/04/16  3:58 PM  Result Value Ref Range Status   Specimen Description BLOOD RIGHT HAND  Final   Special Requests BOTTLES DRAWN AEROBIC ONLY 6CC ONLY  Final   Culture PENDING  Incomplete   Report Status PENDING  Incomplete      Labs: Basic Metabolic Panel:  Recent Labs Lab 06/04/16 0932 06/05/16 0619 06/06/16 0614  NA 138 137 136  K 3.9 3.5 3.9  CL 105 107 107  CO2 25 22 21*  GLUCOSE 97 110* 118*  BUN 28* 23* 24*  CREATININE 1.19 1.13 1.04  CALCIUM 9.4 8.6* 9.0   Liver Function Tests:  Recent Labs Lab 06/04/16 0932  AST 27  ALT 10*  ALKPHOS 138*  BILITOT 1.3*  PROT 7.8  ALBUMIN 4.0   No results for input(s): LIPASE, AMYLASE in the last 168 hours. No results for input(s): AMMONIA in the last 168 hours. CBC:  Recent Labs Lab 06/04/16 0932 06/05/16 0619 06/06/16 0614  WBC 15.2* 13.7* 11.7*  NEUTROABS 13.7*  --   --   HGB 13.3 12.5* 11.7*  HCT 40.0 38.2* 35.9*  MCV 95.2 96.2 95.7  PLT 202 197 226   Cardiac Enzymes:  Recent Labs Lab 06/04/16 0932 06/04/16 1840  TROPONINI <0.03 <0.03   BNP: BNP (last 3 results) No results for input(s): BNP in the last 8760 hours.  ProBNP (last 3 results) No results for input(s): PROBNP in the last 8760 hours.  CBG: No results for input(s): GLUCAP in the last 168 hours.     Signed:  Endiya Klahr MD.  [facility] 06/06/2016, 10:46 AM

## 2016-06-09 LAB — CULTURE, BLOOD (ROUTINE X 2)
Culture: NO GROWTH
Culture: NO GROWTH

## 2016-08-18 ENCOUNTER — Observation Stay (HOSPITAL_COMMUNITY)
Admission: EM | Admit: 2016-08-18 | Discharge: 2016-08-19 | Disposition: A | Discharge status: Home / Self Care | Payer: Medicare Other | Attending: Internal Medicine | Admitting: Internal Medicine

## 2016-08-18 ENCOUNTER — Encounter (HOSPITAL_COMMUNITY): Payer: Self-pay | Admitting: Emergency Medicine

## 2016-08-18 ENCOUNTER — Emergency Department (HOSPITAL_COMMUNITY): Payer: Medicare Other

## 2016-08-18 DIAGNOSIS — R7989 Other specified abnormal findings of blood chemistry: Secondary | ICD-10-CM | POA: Diagnosis present

## 2016-08-18 DIAGNOSIS — I6782 Cerebral ischemia: Secondary | ICD-10-CM | POA: Diagnosis not present

## 2016-08-18 DIAGNOSIS — S80211A Abrasion, right knee, initial encounter: Secondary | ICD-10-CM | POA: Insufficient documentation

## 2016-08-18 DIAGNOSIS — N179 Acute kidney failure, unspecified: Secondary | ICD-10-CM | POA: Diagnosis not present

## 2016-08-18 DIAGNOSIS — Y93H1 Activity, digging, shoveling and raking: Secondary | ICD-10-CM | POA: Diagnosis not present

## 2016-08-18 DIAGNOSIS — Z79899 Other long term (current) drug therapy: Secondary | ICD-10-CM | POA: Insufficient documentation

## 2016-08-18 DIAGNOSIS — R2689 Other abnormalities of gait and mobility: Secondary | ICD-10-CM | POA: Diagnosis present

## 2016-08-18 DIAGNOSIS — I10 Essential (primary) hypertension: Secondary | ICD-10-CM | POA: Diagnosis present

## 2016-08-18 DIAGNOSIS — Z7982 Long term (current) use of aspirin: Secondary | ICD-10-CM | POA: Insufficient documentation

## 2016-08-18 DIAGNOSIS — M6281 Muscle weakness (generalized): Secondary | ICD-10-CM

## 2016-08-18 DIAGNOSIS — E039 Hypothyroidism, unspecified: Secondary | ICD-10-CM | POA: Diagnosis not present

## 2016-08-18 DIAGNOSIS — Y999 Unspecified external cause status: Secondary | ICD-10-CM | POA: Insufficient documentation

## 2016-08-18 DIAGNOSIS — R748 Abnormal levels of other serum enzymes: Secondary | ICD-10-CM | POA: Diagnosis not present

## 2016-08-18 DIAGNOSIS — W1839XA Other fall on same level, initial encounter: Secondary | ICD-10-CM | POA: Diagnosis not present

## 2016-08-18 DIAGNOSIS — R2681 Unsteadiness on feet: Secondary | ICD-10-CM | POA: Diagnosis present

## 2016-08-18 DIAGNOSIS — Z87891 Personal history of nicotine dependence: Secondary | ICD-10-CM | POA: Insufficient documentation

## 2016-08-18 DIAGNOSIS — S80212A Abrasion, left knee, initial encounter: Principal | ICD-10-CM | POA: Insufficient documentation

## 2016-08-18 DIAGNOSIS — R778 Other specified abnormalities of plasma proteins: Secondary | ICD-10-CM

## 2016-08-18 DIAGNOSIS — E860 Dehydration: Secondary | ICD-10-CM | POA: Diagnosis present

## 2016-08-18 DIAGNOSIS — Y92007 Garden or yard of unspecified non-institutional (private) residence as the place of occurrence of the external cause: Secondary | ICD-10-CM | POA: Diagnosis not present

## 2016-08-18 DIAGNOSIS — S8992XA Unspecified injury of left lower leg, initial encounter: Secondary | ICD-10-CM | POA: Diagnosis present

## 2016-08-18 HISTORY — DX: Need for assistance with personal care: Z74.1

## 2016-08-18 LAB — URINALYSIS, ROUTINE W REFLEX MICROSCOPIC
BILIRUBIN URINE: NEGATIVE
Glucose, UA: NEGATIVE mg/dL
Hgb urine dipstick: NEGATIVE
KETONES UR: NEGATIVE mg/dL
LEUKOCYTES UA: NEGATIVE
NITRITE: NEGATIVE
Protein, ur: NEGATIVE mg/dL
Specific Gravity, Urine: 1.025 (ref 1.005–1.030)
pH: 5.5 (ref 5.0–8.0)

## 2016-08-18 LAB — COMPREHENSIVE METABOLIC PANEL
ALBUMIN: 3.8 g/dL (ref 3.5–5.0)
ALT: 11 U/L — ABNORMAL LOW (ref 17–63)
ANION GAP: 8 (ref 5–15)
AST: 30 U/L (ref 15–41)
Alkaline Phosphatase: 147 U/L — ABNORMAL HIGH (ref 38–126)
BUN: 34 mg/dL — ABNORMAL HIGH (ref 6–20)
CHLORIDE: 105 mmol/L (ref 101–111)
CO2: 21 mmol/L — AB (ref 22–32)
Calcium: 8.9 mg/dL (ref 8.9–10.3)
Creatinine, Ser: 1.66 mg/dL — ABNORMAL HIGH (ref 0.61–1.24)
GFR calc Af Amer: 38 mL/min — ABNORMAL LOW (ref >60)
GFR calc non Af Amer: 32 mL/min — ABNORMAL LOW (ref >60)
GLUCOSE: 108 mg/dL — AB (ref 65–99)
POTASSIUM: 4.2 mmol/L (ref 3.5–5.1)
SODIUM: 134 mmol/L — AB (ref 135–145)
TOTAL PROTEIN: 7.4 g/dL (ref 6.5–8.1)
Total Bilirubin: 0.3 mg/dL (ref 0.3–1.2)

## 2016-08-18 LAB — CBC WITH DIFFERENTIAL/PLATELET
BASOS PCT: 0 %
Basophils Absolute: 0 10*3/uL (ref 0.0–0.1)
Eosinophils Absolute: 0 10*3/uL (ref 0.0–0.7)
Eosinophils Relative: 0 %
HEMATOCRIT: 35.3 % — AB (ref 39.0–52.0)
HEMOGLOBIN: 11.6 g/dL — AB (ref 13.0–17.0)
LYMPHS ABS: 0.9 10*3/uL (ref 0.7–4.0)
LYMPHS PCT: 12 %
MCH: 31.2 pg (ref 26.0–34.0)
MCHC: 32.9 g/dL (ref 30.0–36.0)
MCV: 94.9 fL (ref 78.0–100.0)
MONOS PCT: 7 %
Monocytes Absolute: 0.5 10*3/uL (ref 0.1–1.0)
NEUTROS ABS: 5.9 10*3/uL (ref 1.7–7.7)
NEUTROS PCT: 81 %
Platelets: 253 10*3/uL (ref 150–400)
RBC: 3.72 MIL/uL — ABNORMAL LOW (ref 4.22–5.81)
RDW: 13.5 % (ref 11.5–15.5)
WBC: 7.2 10*3/uL (ref 4.0–10.5)

## 2016-08-18 LAB — TROPONIN I: Troponin I: 0.3 ng/mL (ref <0.03)

## 2016-08-18 MED ORDER — ACETAMINOPHEN 325 MG PO TABS
650.0000 mg | ORAL_TABLET | ORAL | Status: DC | PRN
  Administered 2016-08-19: 650 mg via ORAL
  Filled 2016-08-18: qty 2

## 2016-08-18 MED ORDER — ASPIRIN 325 MG PO TABS
325.0000 mg | ORAL_TABLET | Freq: Every day | ORAL | Status: DC
  Administered 2016-08-19: 325 mg via ORAL
  Filled 2016-08-18: qty 1

## 2016-08-18 MED ORDER — SODIUM CHLORIDE 0.9 % IV SOLN
INTRAVENOUS | Status: AC
  Administered 2016-08-18: 23:00:00 via INTRAVENOUS

## 2016-08-18 MED ORDER — ONDANSETRON HCL 4 MG/2ML IJ SOLN: 4.0000 mg | Freq: Four times a day (QID) | INTRAMUSCULAR | Status: DC | PRN

## 2016-08-18 MED ORDER — LEVOTHYROXINE SODIUM 50 MCG PO TABS
50.0000 ug | ORAL_TABLET | Freq: Every day | ORAL | Status: DC
  Administered 2016-08-19: 50 ug via ORAL
  Filled 2016-08-18: qty 1

## 2016-08-18 NOTE — ED Notes (Signed)
CRITICAL VALUE ALERT  Critical value received:  Troponin 0.30  Date of notification:  08/18/2016  Time of notification:  1948  Critical value read back:Yes.    Nurse who received alert:  [id number]  MD notified (1st page):  Thurnell Garbe  Time of first page:  1946  MD notified (2nd page):  Time of second page:  Responding MD:    Time MD responded:

## 2016-08-18 NOTE — H&P (Signed)
History and Physical    Mario May [id number] DOB: Aug 22, 1915 DOA: 08/18/2016  PCP: Sherrie Mustache, MD  Patient coming from:  home  Chief Complaint:   [place] down raking leaves  HPI: Mario May is a 82 y.o. male with medical history significant of HTN, HLD comes in after falling while raking his leaves in the yard.  He said he turned around to get his walker and his right leg gave out and he fell to the ground.  Pt denies any recent illnesses.  No n/v/d.  No fevers.  No sob, no cough, no chest pain, no le swelling.  A trop was done in the ED, which was elevated.  Pt was referred by dr Elise Benne for further evaluation due to his troponin elevation.   Review of Systems: As per HPI otherwise 10 point review of systems negative.   Past Medical History:  Diagnosis Date  . Anemia   . Arthritis    "just my legs" (04/06/2013)  . Assistance needed for mobility    walks with walker  . Constipation   . Gout   . HOH (hard of hearing)   . Hyperlipidemia   . Hypertension   . Hypothyroidism   . Kidney stones    "only once" (04/06/2013)  . Lumbar foraminal stenosis 06/04/2016  . 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 Rheumatism   . SIRS (systemic inflammatory response syndrome) ([place]) 02/2013   [name] 03/19/2013  (04/06/2013)  . Small bowel obstruction     Past Surgical History:  Procedure Laterality Date  . CYSTOSCOPY W/ STONE MANIPULATION  ?1986  . NASAL FRACTURE SURGERY    . TRANSURETHRAL RESECTION OF PROSTATE       reports that he has quit smoking. His smoking use included Cigarettes. He smoked 0.50 packs per day. He has never used smokeless tobacco. He reports that he does not drink alcohol or use drugs.  Allergies  Allergen Reactions  . Codeine Other (See Comments)    Passes out  . Grapefruit Concentrate   . Other     Potatoes, green beans, corn  . Pineapple     Family History  Problem Relation Age of Onset  . Colon cancer Mother   .  Prostate cancer Father   . Heart disease Father   . Colon polyps Brother     x 2  . Heart disease Brother     Prior to Admission medications   Medication Sig Start Date End Date Taking? Authorizing Provider  allopurinol (ZYLOPRIM) 100 MG tablet Take 100 mg by mouth daily.   Yes Historical Provider, MD  amLODipine (NORVASC) 10 MG tablet Take 1 tablet (10 mg total) by mouth daily. 04/07/13  Yes Shanker Kristeen Mans, MD  aspirin 325 MG tablet Take 325 mg by mouth daily.   Yes Historical Provider, MD  bisacodyl (DULCOLAX) 5 MG EC tablet Take 1 tablet (5 mg total) by mouth daily as needed for moderate constipation. 01/22/15  Yes Gatha Mayer, MD  finasteride (PROSCAR) 5 MG tablet Take 5 mg by mouth.   Yes Historical Provider, MD  furosemide (LASIX) 20 MG tablet Take 1 tablet by mouth daily. 05/19/16  Yes Historical Provider, MD  isosorbide dinitrate (ISORDIL) 10 MG tablet Take 10 mg by mouth 2 (two) times daily.   Yes Historical Provider, MD  levothyroxine (SYNTHROID, LEVOTHROID) 50 MCG tablet Take 50 mcg by mouth daily before breakfast.   Yes Historical Provider, MD  LORazepam (ATIVAN) 0.5 MG tablet Take 1  tablet by mouth daily as needed for anxiety. 04/22/16 04/22/17 Yes Historical Provider, MD  meclizine (ANTIVERT) 25 MG tablet Take 25 mg by mouth 3 (three) times daily as needed (inner ear problems).   Yes Historical Provider, MD  polyethylene glycol (MIRALAX / GLYCOLAX) packet Take 17 g by mouth daily.   Yes Historical Provider, MD  potassium chloride SA (K-DUR,KLOR-CON) 20 MEQ tablet Take 0.5 tablets (10 mEq total) by mouth daily. 06/07/16  Yes Rexene Alberts, MD  traMADol (ULTRAM) 50 MG tablet Take 50 mg by mouth 2 (two) times daily as needed for moderate pain.    Yes Historical Provider, MD    Physical Exam: Vitals:   08/18/16 1812 08/18/16 2030 08/18/16 2100 08/18/16 2115  BP:  136/68 147/75 145/62  Pulse:  68 (!) 57 (!) 59  Resp:  16 14 16   Temp:      TempSrc:      SpO2:  100% 99% 99%    Weight: 82.6 kg (182 lb)     Height: 5\' 11"  (1.803 m)      Constitutional: NAD, calm, comfortable Vitals:   08/18/16 1812 08/18/16 2030 08/18/16 2100 08/18/16 2115  BP:  136/68 147/75 145/62  Pulse:  68 (!) 57 (!) 59  Resp:  16 14 16   Temp:      TempSrc:      SpO2:  100% 99% 99%  Weight: 82.6 kg (182 lb)     Height: 5\' 11"  (1.803 m)      Eyes: PERRL, lids and conjunctivae normal ENMT: Mucous membranes are moist. Posterior pharynx clear of any exudate or lesions.Normal dentition.  Neck: normal, supple, no masses, no thyromegaly Respiratory: clear to auscultation bilaterally, no wheezing, no crackles. Normal respiratory effort. No accessory muscle use.  Cardiovascular: Regular rate and rhythm, no murmurs / rubs / gallops. No extremity edema. 2+ pedal pulses. No carotid bruits.  Abdomen: no tenderness, no masses palpated. No hepatosplenomegaly. Bowel sounds positive.  Musculoskeletal: no clubbing / cyanosis. No joint deformity upper and lower extremities. Good ROM, no contractures. Normal muscle tone.  Skin: no rashes, lesions, ulcers. No induration Neurologic: CN 2-12 grossly intact. Sensation intact, DTR normal. Strength 5/5 in all 4.  Psychiatric: Normal judgment and insight. Alert and oriented x 3. Normal mood.    Labs on Admission: I have personally reviewed following labs and imaging studies  CBC:  Recent Labs Lab 08/18/16 1838  WBC 7.2  NEUTROABS 5.9  HGB 11.6*  HCT 35.3*  MCV 94.9  PLT 123456   Basic Metabolic Panel:  Recent Labs Lab 08/18/16 1838  NA 134*  K 4.2  CL 105  CO2 21*  GLUCOSE 108*  BUN 34*  CREATININE 1.66*  CALCIUM 8.9   GFR: Estimated Creatinine Clearance: 25.8 mL/min (by C-G formula based on SCr of 1.66 mg/dL (H)). Liver Function Tests:  Recent Labs Lab 08/18/16 1838  AST 30  ALT 11*  ALKPHOS 147*  BILITOT 0.3  PROT 7.4  ALBUMIN 3.8   Cardiac Enzymes:  Recent Labs Lab 08/18/16 1838  TROPONINI 0.30*    Urine analysis:     Component Value Date/Time   COLORURINE YELLOW 06/04/2016 [place] 06/04/2016 1715   LABSPEC 1.010 06/04/2016 1715   PHURINE 6.5 06/04/2016 1715   GLUCOSEU NEGATIVE 06/04/2016 1715   HGBUR TRACE (A) 06/04/2016 1715   BILIRUBINUR NEGATIVE 06/04/2016 1715   KETONESUR TRACE (A) 06/04/2016 1715   PROTEINUR 30 (A) 06/04/2016 1715   UROBILINOGEN 0.2 10/02/2013 2114  NITRITE NEGATIVE 06/04/2016 [place] 06/04/2016 1715   Radiological Exams on Admission: Dg Chest 2 View  Result Date: 08/18/2016 CLINICAL DATA:  Recent fall, weakness EXAM: CHEST  2 VIEW COMPARISON:  06/04/2016 FINDINGS: Cardiac shadow is mildly enlarged but stable. Aortic calcifications are again seen and stable. The lungs are well aerated without focal infiltrate or sizable effusion. No acute bony abnormality is seen. IMPRESSION: No acute abnormality noted. Electronically Signed   By: Inez Catalina M.D.   On: 08/18/2016 20:00   Ct Head Wo Contrast  Result Date: 08/18/2016 CLINICAL DATA:  Weakness x1 week EXAM: CT HEAD WITHOUT CONTRAST TECHNIQUE: Contiguous axial images were obtained from the base of the skull through the vertex without intravenous contrast. COMPARISON:  MRI 08/18/2016, CT scan brain 06/04/2016 FINDINGS: Brain: No evidence of acute infarction, hemorrhage, hydrocephalus, extra-axial collection or mass lesion/mass effect. Mild to moderate periventricular and deep white matter small vessel hypodensities consistent with small vessel disease. Ventricles are similar in size and morphology. Mild atrophy. Vascular: No hyperdense vessels. There are carotid artery calcifications. Skull: Normal. Negative for fracture or focal lesion. Sinuses/Orbits: Mild mucosal thickening within the ethmoid sinuses. Globes appear intact. Other: None IMPRESSION: 1. No CT evidence for acute intracranial abnormality. 2. Moderate periventricular and deep white matter small vessel ischemic changes. Electronically  Signed   By: Donavan Foil M.D.   On: 08/18/2016 19:47   Mr Brain Wo Contrast (neuro Protocol)  Result Date: 08/18/2016 CLINICAL DATA:  Right lower extremity weakness. EXAM: MRI HEAD WITHOUT CONTRAST TECHNIQUE: Multiplanar, multiecho pulse sequences of the brain and surrounding structures were obtained without intravenous contrast. COMPARISON:  06/04/2016 FINDINGS: Brain: There is no evidence of acute infarct, intracranial hemorrhage, mass, midline shift, or extra-axial fluid collection. Mild cerebral atrophy is unchanged and within normal limits for age. Patchy cerebral white matter T2 hyperintensities are unchanged and nonspecific but compatible with moderate chronic small vessel ischemic disease. A chronic lacunar infarct is noted in the left internal capsule, unchanged. Vascular: Major intracranial vascular flow voids are unchanged, with the left vertebral artery being dominant and with possibly reduced flow in the non-dominant right vertebral artery. Skull and upper cervical spine: Unremarkable bone marrow signal. Sinuses/Orbits: Unremarkable orbits. Polypoid right maxillary sinus mucosal thickening. Clear mastoid air cells. Other: None. IMPRESSION: No acute intracranial abnormality. Next item moderate chronic small vessel ischemic disease. Electronically Signed   By: Logan Bores M.D.   On: 08/18/2016 19:34   Dg Knee Complete 4 Views Left  Result Date: 08/18/2016 CLINICAL DATA:  Fall today while raking leaves, left knee pain, initial encounter EXAM: LEFT KNEE - COMPLETE 4+ VIEW COMPARISON:  None. FINDINGS: No acute fracture or dislocation is noted. Mild medial joint space narrowing is noted. No joint effusion is seen. Diffuse vascular calcifications are seen. IMPRESSION: No acute abnormality noted. Electronically Signed   By: Inez Catalina M.D.   On: 08/18/2016 20:04   Dg Knee Complete 4 Views Right  Result Date: 08/18/2016 CLINICAL DATA:  Recent fall with right knee pain, initial encounter EXAM:  RIGHT KNEE - COMPLETE 4+ VIEW COMPARISON:  None. FINDINGS: No acute fracture or dislocation is noted. Mild meniscal calcifications are seen. No joint effusion is noted. Diffuse vascular calcifications are seen. IMPRESSION: No acute abnormality noted. Electronically Signed   By: Inez Catalina M.D.   On: 08/18/2016 20:03    EKG: Independently reviewed. nsr  Assessment/Plan 82 yo male with mechanical fall, with incidental finding of troponin elevation  Principal Problem:  Elevated troponin- likely due to AKI.  Serial trop overnight.  Aspirin.  Echo in am.  Asymptomatic.  Active Problems:   Peripheral musculoskeletal gait disorder   Gait instability   AKI (acute kidney injury) ([place])-  Gentle ivf overnight.  ua is pending.    DVT prophylaxis:  scds Code Status:  full Family Communication: none  Disposition Plan:  Per day team Consults called:  none Admission status:   observation  Pt seen before midnight DAVID,RACHAL A MD [facility]  If 7PM-7AM, please contact night-coverage [email or web address] Password [email or web address]  08/18/2016, 9:20 PM

## 2016-08-18 NOTE — ED Notes (Signed)
Went to update vital pt gone to radiology 

## 2016-08-18 NOTE — ED Notes (Signed)
Pt taken to MRI  

## 2016-08-18 NOTE — ED Triage Notes (Signed)
EMS called out for fall. Pt raking leaves. Went to get walker and toes dragging so pt sat in yard.ems called by family. A/o. No obvious  deformities noted. Family states has seen a progressive weakness over past week.

## 2016-08-18 NOTE — ED Provider Notes (Signed)
[place] DEPT Provider Note   CSN: [id number] Arrival date & time: 08/18/16  1756     History   Chief Complaint Chief Complaint  Patient presents with  . Fall    HPI Mario May is a 82 y.o. male.   Fall   Pt was seen at [facility]. Per EMS, pt's family, and pt report, c/o gradual onset and persistence of constant generalized weakness for the past 1 week. Pt states he was in his yard raking leaves PTA when he "couldn't move my right leg." Pt states he sat down in the yard and crawled to get help. Family called EMS. Pt states his RLE "is moving just fine now." Denies CP/palpitations, no SOB/cough, no abd pain, no N/V/D, no tingling/numbness in extremities, no syncope/near syncope, no fall, no neck or back pain.   Td UTD Past Medical History:  Diagnosis Date  . Anemia   . Arthritis    "just my legs" (04/06/2013)  . Assistance needed for mobility    walks with walker  . Constipation   . Gout   . HOH (hard of hearing)   . Hyperlipidemia   . Hypertension   . Hypothyroidism   . Kidney stones    "only once" (04/06/2013)  . Lumbar foraminal stenosis 06/04/2016  . 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 Rheumatism   . SIRS (systemic inflammatory response syndrome) ([place]) 02/2013   [name] 03/19/2013  (04/06/2013)  . Small bowel obstruction     Patient Active Problem List   Diagnosis Date Noted  . Difficulty in walking, not elsewhere classified   . Gait instability   . Left leg cellulitis 06/05/2016  . Orthostatic hypotension 06/04/2016  . 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 Lumbar foraminal stenosis 06/04/2016  . DJD (degenerative joint disease), multiple sites 06/04/2016  . Fever 06/04/2016  . Leukocytosis 06/04/2016  . Stasis dermatitis of both legs 07/01/2013  . Essential hypertension 03/15/2013  . Hypothyroidism, adult 03/18/2009  . CONSTIPATION 03/18/2009  . CHANGE IN BOWELS 03/18/2009    Past Surgical History:  Procedure  Laterality Date  . CYSTOSCOPY W/ STONE MANIPULATION  ?1986  . NASAL FRACTURE SURGERY    . TRANSURETHRAL RESECTION OF PROSTATE         Home Medications    Prior to Admission medications   Medication Sig Start Date End Date Taking? Authorizing Provider  allopurinol (ZYLOPRIM) 100 MG tablet Take 100 mg by mouth daily.    Historical Provider, MD  amLODipine (NORVASC) 10 MG tablet Take 1 tablet (10 mg total) by mouth daily. 04/07/13   Shanker Kristeen Mans, MD  aspirin 325 MG tablet Take 325 mg by mouth daily.    Historical Provider, MD  bisacodyl (DULCOLAX) 5 MG EC tablet Take 1 tablet (5 mg total) by mouth daily as needed for moderate constipation. 01/22/15   Gatha Mayer, MD  cephALEXin (KEFLEX) 500 MG capsule Take 1 capsule (500 mg total) by mouth 2 (two) times daily. Take antibiotic for 4 more days, starting 06/07/16. 06/06/16   Rexene Alberts, MD  finasteride (PROSCAR) 5 MG tablet Take 5 mg by mouth.    Historical Provider, MD  furosemide (LASIX) 20 MG tablet Take 1 tablet by mouth daily. 05/19/16   Historical Provider, MD  isosorbide dinitrate (ISORDIL) 10 MG tablet Take 10 mg by mouth 2 (two) times daily.    Historical Provider, MD  levothyroxine (SYNTHROID, LEVOTHROID) 50 MCG tablet Take 50 mcg by mouth daily before breakfast.    Historical  Provider, MD  LORazepam (ATIVAN) 0.5 MG tablet Take 1 tablet by mouth daily as needed for anxiety. 04/22/16 04/22/17  Historical Provider, MD  meclizine (ANTIVERT) 25 MG tablet Take 25 mg by mouth 3 (three) times daily as needed (inner ear problems).    Historical Provider, MD  polyethylene glycol (MIRALAX / GLYCOLAX) packet Take 17 g by mouth daily.    Historical Provider, MD  potassium chloride SA (K-DUR,KLOR-CON) 20 MEQ tablet Take 0.5 tablets (10 mEq total) by mouth daily. 06/07/16   Rexene Alberts, MD  predniSONE (DELTASONE) 10 MG tablet Take 1 tablet (10 mg total) by mouth 2 (two) times daily with a meal. Take for 3 more days starting on 06/07/16 06/06/16    Rexene Alberts, MD  traMADol (ULTRAM) 50 MG tablet Take 25 mg by mouth 2 (two) times daily as needed for moderate pain.    Historical Provider, MD    Family History Family History  Problem Relation Age of Onset  . Colon cancer Mother   . Prostate cancer Father   . Heart disease Father   . Colon polyps Brother     x 2  . Heart disease Brother     Social History Social History  Substance Use Topics  . Smoking status: Former Smoker    Packs/day: 0.50    Types: Cigarettes  . Smokeless tobacco: Never Used     Comment: 04/06/2013 "quit smoking ~ 45-50 yr ago"  . Alcohol use No     Comment: 04/06/2013 "last time I've had any alcohol was 09/12/1945; never had a problem w/it"     Allergies   Codeine; Grapefruit concentrate; Other; and Pineapple   Review of Systems Review of Systems ROS: Statement: All systems negative except as marked or noted in the HPI; Constitutional: Negative for fever and chills. ; ; Eyes: Negative for eye pain, redness and discharge. ; ; ENMT: Negative for ear pain, hoarseness, nasal congestion, sinus pressure and sore throat. ; ; Cardiovascular: Negative for chest pain, palpitations, diaphoresis, dyspnea and peripheral edema. ; ; Respiratory: Negative for cough, wheezing and stridor. ; ; Gastrointestinal: Negative for nausea, vomiting, diarrhea, abdominal pain, blood in stool, hematemesis, jaundice and rectal bleeding. . ; ; Genitourinary: Negative for dysuria, flank pain and hematuria. ; ; Musculoskeletal: Negative for back pain and neck pain. Negative for swelling and trauma.; ; Skin: +abrasions. Negative for pruritus, rash, blisters, bruising and skin lesion.; ; Neuro: +RLE weakness. Negative for headache, lightheadedness and neck stiffness. Negative for altered level of consciousness, altered mental status, paresthesias, involuntary movement, seizure and syncope.      Physical Exam Updated Vital Signs BP 140/60 (BP Location: Left Arm)   Pulse 60   Temp 97.4  F (36.3 C) (Oral)   Resp 16   Ht 5\' 11"  (1.803 m)   Wt 182 lb (82.6 kg)   SpO2 100%   BMI 25.38 kg/m   Physical Exam 1810: Physical examination:  Nursing notes reviewed; Vital signs and O2 SAT reviewed;  Constitutional: Well developed, Well nourished, Well hydrated, In no acute distress; Head:  Normocephalic, atraumatic; Eyes: EOMI, PERRL, No scleral icterus; ENMT: Mouth and pharynx normal, Mucous membranes moist; Neck: Supple, Full range of motion, No lymphadenopathy; Cardiovascular: Regular rate and rhythm, No gallop; Respiratory: Breath sounds clear & equal bilaterally, No wheezes.  Speaking full sentences with ease, Normal respiratory effort/excursion; Chest: Nontender, Movement normal; Abdomen: Soft, Nontender, Nondistended, Normal bowel sounds; Genitourinary: No CVA tenderness; Extremities: +multiple superficial abrasions bilat knees. NT bilat hips/knees/ankles.  Pulses normal, No tenderness, No edema, No calf edema or asymmetry.; Neuro: AA&Ox3, +HOH, otherwise major CN grossly intact. No facial droop. Speech clear. Grips equal. Strength 5/5 equal bilat UE's and LE's. No gross focal motor or sensory deficits in extremities.; Skin: Color normal, Warm, Dry.   ED Treatments / Results  Labs (all labs ordered are listed, but only abnormal results are displayed)   EKG  EKG Interpretation  Date/Time:  Wednesday August 18 2016 18:38:06 EST Ventricular Rate:  56 PR Interval:    QRS Duration: 86 QT Interval:  434 QTC Calculation: 419 R Axis:   38 Text Interpretation:  Normal sinus rhythm Nonspecific T wave abnormality Lateral leads When compared with ECG of 06/04/2016 No significant change was found Confirmed by [facility]  MD, Nunzio Cory [phone]) on 08/18/2016 6:42:55 PM       Radiology   Procedures Procedures (including critical care time)  Medications Ordered in ED Medications - No data to display   Initial Impression / Assessment and Plan / ED Course  I have reviewed the  triage vital signs and the nursing notes.  Pertinent labs & imaging results that were available during my care of the patient were reviewed by me and considered in my medical decision making (see chart for details).  MDM Reviewed: previous chart, nursing note and vitals Reviewed previous: labs and ECG Interpretation: labs, ECG, x-ray, MRI and CT scan   Results for orders placed or performed during the hospital encounter of 08/18/16  Comprehensive metabolic panel  Result Value Ref Range   Sodium 134 (L) 135 - 145 mmol/L   Potassium 4.2 3.5 - 5.1 mmol/L   Chloride 105 101 - 111 mmol/L   CO2 21 (L) 22 - 32 mmol/L   Glucose, Bld 108 (H) 65 - 99 mg/dL   BUN 34 (H) 6 - 20 mg/dL   Creatinine, Ser 1.66 (H) 0.61 - 1.24 mg/dL   Calcium 8.9 8.9 - 10.3 mg/dL   Total Protein 7.4 6.5 - 8.1 g/dL   Albumin 3.8 3.5 - 5.0 g/dL   AST 30 15 - 41 U/L   ALT 11 (L) 17 - 63 U/L   Alkaline Phosphatase 147 (H) 38 - 126 U/L   Total Bilirubin 0.3 0.3 - 1.2 mg/dL   GFR calc non Af Amer 32 (L) >60 mL/min   GFR calc Af Amer 38 (L) >60 mL/min   Anion gap 8 5 - 15  Troponin I  Result Value Ref Range   Troponin I 0.30 (HH) <0.03 ng/mL  CBC with Differential  Result Value Ref Range   WBC 7.2 4.0 - 10.5 K/uL   RBC 3.72 (L) 4.22 - 5.81 MIL/uL   Hemoglobin 11.6 (L) 13.0 - 17.0 g/dL   HCT 35.3 (L) 39.0 - 52.0 %   MCV 94.9 78.0 - 100.0 fL   MCH 31.2 26.0 - 34.0 pg   MCHC 32.9 30.0 - 36.0 g/dL   RDW 13.5 11.5 - 15.5 %   Platelets 253 150 - 400 K/uL   Neutrophils Relative % 81 %   Neutro Abs 5.9 1.7 - 7.7 K/uL   Lymphocytes Relative 12 %   Lymphs Abs 0.9 0.7 - 4.0 K/uL   Monocytes Relative 7 %   Monocytes Absolute 0.5 0.1 - 1.0 K/uL   Eosinophils Relative 0 %   Eosinophils Absolute 0.0 0.0 - 0.7 K/uL   Basophils Relative 0 %   Basophils Absolute 0.0 0.0 - 0.1 K/uL   Dg Chest 2 View Result  Date: 08/18/2016 CLINICAL DATA:  Recent fall, weakness EXAM: CHEST  2 VIEW COMPARISON:  06/04/2016 FINDINGS:  Cardiac shadow is mildly enlarged but stable. Aortic calcifications are again seen and stable. The lungs are well aerated without focal infiltrate or sizable effusion. No acute bony abnormality is seen. IMPRESSION: No acute abnormality noted. Electronically Signed   By: Inez Catalina M.D.   On: 08/18/2016 20:00   Ct Head Wo Contrast Result Date: 08/18/2016 CLINICAL DATA:  Weakness x1 week EXAM: CT HEAD WITHOUT CONTRAST TECHNIQUE: Contiguous axial images were obtained from the base of the skull through the vertex without intravenous contrast. COMPARISON:  MRI 08/18/2016, CT scan brain 06/04/2016 FINDINGS: Brain: No evidence of acute infarction, hemorrhage, hydrocephalus, extra-axial collection or mass lesion/mass effect. Mild to moderate periventricular and deep white matter small vessel hypodensities consistent with small vessel disease. Ventricles are similar in size and morphology. Mild atrophy. Vascular: No hyperdense vessels. There are carotid artery calcifications. Skull: Normal. Negative for fracture or focal lesion. Sinuses/Orbits: Mild mucosal thickening within the ethmoid sinuses. Globes appear intact. Other: None IMPRESSION: 1. No CT evidence for acute intracranial abnormality. 2. Moderate periventricular and deep white matter small vessel ischemic changes. Electronically Signed   By: Donavan Foil M.D.   On: 08/18/2016 19:47   Mr Brain Wo Contrast (neuro Protocol) Result Date: 08/18/2016 CLINICAL DATA:  Right lower extremity weakness. EXAM: MRI HEAD WITHOUT CONTRAST TECHNIQUE: Multiplanar, multiecho pulse sequences of the brain and surrounding structures were obtained without intravenous contrast. COMPARISON:  06/04/2016 FINDINGS: Brain: There is no evidence of acute infarct, intracranial hemorrhage, mass, midline shift, or extra-axial fluid collection. Mild cerebral atrophy is unchanged and within normal limits for age. Patchy cerebral white matter T2 hyperintensities are unchanged and nonspecific  but compatible with moderate chronic small vessel ischemic disease. A chronic lacunar infarct is noted in the left internal capsule, unchanged. Vascular: Major intracranial vascular flow voids are unchanged, with the left vertebral artery being dominant and with possibly reduced flow in the non-dominant right vertebral artery. Skull and upper cervical spine: Unremarkable bone marrow signal. Sinuses/Orbits: Unremarkable orbits. Polypoid right maxillary sinus mucosal thickening. Clear mastoid air cells. Other: None. IMPRESSION: No acute intracranial abnormality. Next item moderate chronic small vessel ischemic disease. Electronically Signed   By: Logan Bores M.D.   On: 08/18/2016 19:34   Dg Knee Complete 4 Views Left Result Date: 08/18/2016 CLINICAL DATA:  Fall today while raking leaves, left knee pain, initial encounter EXAM: LEFT KNEE - COMPLETE 4+ VIEW COMPARISON:  None. FINDINGS: No acute fracture or dislocation is noted. Mild medial joint space narrowing is noted. No joint effusion is seen. Diffuse vascular calcifications are seen. IMPRESSION: No acute abnormality noted. Electronically Signed   By: Inez Catalina M.D.   On: 08/18/2016 20:04   Dg Knee Complete 4 Views Right Result Date: 08/18/2016 CLINICAL DATA:  Recent fall with right knee pain, initial encounter EXAM: RIGHT KNEE - COMPLETE 4+ VIEW COMPARISON:  None. FINDINGS: No acute fracture or dislocation is noted. Mild meniscal calcifications are seen. No joint effusion is noted. Diffuse vascular calcifications are seen. IMPRESSION: No acute abnormality noted. Electronically Signed   By: Inez Catalina M.D.   On: 08/18/2016 20:03    2040:  Troponin elevated; pt denies CP now or at any time today. Neuro exam intact/unchanged. MRI brain reassuring. Dx and testing d/w pt and family.  Questions answered.  Verb understanding, agreeable to observation admit. T/C to [facility] Dr. Shanon Brow, case discussed, including:  HPI, pertinent  PM/SHx, VS/PE, dx testing, ED  course and treatment:  Agreeable to admit, requests to write temporary orders, obtain observation tele bed to team APAdmits.     Final Clinical Impressions(s) / ED Diagnoses   Final diagnoses:  None    New Prescriptions New Prescriptions   No medications on file      Francine Graven, DO 08/21/16 [id number]

## 2016-08-19 ENCOUNTER — Observation Stay (HOSPITAL_BASED_OUTPATIENT_CLINIC_OR_DEPARTMENT_OTHER): Payer: Medicare Other

## 2016-08-19 DIAGNOSIS — R748 Abnormal levels of other serum enzymes: Secondary | ICD-10-CM

## 2016-08-19 DIAGNOSIS — N179 Acute kidney failure, unspecified: Secondary | ICD-10-CM

## 2016-08-19 DIAGNOSIS — R9431 Abnormal electrocardiogram [ECG] [EKG]: Secondary | ICD-10-CM

## 2016-08-19 DIAGNOSIS — I10 Essential (primary) hypertension: Secondary | ICD-10-CM | POA: Diagnosis not present

## 2016-08-19 DIAGNOSIS — E860 Dehydration: Secondary | ICD-10-CM | POA: Diagnosis present

## 2016-08-19 LAB — BASIC METABOLIC PANEL
ANION GAP: 6 (ref 5–15)
BUN: 25 mg/dL — ABNORMAL HIGH (ref 6–20)
CALCIUM: 8.7 mg/dL — AB (ref 8.9–10.3)
CHLORIDE: 108 mmol/L (ref 101–111)
CO2: 23 mmol/L (ref 22–32)
Creatinine, Ser: 1.3 mg/dL — ABNORMAL HIGH (ref 0.61–1.24)
GFR calc non Af Amer: 44 mL/min — ABNORMAL LOW (ref >60)
GFR, EST AFRICAN AMERICAN: 51 mL/min — AB (ref >60)
Glucose, Bld: 103 mg/dL — ABNORMAL HIGH (ref 65–99)
Potassium: 4.3 mmol/L (ref 3.5–5.1)
SODIUM: 137 mmol/L (ref 135–145)

## 2016-08-19 LAB — ECHOCARDIOGRAM COMPLETE
HEIGHTINCHES: 71 in
Weight: 2821.89 oz

## 2016-08-19 LAB — TROPONIN I
TROPONIN I: 0.53 ng/mL — AB (ref <0.03)
Troponin I: 0.5 ng/mL (ref <0.03)
Troponin I: 0.44 ng/mL (ref <0.03)

## 2016-08-19 MED ORDER — POLYETHYLENE GLYCOL 3350 17 G PO PACK
17.0000 g | PACK | Freq: Every day | ORAL | Status: DC
  Administered 2016-08-19: 17 g via ORAL

## 2016-08-19 MED ORDER — ALLOPURINOL 100 MG PO TABS
100.0000 mg | ORAL_TABLET | Freq: Every day | ORAL | Status: DC
  Administered 2016-08-19: 100 mg via ORAL
  Filled 2016-08-19: qty 1

## 2016-08-19 MED ORDER — ISOSORBIDE DINITRATE 20 MG PO TABS
10.0000 mg | ORAL_TABLET | Freq: Two times a day (BID) | ORAL | Status: DC
  Administered 2016-08-19: 10 mg via ORAL

## 2016-08-19 MED ORDER — LORAZEPAM 0.5 MG PO TABS: 0.5000 mg | ORAL_TABLET | Freq: Every day | ORAL | Status: DC | PRN

## 2016-08-19 MED ORDER — TRAMADOL HCL 50 MG PO TABS
50.0000 mg | ORAL_TABLET | Freq: Two times a day (BID) | ORAL | Status: DC | PRN
  Administered 2016-08-19: 50 mg via ORAL
  Filled 2016-08-19: qty 1

## 2016-08-19 MED ORDER — AMLODIPINE BESYLATE 5 MG PO TABS
10.0000 mg | ORAL_TABLET | Freq: Every day | ORAL | Status: DC
  Administered 2016-08-19: 10 mg via ORAL
  Filled 2016-08-19: qty 2

## 2016-08-19 MED ORDER — FINASTERIDE 5 MG PO TABS
5.0000 mg | ORAL_TABLET | Freq: Every day | ORAL | Status: DC
  Filled 2016-08-19 (×2): qty 1

## 2016-08-19 NOTE — Care Management Note (Signed)
Case Management Note  Patient Details  Name: Mario May MRN: [id number] Date of Birth: 07-Nov-1914  Subjective/Objective:                  Pt admitted after falling at home. He lives at home with his wife. He is ind with ADL's. He was raking leaves at the time of his fall. He uses a walker when he leaves the house. He has PCP and drives himself to appointments. His daughter assists him with medication management. PT has recommended OP PT and pt is agreeable, he lives in [place] and would prefer to go to the OP rehab there.   Action/Plan: OP referral placed for [facility] in [place]. Anticipate DC home today. No further needs anticipated.   Expected Discharge Date:       08/19/2016           Expected Discharge Plan:  Home/Self Care  In-House Referral:  NA  Discharge planning Services  CM Consult  Post Acute Care Choice:  NA Choice offered to:  NA  Status of Service:  Completed, signed off   Sherald Barge, RN 08/19/2016, 11:56 AM

## 2016-08-19 NOTE — Evaluation (Signed)
Physical Therapy Evaluation Patient Details Name: Mario May MRN: [id number] DOB: 12/12/1914 Today's Date: 08/19/2016   History of Present Illness  82 y.o. male with medical history significant of HTN, HLD comes in after falling while raking his leaves in the yard.  He said he turned around to get his walker and his right leg gave out and he fell to the ground.  Pt denies any recent illnesses.  No n/v/d.  No fevers.  No sob, no cough, no chest pain, no le swelling.  A trop was done in the ED, which was elevated.  Troponin elevation due to AKI and has now trended down from 0.53 to 0.44.    Clinical Impression  Pt received in bed, dtr present, and pt is agreeable to PT evaluation.  Pt states that he was modified independent with ambulation with a RW, and independent with dressing and bathing.  Pt states he is still driving and gets out into the community.  Pt was able to ambulate 42ft with RW but demonstrates R foot drop - which he states is the reason he fell.  He is recommended for OPPT for further strength training, as well as balance training, and possible need for R foot AFO.    Follow Up Recommendations Outpatient PT    Equipment Recommendations  None recommended by PT    Recommendations for Other Services Other (comment) (Pt may need an order for a R foot AFO due to foot drop. )     Precautions / Restrictions Precautions Precautions: Fall Precaution Comments: 3-4 falls in the past 6 months. Pt states his R toe has been dragging for the past 2 weeks, and that is what happened when he fell this time.  Restrictions Weight Bearing Restrictions: No      Mobility  Bed Mobility Overal bed mobility: Independent                Transfers Overall transfer level: Modified independent Equipment used: Rolling walker (2 wheeled)             General transfer comment: x 2 trials.  Pt educated to use UE's to push up from the bed instead of pulling up on the RW.    Ambulation/Gait Ambulation/Gait assistance: Min guard;Min assist Ambulation Distance (Feet): 50 Feet Assistive device: Rolling walker (2 wheeled) Gait Pattern/deviations: Step-to pattern;Decreased dorsiflexion - right;Shuffle   Gait velocity interpretation: <1.8 ft/sec, indicative of risk for recurrent falls General Gait Details: Pt demonstrates R sided foot drop during gait.  Pt also c/o R sided weakness.   Stairs            Wheelchair Mobility    Modified Rankin (Stroke Patients Only)       Balance Overall balance assessment: History of Falls;Needs assistance Sitting-balance support: Bilateral upper extremity supported;Feet supported Sitting balance-Leahy Scale: Fair     Standing balance support: Bilateral upper extremity supported Standing balance-Leahy Scale: Poor                               Pertinent Vitals/Pain Pain Assessment: 0-10 Pain Score: 8  Pain Location: B LE's - just a pain Pain Intervention(s): Limited activity within patient's tolerance;Monitored during session;Repositioned    Home Living   Living Arrangements: Spouse/significant other Available Help at Discharge: Family (son comes occasionally, and dtr comes to help the pt's wife.  ) Type of Home: House Home Access: Stairs to enter   [employer] of Steps:  1 step to the back porch Home Layout: One level Home Equipment: Gilford Rile - 2 wheels      Prior Function     Gait / Transfers Assistance Needed: Pt uses the RW for ambulation. 1 for inside and 1 for outside   ADL's / Homemaking Assistance Needed: independent with dressing and bathing.          Hand Dominance   Dominant Hand: Right    Extremity/Trunk Assessment               Lower Extremity Assessment: RLE deficits/detail RLE Deficits / Details: R DF 2/5 with general R sided weakness.  MRI of the brain is normal, however pt does have a PMH of chronic back pain.        Communication    Communication: HOH  Cognition Arousal/Alertness: Awake/alert Behavior During Therapy: WFL for tasks assessed/performed Overall Cognitive Status: Within Functional Limits for tasks assessed                      General Comments      Exercises     Assessment/Plan    PT Assessment Patient needs continued PT services  PT Problem List Decreased strength;Decreased range of motion;Decreased activity tolerance;Decreased balance;Decreased mobility;Decreased coordination;Decreased knowledge of use of DME;Decreased safety awareness;Decreased knowledge of precautions;Cardiopulmonary status limiting activity;Pain          PT Treatment Interventions DME instruction;Gait training;Functional mobility training;Therapeutic activities;Therapeutic exercise;Balance training;Patient/family education    PT Goals (Current goals can be found in the Care Plan section)  Acute Rehab PT Goals Patient Stated Goal: Pt wants to go home PT Goal Formulation: With patient/family Time For Goal Achievement: 08/26/16 Potential to Achieve Goals: Fair    Frequency Min 5X/week   Barriers to discharge        Co-evaluation               End of Session Equipment Utilized During Treatment: Gait belt Activity Tolerance: Patient tolerated treatment well Patient left: with family/visitor present;in bed Nurse Communication: Mobility status (Lauren, RN notified of pt's position and mobility status.  Mobility sheet left in the room. )    Functional Assessment Tool Used: [employer]	[place] AM-PAC "6-clicks:  Functional Limitation: Mobility: Walking and moving around Mobility: Walking and Moving Around Current Status [phone]): At least 20 percent but less than 40 percent impaired, limited or restricted Mobility: Walking and Moving Around Goal Status [phone]): At least 1 percent but less than 20 percent impaired, limited or restricted    Time: 0945-1010 PT Time Calculation (min) (ACUTE ONLY): 25  min   Charges:   PT Evaluation $PT Eval Low Complexity: 1 Procedure PT Treatments $Gait Training: 8-22 mins   PT G Codes:   PT G-Codes **NOT FOR INPATIENT CLASS** Functional Assessment Tool Used: [employer] "6-clicks:  Functional Limitation: Mobility: Walking and moving around Mobility: Walking and Moving Around Current Status [phone]): At least 20 percent but less than 40 percent impaired, limited or restricted Mobility: Walking and Moving Around Goal Status [phone]): At least 1 percent but less than 20 percent impaired, limited or restricted    Beth Neyda [place], PT, DPT X: [phone]

## 2016-08-19 NOTE — Progress Notes (Signed)
*  PRELIMINARY RESULTS* Echocardiogram 2D Echocardiogram has been performed.  Leavy Cella 08/19/2016, 10:47 AM

## 2016-08-19 NOTE — Discharge Summary (Signed)
Physician Discharge Summary  Mario May [id number] DOB: [birth year] DOA: 08/18/2016  PCP: Sherrie Mustache, MD  Admit date: 08/18/2016 Discharge date: 08/19/2016  Admitted From: home Disposition:  home  Recommendations for Outpatient Follow-up:  1. Follow up with PCP in 1-2 weeks 2. Please obtain BMP/CBC in one week   Home Health: Equipment/Devices:  Discharge Condition: stable CODE STATUS: full Diet recommendation: Heart Healthy  Brief/Interim Summary: 82 y.o. male with medical history significant of essential HTN, HLD, hypothyroidism, and leukocytosis presented for evaluation status post fall. CT head and brain MRI were unremarkable. While in the ED, patient's troponin was found to be elevated at 0.3. Patient was admitted for further evaluation of elevated troponin  Discharge Diagnoses:  Principal Problem:   Elevated troponin Active Problems:   Hypothyroidism, adult   Essential hypertension   Peripheral musculoskeletal gait disorder   Gait instability   AKI (acute kidney injury) ([place])   Dehydration  1. Elevated troponin. Patient did not have any acute EKG changes. He had no chest pain or shortness of breath. Elevation of troponin was very mild and peaked to 0.5. Echocardiogram did not show any wall motion abnormalities and showed normal ejection fraction. His elevated troponin was likely related to decreased clearance in the setting of acute kidney injury. Discussed with cardiology and no further workup recommended.. 2. Acute kidney injury. Likely related to decreased by mouth intake and concurrent Lasix use. Patient received gentle hydration with some improvement of renal function. He's been advised to continue with hydration at home.. 3. Essential HTN. Pressures are stable. Continue to outpatient regimen.  Discharge Instructions  Discharge Instructions    Ambulatory referral to Physical Therapy    Complete by:  As directed    Diet - low sodium heart  healthy    Complete by:  As directed    Increase activity slowly    Complete by:  As directed        Medication List    TAKE these medications   allopurinol 100 MG tablet Commonly known as:  ZYLOPRIM Take 100 mg by mouth daily.   amLODipine 10 MG tablet Commonly known as:  NORVASC Take 1 tablet (10 mg total) by mouth daily.   aspirin 325 MG tablet Take 325 mg by mouth daily.   bisacodyl 5 MG EC tablet Commonly known as:  DULCOLAX Take 1 tablet (5 mg total) by mouth daily as needed for moderate constipation.   finasteride 5 MG tablet Commonly known as:  PROSCAR Take 5 mg by mouth.   furosemide 20 MG tablet Commonly known as:  LASIX Take 1 tablet by mouth daily.   isosorbide dinitrate 10 MG tablet Commonly known as:  ISORDIL Take 10 mg by mouth 2 (two) times daily.   levothyroxine 50 MCG tablet Commonly known as:  SYNTHROID, LEVOTHROID Take 50 mcg by mouth daily before breakfast.   LORazepam 0.5 MG tablet Commonly known as:  ATIVAN Take 1 tablet by mouth daily as needed for anxiety.   meclizine 25 MG tablet Commonly known as:  ANTIVERT Take 25 mg by mouth 3 (three) times daily as needed (inner ear problems).   polyethylene glycol packet Commonly known as:  MIRALAX / GLYCOLAX Take 17 g by mouth daily.   potassium chloride SA 20 MEQ tablet Commonly known as:  K-DUR,KLOR-CON Take 0.5 tablets (10 mEq total) by mouth daily.   traMADol 50 MG tablet Commonly known as:  ULTRAM Take 50 mg by mouth 2 (two) times daily as needed for moderate  pain.      Follow-up Information    Sherrie Mustache, MD. Schedule an appointment as soon as possible for a visit in 2 week(s).   Specialty:  Family Medicine Contact information: [place] [address] [phone]          Allergies  Allergen Reactions  . Codeine Other (See Comments)    Passes out  . Grapefruit Concentrate   . Other     Potatoes, green beans, corn  . Pineapple      Consultations:     Procedures/Studies: Dg Chest 2 View  Result Date: 08/18/2016 CLINICAL DATA:  Recent fall, weakness EXAM: CHEST  2 VIEW COMPARISON:  06/04/2016 FINDINGS: Cardiac shadow is mildly enlarged but stable. Aortic calcifications are again seen and stable. The lungs are well aerated without focal infiltrate or sizable effusion. No acute bony abnormality is seen. IMPRESSION: No acute abnormality noted. Electronically Signed   By: Inez Catalina M.D.   On: 08/18/2016 20:00   Ct Head Wo Contrast  Result Date: 08/18/2016 CLINICAL DATA:  Weakness x1 week EXAM: CT HEAD WITHOUT CONTRAST TECHNIQUE: Contiguous axial images were obtained from the base of the skull through the vertex without intravenous contrast. COMPARISON:  MRI 08/18/2016, CT scan brain 06/04/2016 FINDINGS: Brain: No evidence of acute infarction, hemorrhage, hydrocephalus, extra-axial collection or mass lesion/mass effect. Mild to moderate periventricular and deep white matter small vessel hypodensities consistent with small vessel disease. Ventricles are similar in size and morphology. Mild atrophy. Vascular: No hyperdense vessels. There are carotid artery calcifications. Skull: Normal. Negative for fracture or focal lesion. Sinuses/Orbits: Mild mucosal thickening within the ethmoid sinuses. Globes appear intact. Other: None IMPRESSION: 1. No CT evidence for acute intracranial abnormality. 2. Moderate periventricular and deep white matter small vessel ischemic changes. Electronically Signed   By: Donavan Foil M.D.   On: 08/18/2016 19:47   Mr Brain Wo Contrast (neuro Protocol)  Result Date: 08/18/2016 CLINICAL DATA:  Right lower extremity weakness. EXAM: MRI HEAD WITHOUT CONTRAST TECHNIQUE: Multiplanar, multiecho pulse sequences of the brain and surrounding structures were obtained without intravenous contrast. COMPARISON:  06/04/2016 FINDINGS: Brain: There is no evidence of acute infarct, intracranial hemorrhage, mass,  midline shift, or extra-axial fluid collection. Mild cerebral atrophy is unchanged and within normal limits for age. Patchy cerebral white matter T2 hyperintensities are unchanged and nonspecific but compatible with moderate chronic small vessel ischemic disease. A chronic lacunar infarct is noted in the left internal capsule, unchanged. Vascular: Major intracranial vascular flow voids are unchanged, with the left vertebral artery being dominant and with possibly reduced flow in the non-dominant right vertebral artery. Skull and upper cervical spine: Unremarkable bone marrow signal. Sinuses/Orbits: Unremarkable orbits. Polypoid right maxillary sinus mucosal thickening. Clear mastoid air cells. Other: None. IMPRESSION: No acute intracranial abnormality. Next item moderate chronic small vessel ischemic disease. Electronically Signed   By: Logan Bores M.D.   On: 08/18/2016 19:34   Dg Knee Complete 4 Views Left  Result Date: 08/18/2016 CLINICAL DATA:  Fall today while raking leaves, left knee pain, initial encounter EXAM: LEFT KNEE - COMPLETE 4+ VIEW COMPARISON:  None. FINDINGS: No acute fracture or dislocation is noted. Mild medial joint space narrowing is noted. No joint effusion is seen. Diffuse vascular calcifications are seen. IMPRESSION: No acute abnormality noted. Electronically Signed   By: Inez Catalina M.D.   On: 08/18/2016 20:04   Dg Knee Complete 4 Views Right  Result Date: 08/18/2016 CLINICAL DATA:  Recent fall with right knee  pain, initial encounter EXAM: RIGHT KNEE - COMPLETE 4+ VIEW COMPARISON:  None. FINDINGS: No acute fracture or dislocation is noted. Mild meniscal calcifications are seen. No joint effusion is noted. Diffuse vascular calcifications are seen. IMPRESSION: No acute abnormality noted. Electronically Signed   By: Inez Catalina M.D.   On: 08/18/2016 20:03    Echo: - Left ventricle: The cavity size was normal. Wall thickness was   increased in a pattern of moderate LVH. Systolic  function was   normal. The estimated ejection fraction was in the range of 60%   to 65%. Wall motion was normal; there were no regional wall   motion abnormalities. Features are consistent with a pseudonormal   left ventricular filling pattern, with concomitant abnormal   relaxation and increased filling pressure (grade 2 diastolic   dysfunction). - Aortic valve: Mildly calcified annulus. Trileaflet. - Mitral valve: Mildly calcified annulus. - Left atrium: The atrium was moderately dilated. - Right atrium: The atrium was moderately to severely dilated. - Tricuspid valve: There was mild regurgitation. - Pulmonary arteries: PA peak pressure: 40 mm Hg (S).   Subjective: No chest pain or shortness of breath  Discharge Exam: Vitals:   08/19/16 0609 08/19/16 1417  BP: 133/63 120/65  Pulse: 61 (!) 49  Resp: 16 18  Temp: 98.4 F (36.9 C) 97.8 F (36.6 C)   Vitals:   08/18/16 2115 08/18/16 2322 08/19/16 0609 08/19/16 1417  BP: 145/62 130/60 133/63 120/65  Pulse: (!) 59 64 61 (!) 49  Resp: 16 16 16 18   Temp:  97.8 F (36.6 C) 98.4 F (36.9 C) 97.8 F (36.6 C)  TempSrc:  Oral Oral Oral  SpO2: 99% 100% 98% 99%  Weight:  80 kg (176 lb 5.9 oz)    Height:  5\' 11"  (1.803 m)      General: Pt is alert, awake, not in acute distress Cardiovascular: RRR, S1/S2 +, no rubs, no gallops Respiratory: CTA bilaterally, no wheezing, no rhonchi Abdominal: Soft, NT, ND, bowel sounds + Extremities: no edema, no cyanosis    The results of significant diagnostics from this hospitalization (including imaging, microbiology, ancillary and laboratory) are listed below for reference.     Microbiology: No results found for this or any previous visit (from the past 240 hour(s)).   Labs: BNP (last 3 results) No results for input(s): BNP in the last 8760 hours. Basic Metabolic Panel:  Recent Labs Lab 08/18/16 1838 08/19/16 1101  NA 134* 137  K 4.2 4.3  CL 105 108  CO2 21* 23  GLUCOSE 108*  103*  BUN 34* 25*  CREATININE 1.66* 1.30*  CALCIUM 8.9 8.7*   Liver Function Tests:  Recent Labs Lab 08/18/16 1838  AST 30  ALT 11*  ALKPHOS 147*  BILITOT 0.3  PROT 7.4  ALBUMIN 3.8   No results for input(s): LIPASE, AMYLASE in the last 168 hours. No results for input(s): AMMONIA in the last 168 hours. CBC:  Recent Labs Lab 08/18/16 1838  WBC 7.2  NEUTROABS 5.9  HGB 11.6*  HCT 35.3*  MCV 94.9  PLT 253   Cardiac Enzymes:  Recent Labs Lab 08/18/16 1838 08/18/16 2326 08/19/16 0149 08/19/16 0519  TROPONINI 0.30* 0.53* 0.50* 0.44*   BNP: Invalid input(s): POCBNP CBG: No results for input(s): GLUCAP in the last 168 hours. D-Dimer No results for input(s): DDIMER in the last 72 hours. Hgb A1c No results for input(s): HGBA1C in the last 72 hours. Lipid Profile No results for input(s): CHOL, HDL,  LDLCALC, TRIG, CHOLHDL, LDLDIRECT in the last 72 hours. Thyroid function studies No results for input(s): TSH, T4TOTAL, T3FREE, THYROIDAB in the last 72 hours.  Invalid input(s): FREET3 Anemia work up No results for input(s): VITAMINB12, FOLATE, FERRITIN, TIBC, IRON, RETICCTPCT in the last 72 hours. Urinalysis    Component Value Date/Time   COLORURINE YELLOW 08/18/2016 2100   APPEARANCEUR CLEAR 08/18/2016 2100   LABSPEC 1.025 08/18/2016 2100   PHURINE 5.5 08/18/2016 2100   GLUCOSEU NEGATIVE 08/18/2016 2100   HGBUR NEGATIVE 08/18/2016 2100   [place] NEGATIVE 08/18/2016 2100   [place] NEGATIVE 08/18/2016 2100   PROTEINUR NEGATIVE 08/18/2016 2100   UROBILINOGEN 0.2 10/02/2013 2114   NITRITE NEGATIVE 08/18/2016 2100   LEUKOCYTESUR NEGATIVE 08/18/2016 2100   Sepsis Labs Invalid input(s): PROCALCITONIN,  WBC,  LACTICIDVEN Microbiology No results found for this or any previous visit (from the past 240 hour(s)).   Time coordinating discharge: Over 30 minutes  SIGNED:   Kathie Dike, MD  [facility] 08/19/2016, 5:24 PM Pager   If 7PM-7AM,  please contact night-coverage [email or web address] Password [email or web address]

## 2016-08-19 NOTE — Care Management Obs Status (Signed)
[facility] NOTIFICATION   Patient Details  Name: Mario May MRN: [id number] Date of Birth: 12/04/1914   Medicare Observation Status Notification Given:  Yes    Sherald Barge, RN 08/19/2016, 11:56 AM

## 2016-08-20 LAB — URINE CULTURE: CULTURE: NO GROWTH

## 2016-09-03 ENCOUNTER — Ambulatory Visit: Payer: Medicare Other | Admitting: Physical Therapy

## 2016-09-06 NOTE — Progress Notes (Signed)
This encounter was created in error - please disregard.

## 2016-12-21 ENCOUNTER — Encounter (HOSPITAL_COMMUNITY): Payer: Self-pay | Admitting: Emergency Medicine

## 2016-12-21 ENCOUNTER — Emergency Department (HOSPITAL_COMMUNITY)
Admission: EM | Admit: 2016-12-21 | Discharge: 2016-12-21 | Disposition: A | Discharge status: Home / Self Care | Payer: Medicare Other | Attending: Emergency Medicine | Admitting: Emergency Medicine

## 2016-12-21 DIAGNOSIS — Z87891 Personal history of nicotine dependence: Secondary | ICD-10-CM | POA: Diagnosis not present

## 2016-12-21 DIAGNOSIS — I10 Essential (primary) hypertension: Secondary | ICD-10-CM | POA: Diagnosis not present

## 2016-12-21 DIAGNOSIS — E039 Hypothyroidism, unspecified: Secondary | ICD-10-CM | POA: Insufficient documentation

## 2016-12-21 DIAGNOSIS — K0510 Chronic gingivitis, plaque induced: Secondary | ICD-10-CM | POA: Diagnosis not present

## 2016-12-21 DIAGNOSIS — Z79899 Other long term (current) drug therapy: Secondary | ICD-10-CM | POA: Diagnosis not present

## 2016-12-21 DIAGNOSIS — Z7982 Long term (current) use of aspirin: Secondary | ICD-10-CM | POA: Diagnosis not present

## 2016-12-21 DIAGNOSIS — K068 Other specified disorders of gingiva and edentulous alveolar ridge: Secondary | ICD-10-CM | POA: Diagnosis present

## 2016-12-21 NOTE — ED Provider Notes (Signed)
[facility] DEPT Provider Note   CSN: [id number] Arrival date & time: 12/21/16  5852   By signing my name below, I, Mario May, attest that this documentation has been prepared under the direction and in the presence of Milton Ferguson, MD  Electronically Signed: Delton May, ED Scribe. 12/21/16. 9:16 AM.   History   Chief Complaint Chief Complaint  Patient presents with  . Dental Problem    HPI Comments:  Mario May is a 83 y.o. male, with a PMHx of HTN and hyperlipidemia, who presents to the Emergency Department complaining of an acute onset dental problem x yesterday. Pt states he was brushing his teeth yesterday when he began to bleed from his gums. He states this happened again after drinking warm water today. No alleviating or modifying factors noted. He is on aspirin. Pt denies dental pain or any other associated symptoms. No other complaints noted.    The history is provided by the patient and a relative. No language interpreter was used.  Dental Injury  This is a new problem. The current episode started yesterday. The problem occurs constantly. The problem has not changed since onset.Nothing aggravates the symptoms. Nothing relieves the symptoms. He has tried nothing for the symptoms.    Past Medical History:  Diagnosis Date  . Anemia   . Arthritis    "just my legs" (04/06/2013)  . Assistance needed for mobility    walks with walker  . Constipation   . Gout   . HOH (hard of hearing)   . Hyperlipidemia   . Hypertension   . Hypothyroidism   . Kidney stones    "only once" (04/06/2013)  . Lumbar foraminal stenosis 06/04/2016  . 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 Rheumatism   . SIRS (systemic inflammatory response syndrome) ([place]) 02/2013   [name] 03/19/2013  (04/06/2013)  . Small bowel obstruction     Patient Active Problem List   Diagnosis Date Noted  . Dehydration 08/19/2016  . Elevated troponin 08/18/2016  . AKI (acute kidney injury)  ([place]) 08/18/2016  . Difficulty in walking, not elsewhere classified   . Gait instability   . Left leg cellulitis 06/05/2016  . Orthostatic hypotension 06/04/2016  . 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 Lumbar foraminal stenosis 06/04/2016  . DJD (degenerative joint disease), multiple sites 06/04/2016  . Fever 06/04/2016  . Leukocytosis 06/04/2016  . Stasis dermatitis of both legs 07/01/2013  . Essential hypertension 03/15/2013  . Hypothyroidism, adult 03/18/2009  . CONSTIPATION 03/18/2009  . CHANGE IN BOWELS 03/18/2009    Past Surgical History:  Procedure Laterality Date  . CYSTOSCOPY W/ STONE MANIPULATION  ?1986  . NASAL FRACTURE SURGERY    . TRANSURETHRAL RESECTION OF PROSTATE         Home Medications    Prior to Admission medications   Medication Sig Start Date End Date Taking? Authorizing Provider  allopurinol (ZYLOPRIM) 100 MG tablet Take 100 mg by mouth daily.    Historical Provider, MD  amLODipine (NORVASC) 10 MG tablet Take 1 tablet (10 mg total) by mouth daily. 04/07/13   Shanker Kristeen Mans, MD  aspirin 325 MG tablet Take 325 mg by mouth daily.    Historical Provider, MD  bisacodyl (DULCOLAX) 5 MG EC tablet Take 1 tablet (5 mg total) by mouth daily as needed for moderate constipation. 01/22/15   Gatha Mayer, MD  finasteride (PROSCAR) 5 MG tablet Take 5 mg by mouth.    Historical Provider, MD  furosemide (LASIX) 20  MG tablet Take 1 tablet by mouth daily. 05/19/16   Historical Provider, MD  isosorbide dinitrate (ISORDIL) 10 MG tablet Take 10 mg by mouth 2 (two) times daily.    Historical Provider, MD  levothyroxine (SYNTHROID, LEVOTHROID) 50 MCG tablet Take 50 mcg by mouth daily before breakfast.    Historical Provider, MD  LORazepam (ATIVAN) 0.5 MG tablet Take 1 tablet by mouth daily as needed for anxiety. 04/22/16 04/22/17  Historical Provider, MD  meclizine (ANTIVERT) 25 MG tablet Take 25 mg by mouth 3 (three) times daily as needed (inner ear problems).     Historical Provider, MD  polyethylene glycol (MIRALAX / GLYCOLAX) packet Take 17 g by mouth daily.    Historical Provider, MD  potassium chloride SA (K-DUR,KLOR-CON) 20 MEQ tablet Take 0.5 tablets (10 mEq total) by mouth daily. 06/07/16   Rexene Alberts, MD  traMADol (ULTRAM) 50 MG tablet Take 50 mg by mouth 2 (two) times daily as needed for moderate pain.     Historical Provider, MD    Family History Family History  Problem Relation Age of Onset  . Colon cancer Mother   . Prostate cancer Father   . Heart disease Father   . Colon polyps Brother     x 2  . Heart disease Brother     Social History Social History  Substance Use Topics  . Smoking status: Former Smoker    Packs/day: 0.50    Types: Cigarettes  . Smokeless tobacco: Never Used     Comment: 04/06/2013 "quit smoking ~ 45-50 yr ago"  . Alcohol use No     Comment: 04/06/2013 "last time I've had any alcohol was 09/12/1945; never had a problem w/it"     Allergies   Codeine; Grapefruit concentrate; Other; and Pineapple   Review of Systems Review of Systems  Constitutional: Negative for fever.  HENT: Positive for dental problem.   All other systems reviewed and are negative.    Physical Exam Updated Vital Signs BP (!) 156/92 (BP Location: Right Arm)   Pulse 100   Temp 97.5 F (36.4 C) (Oral)   Resp 18   Ht 5\' 11"  (1.803 m)   Wt 182 lb (82.6 kg)   SpO2 97%   BMI 25.38 kg/m   Physical Exam  Constitutional: He is oriented to person, place, and time. He appears well-developed and well-nourished. No distress.  HENT:  Head: Normocephalic and atraumatic.  Eyes: Conjunctivae are normal.  Cardiovascular: Normal rate.   Pulmonary/Chest: Effort normal.  Abdominal: He exhibits no distension.  Neurological: He is alert and oriented to person, place, and time.  Skin: Skin is warm and dry.  Psychiatric: He has a normal mood and affect.  Nursing note and vitals reviewed.    ED Treatments / Results  DIAGNOSTIC  STUDIES:  Oxygen Saturation is 97% on RA, normal by my interpretation.    COORDINATION OF CARE:  9:15 AM Discussed treatment plan with pt at bedside and pt agreed to plan.  Labs (all labs ordered are listed, but only abnormal results are displayed) Labs Reviewed - No data to display  EKG  EKG Interpretation None       Radiology No results found.  Procedures Procedures (including critical care time)  Medications Ordered in ED Medications - No data to display   Initial Impression / Assessment and Plan / ED Course  I have reviewed the triage vital signs and the nursing notes.  Pertinent labs & imaging results that were available during my  care of the patient were reviewed by me and considered in my medical decision making (see chart for details).     Patient with bleeding from his gums that has resolved. He is instructed to use a teabag if the bleeding occurs again and he is to follow-up with the dentist  Final Clinical Impressions(s) / ED Diagnoses   Final diagnoses:  None    New Prescriptions New Prescriptions   No medications on file  The chart was scribed for me under my direct supervision.  I personally performed the history, physical, and medical decision making and all procedures in the evaluation of this patient.Milton Ferguson, MD 12/21/16 [phone]

## 2016-12-21 NOTE — ED Triage Notes (Signed)
Pt reports having bleeding from mouth yesterday after brushing teeth and this morning started bleeding after drinking water.  No bleeding noted at this time.

## 2016-12-21 NOTE — Discharge Instructions (Signed)
Follow up with  a dentist.   Use  a tea bag if bleeding returns.

## 2017-01-09 ENCOUNTER — Emergency Department (HOSPITAL_COMMUNITY): Payer: Medicare Other

## 2017-01-09 ENCOUNTER — Emergency Department (HOSPITAL_COMMUNITY)
Admission: EM | Admit: 2017-01-09 | Discharge: 2017-01-09 | Disposition: A | Discharge status: Home / Self Care | Payer: Medicare Other | Attending: Emergency Medicine | Admitting: Emergency Medicine

## 2017-01-09 ENCOUNTER — Encounter (HOSPITAL_COMMUNITY): Payer: Self-pay | Admitting: Emergency Medicine

## 2017-01-09 DIAGNOSIS — Z7982 Long term (current) use of aspirin: Secondary | ICD-10-CM | POA: Diagnosis not present

## 2017-01-09 DIAGNOSIS — E039 Hypothyroidism, unspecified: Secondary | ICD-10-CM | POA: Insufficient documentation

## 2017-01-09 DIAGNOSIS — Z87891 Personal history of nicotine dependence: Secondary | ICD-10-CM | POA: Diagnosis not present

## 2017-01-09 DIAGNOSIS — Z79899 Other long term (current) drug therapy: Secondary | ICD-10-CM | POA: Insufficient documentation

## 2017-01-09 DIAGNOSIS — R109 Unspecified abdominal pain: Secondary | ICD-10-CM | POA: Diagnosis present

## 2017-01-09 DIAGNOSIS — I10 Essential (primary) hypertension: Secondary | ICD-10-CM | POA: Insufficient documentation

## 2017-01-09 LAB — COMPREHENSIVE METABOLIC PANEL
ALK PHOS: 195 U/L — AB (ref 38–126)
ALT: 9 U/L — AB (ref 17–63)
ANION GAP: 8 (ref 5–15)
AST: 25 U/L (ref 15–41)
Albumin: 3.4 g/dL — ABNORMAL LOW (ref 3.5–5.0)
BUN: 27 mg/dL — ABNORMAL HIGH (ref 6–20)
CALCIUM: 9.1 mg/dL (ref 8.9–10.3)
CHLORIDE: 106 mmol/L (ref 101–111)
CO2: 24 mmol/L (ref 22–32)
CREATININE: 1.1 mg/dL (ref 0.61–1.24)
GFR calc Af Amer: >60 mL/min (ref >60)
GFR calc non Af Amer: 53 mL/min — ABNORMAL LOW (ref >60)
GLUCOSE: 97 mg/dL (ref 65–99)
Potassium: 3.9 mmol/L (ref 3.5–5.1)
SODIUM: 138 mmol/L (ref 135–145)
Total Bilirubin: 0.8 mg/dL (ref 0.3–1.2)
Total Protein: 7.6 g/dL (ref 6.5–8.1)

## 2017-01-09 LAB — URINALYSIS, ROUTINE W REFLEX MICROSCOPIC
BILIRUBIN URINE: NEGATIVE
Glucose, UA: NEGATIVE mg/dL
Hgb urine dipstick: NEGATIVE
Ketones, ur: NEGATIVE mg/dL
Leukocytes, UA: NEGATIVE
NITRITE: NEGATIVE
PH: 7 (ref 5.0–8.0)
Protein, ur: NEGATIVE mg/dL
Specific Gravity, Urine: 1.008 (ref 1.005–1.030)

## 2017-01-09 LAB — CBC WITH DIFFERENTIAL/PLATELET
BASOS PCT: 0 %
Basophils Absolute: 0 10*3/uL (ref 0.0–0.1)
EOS ABS: 0.4 10*3/uL (ref 0.0–0.7)
Eosinophils Relative: 6 %
HCT: 35.7 % — ABNORMAL LOW (ref 39.0–52.0)
HEMOGLOBIN: 11.7 g/dL — AB (ref 13.0–17.0)
Lymphocytes Relative: 21 %
Lymphs Abs: 1.2 10*3/uL (ref 0.7–4.0)
MCH: 30.9 pg (ref 26.0–34.0)
MCHC: 32.8 g/dL (ref 30.0–36.0)
MCV: 94.2 fL (ref 78.0–100.0)
Monocytes Absolute: 0.4 10*3/uL (ref 0.1–1.0)
Monocytes Relative: 8 %
NEUTROS PCT: 65 %
Neutro Abs: 3.7 10*3/uL (ref 1.7–7.7)
Platelets: 267 10*3/uL (ref 150–400)
RBC: 3.79 MIL/uL — AB (ref 4.22–5.81)
RDW: 13.8 % (ref 11.5–15.5)
WBC: 5.7 10*3/uL (ref 4.0–10.5)

## 2017-01-09 LAB — LIPASE, BLOOD: Lipase: 22 U/L (ref 11–51)

## 2017-01-09 NOTE — Discharge Instructions (Signed)
There is not a clear cause of your back pain and abdominal pain today.  Your CT scan does show some irregularities around the pancreas. You'll need to let your primary care doctor know about this so that they can schedule an MRI to further evaluate this, and rule out cancer. Your CT report is attached.  Your blood work and urine are reassuring.   This is unlikely shingles currently given that your pain is fully resolved.  Please return without fail for worsening symptoms, including recurrent pain, intractable vomiting, fever, or any other symptoms concerning to you.

## 2017-01-09 NOTE — ED Notes (Signed)
Pt HR dropped down to 30s-40s range per monitor. Radial pulse was 48 when counted. Pt placed on 5-lead heart monitor. RN aware.

## 2017-01-09 NOTE — ED Provider Notes (Signed)
[facility] DEPT Provider Note   CSN: [id number] Arrival date & time: 01/09/17  1194     History   Chief Complaint Chief Complaint  Patient presents with  . Flank Pain    HPI Mario May is a 82 y.o. male.  The history is provided by the patient.  Flank Pain  This is a new problem. The current episode started 3 to 5 hours ago. The problem occurs constantly. The problem has been resolved. Associated symptoms include abdominal pain. Pertinent negatives include no chest pain and no shortness of breath. The symptoms are aggravated by bending, twisting and walking. Nothing relieves the symptoms. He has tried nothing for the symptoms.     83 year old male who presents with sudden onset of left flank pain radiating into the left abdomen that woke him up this morning at 3 AM. He has a history of nephrolithiasis, hypertension, hyperlipidemia, and hearing loss. States that pain was sharp, worse with movements, and lasted for 20-30 minutes before improving on its own. Currently reports minimal pain. Denies any associating dysuria, urinary frequency, hematuria, nausea or vomiting, diarrhea or constipation, melena or hematochezia, fevers or chills. Denies chest pain, difficulty breathing, syncope or near syncope. States that he wanted to make sure he did not have shingles as his wife had similar pains but waited too long to be treated. He denies any rash. Past Medical History:  Diagnosis Date  . Anemia   . Arthritis    "just my legs" (04/06/2013)  . Assistance needed for mobility    walks with walker  . Constipation   . Gout   . HOH (hard of hearing)   . Hyperlipidemia   . Hypertension   . Hypothyroidism   . Kidney stones    "only once" (04/06/2013)  . Lumbar foraminal stenosis 06/04/2016  . 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 Rheumatism   . SIRS (systemic inflammatory response syndrome) ([place]) 02/2013   Mario May 03/19/2013  (04/06/2013)  . Small bowel obstruction  [facility])     Patient Active Problem List   Diagnosis Date Noted  . Dehydration 08/19/2016  . Elevated troponin 08/18/2016  . AKI (acute kidney injury) ([place]) 08/18/2016  . Difficulty in walking, not elsewhere classified   . Gait instability   . Left leg cellulitis 06/05/2016  . Orthostatic hypotension 06/04/2016  . 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 Lumbar foraminal stenosis 06/04/2016  . DJD (degenerative joint disease), multiple sites 06/04/2016  . Fever 06/04/2016  . Leukocytosis 06/04/2016  . Stasis dermatitis of both legs 07/01/2013  . Essential hypertension 03/15/2013  . Hypothyroidism, adult 03/18/2009  . CONSTIPATION 03/18/2009  . CHANGE IN BOWELS 03/18/2009    Past Surgical History:  Procedure Laterality Date  . CYSTOSCOPY W/ STONE MANIPULATION  ?1986  . NASAL FRACTURE SURGERY    . TRANSURETHRAL RESECTION OF PROSTATE         Home Medications    Prior to Admission medications   Medication Sig Start Date End Date Taking? Authorizing Provider  allopurinol (ZYLOPRIM) 100 MG tablet Take 100 mg by mouth daily.    Historical Provider, MD  amLODipine (NORVASC) 10 MG tablet Take 1 tablet (10 mg total) by mouth daily. 04/07/13   Shanker Kristeen Mans, MD  aspirin 325 MG tablet Take 325 mg by mouth daily.    Historical Provider, MD  bisacodyl (DULCOLAX) 5 MG EC tablet Take 1 tablet (5 mg total) by mouth daily as needed for moderate constipation. 01/22/15   Gatha Mayer,  MD  finasteride (PROSCAR) 5 MG tablet Take 5 mg by mouth.    Historical Provider, MD  furosemide (LASIX) 20 MG tablet Take 1 tablet by mouth daily. 05/19/16   Historical Provider, MD  isosorbide dinitrate (ISORDIL) 10 MG tablet Take 10 mg by mouth 2 (two) times daily.    Historical Provider, MD  levothyroxine (SYNTHROID, LEVOTHROID) 50 MCG tablet Take 50 mcg by mouth daily before breakfast.    Historical Provider, MD  LORazepam (ATIVAN) 0.5 MG tablet Take 1 tablet by mouth daily as needed for  anxiety. 04/22/16 04/22/17  Historical Provider, MD  meclizine (ANTIVERT) 25 MG tablet Take 25 mg by mouth 3 (three) times daily as needed (inner ear problems).    Historical Provider, MD  polyethylene glycol (MIRALAX / GLYCOLAX) packet Take 17 g by mouth daily.    Historical Provider, MD  potassium chloride SA (K-DUR,KLOR-CON) 20 MEQ tablet Take 0.5 tablets (10 mEq total) by mouth daily. 06/07/16   Rexene Alberts, MD  traMADol (ULTRAM) 50 MG tablet Take 50 mg by mouth 2 (two) times daily as needed for moderate pain.     Historical Provider, MD    Family History Family History  Problem Relation Age of Onset  . Colon cancer Mother   . Prostate cancer Father   . Heart disease Father   . Colon polyps Brother     x 2  . Heart disease Brother     Social History Social History  Substance Use Topics  . Smoking status: Former Smoker    Packs/day: 0.50    Types: Cigarettes  . Smokeless tobacco: Never Used     Comment: 04/06/2013 "quit smoking ~ 45-50 yr ago"  . Alcohol use No     Comment: 04/06/2013 "last time I've had any alcohol was 09/12/1945; never had a problem w/it"     Allergies   Codeine; Grapefruit concentrate; Other; and Pineapple   Review of Systems Review of Systems  Constitutional: Negative for fever.  Respiratory: Negative for shortness of breath.   Cardiovascular: Negative for chest pain.  Gastrointestinal: Positive for abdominal pain.  Genitourinary: Positive for flank pain. Negative for dysuria and hematuria.  Musculoskeletal: Positive for back pain.  Allergic/Immunologic: Negative for immunocompromised state.  Neurological: Negative for syncope.  Hematological: Does not bruise/bleed easily.  Psychiatric/Behavioral: Negative for confusion.  All other systems reviewed and are negative.    Physical Exam Updated Vital Signs BP 131/73   Pulse (!) 51   Temp 97.8 F (36.6 C) (Oral)   Resp 18   Ht 5\' 11"  (1.803 m)   Wt 182 lb (82.6 kg)   SpO2 100%   BMI 25.38  kg/m   Physical Exam Physical Exam  Nursing note and vitals reviewed. Constitutional:  non-toxic, and in no acute distress Head: Normocephalic and atraumatic.  Mouth/Throat: Oropharynx is clear and moist.  Neck: Normal range of motion. Neck supple.  Cardiovascular: Normal rate and regular rhythm.   Pulmonary/Chest: Effort normal and breath sounds normal.  Abdominal: Soft. There is no tenderness. There is no rebound and no guarding.  no CVA tenderness  Musculoskeletal: Normal range of motion.  Neurological: Alert, no facial droop, fluent speech, moves all extremities symmetrically Skin: Skin is warm and dry. No rash Psychiatric: Cooperative   ED Treatments / Results  Labs (all labs ordered are listed, but only abnormal results are displayed) Labs Reviewed  CBC WITH DIFFERENTIAL/PLATELET - Abnormal; Notable for the following:       Result Value  RBC 3.79 (*)    Hemoglobin 11.7 (*)    HCT 35.7 (*)    All other components within normal limits  COMPREHENSIVE METABOLIC PANEL - Abnormal; Notable for the following:    BUN 27 (*)    Albumin 3.4 (*)    ALT 9 (*)    Alkaline Phosphatase 195 (*)    GFR calc non Af Amer 53 (*)    All other components within normal limits  URINALYSIS, ROUTINE W REFLEX MICROSCOPIC - Abnormal; Notable for the following:    Color, Urine STRAW (*)    All other components within normal limits  LIPASE, BLOOD    EKG  EKG Interpretation None       Radiology Ct Renal Stone Study  Result Date: 01/09/2017 CLINICAL DATA:  Patient with left flank pain. History of renal stones. EXAM: CT ABDOMEN AND PELVIS WITHOUT CONTRAST TECHNIQUE: Multidetector CT imaging of the abdomen and pelvis was performed following the standard protocol without IV contrast. COMPARISON:  CT abdomen pelvis 10/03/2013 FINDINGS: Lower chest: Normal heart size. Coronary arterial vascular calcifications. Subpleural ground-glass opacities within the lower lobes bilaterally, favored  represent atelectasis. No pleural effusion. Hepatobiliary: Liver is diffusely low in attenuation. Gallbladder is prominent. No wall thickening or pericholecystic fluid. No intrahepatic or extrahepatic biliary ductal dilatation. Pancreas: There is a 3.8 x 4.3 cm irregular soft tissue mass within the region of the pancreatic head/ uncinate process (image 33; series 2). Spleen: Unremarkable Adrenals/Urinary Tract: The adrenal glands are normal. There is a 3.2 cm cyst within the superior pole of the right kidney. There is a 1.4 cm exophytic cyst within the interpolar region of the left kidney. No nephroureterolithiasis. No hydronephrosis. Small posterior left lateral diverticulum off the urinary bladder. Stomach/Bowel: Stool throughout the colon. No abnormal bowel wall thickening or evidence for bowel obstruction. Normal morphology of the stomach. Vascular/Lymphatic: Normal caliber abdominal aorta. Peripheral calcified atherosclerotic plaque. No retroperitoneal lymphadenopathy. Reproductive: Central dystrophic calcifications in the prostate. Postsurgical defect in the prostate. Other: Bilateral fat containing inguinal hernias. Musculoskeletal: Lumbar spine degenerative changes. No aggressive or acute appearing osseous lesions. IMPRESSION: Irregular masslike density within the region of the pancreatic head/ uncinate process concerning for the possibility of a pancreatic neoplasm. This is incompletely evaluated without intravenous contrast material. Recommend dedicated evaluation of the pancreas with nonemergent pre and post contrast-enhanced abdominal MRI. No nephroureterolithiasis or hydronephrosis. Hepatic steatosis. Aortic atherosclerosis. These results were called by telephone at the time of interpretation on 01/09/2017 at 8:33 am to Dr. Brantley Stage , who verbally acknowledged these results. Electronically Signed   By: Lovey Newcomer M.D.   On: 01/09/2017 08:36    Procedures Procedures (including critical care  time)  Medications Ordered in ED Medications - No data to display   Initial Impression / Assessment and Plan / ED Course  I have reviewed the triage vital signs and the nursing notes.  Pertinent labs & imaging results that were available during my care of the patient were reviewed by me and considered in my medical decision making (see chart for details).     Presenting with sudden onset of left flank pain radiating to the abdomen this morning. This pain is now resolved on my evaluation. He is a soft and benign abdomen and no flank or CVA tenderness. Felt to be less likely shingles due to resolution of his symptoms. His skin is not hyperesthetic. Differential also includes musculoskeletal pain, aortic aneurysm, nephrolithiasis, pyelonephritis. His urinalysis does not show evidence of blood or  infection. Blood work is reassuring with stable hemoglobin. A CT abdomen and pelvis is performed, visualized, and does not show any evidence of nephrolithiasis or aortic aneurysm. I discussed with radiology who felt that he may have a small irregular mass in the head of the pancreas that could be concerning for malignancy. An MR was recommended as an outpatient. The findings of the study are reviewed with the patient and her daughter. They're given a copy of the CT report. They have agreed to call the PCP tomorrow morning to set up appropriate follow-up. Strict return instructions are reviewed. Understanding of all discharge instructions, and felt comfortable with the plan of care.  Final Clinical Impressions(s) / ED Diagnoses   Final diagnoses:  Flank pain    New Prescriptions New Prescriptions   No medications on file     Forde Dandy, MD 01/09/17 [phone]

## 2017-01-09 NOTE — ED Notes (Signed)
Patient has returned from CT

## 2017-01-09 NOTE — ED Triage Notes (Signed)
Patient c/o "shingle pain." Per patient has not had shingles in past and is unsure if he has had chicken pox in past. No visible rash or blisters. Per patient left flank pain this morning that woke him at 3.Patient states pain was sharp. Patient denies any urinary symptoms. Patient now states that pain is subsiding now. Concerned about shingles because wife had them in past "and waited to long to get treatment at the time and nothing could be done but for it to clear on it's own"-(doesn't want the same to happen to him).

## 2017-01-12 ENCOUNTER — Other Ambulatory Visit (HOSPITAL_COMMUNITY): Payer: Self-pay | Admitting: Family Medicine

## 2017-01-12 DIAGNOSIS — K8689 Other specified diseases of pancreas: Secondary | ICD-10-CM

## 2017-01-19 ENCOUNTER — Other Ambulatory Visit (HOSPITAL_COMMUNITY): Payer: Self-pay | Admitting: Family Medicine

## 2017-01-19 ENCOUNTER — Ambulatory Visit (HOSPITAL_COMMUNITY)
Admission: RE | Admit: 2017-01-19 | Discharge: 2017-01-19 | Disposition: A | Discharge status: Home / Self Care | Payer: Medicare Other | Source: Ambulatory Visit | Attending: Family Medicine | Admitting: Family Medicine

## 2017-01-19 DIAGNOSIS — K8689 Other specified diseases of pancreas: Secondary | ICD-10-CM

## 2017-01-19 DIAGNOSIS — R935 Abnormal findings on diagnostic imaging of other abdominal regions, including retroperitoneum: Secondary | ICD-10-CM | POA: Diagnosis not present

## 2017-01-19 DIAGNOSIS — K869 Disease of pancreas, unspecified: Secondary | ICD-10-CM | POA: Diagnosis not present

## 2017-01-19 DIAGNOSIS — K828 Other specified diseases of gallbladder: Secondary | ICD-10-CM | POA: Diagnosis not present

## 2017-01-19 MED ORDER — GADOBENATE DIMEGLUMINE 529 MG/ML IV SOLN
20.0000 mL | Freq: Once | INTRAVENOUS | Status: AC | PRN
  Administered 2017-01-19: 17 mL via INTRAVENOUS

## 2017-01-22 ENCOUNTER — Observation Stay (HOSPITAL_COMMUNITY)
Admission: EM | Admit: 2017-01-22 | Discharge: 2017-01-23 | Disposition: A | Discharge status: Home / Self Care | Payer: Medicare Other | Attending: Family Medicine | Admitting: Family Medicine

## 2017-01-22 ENCOUNTER — Encounter (HOSPITAL_COMMUNITY): Payer: Self-pay | Admitting: *Deleted

## 2017-01-22 DIAGNOSIS — Z79899 Other long term (current) drug therapy: Secondary | ICD-10-CM | POA: Insufficient documentation

## 2017-01-22 DIAGNOSIS — Z87891 Personal history of nicotine dependence: Secondary | ICD-10-CM | POA: Diagnosis not present

## 2017-01-22 DIAGNOSIS — R001 Bradycardia, unspecified: Secondary | ICD-10-CM

## 2017-01-22 DIAGNOSIS — Z7982 Long term (current) use of aspirin: Secondary | ICD-10-CM | POA: Diagnosis not present

## 2017-01-22 DIAGNOSIS — I672 Cerebral atherosclerosis: Secondary | ICD-10-CM | POA: Insufficient documentation

## 2017-01-22 DIAGNOSIS — I10 Essential (primary) hypertension: Secondary | ICD-10-CM | POA: Diagnosis present

## 2017-01-22 DIAGNOSIS — M542 Cervicalgia: Secondary | ICD-10-CM

## 2017-01-22 DIAGNOSIS — I481 Persistent atrial fibrillation: Secondary | ICD-10-CM | POA: Diagnosis not present

## 2017-01-22 DIAGNOSIS — M25512 Pain in left shoulder: Secondary | ICD-10-CM | POA: Diagnosis present

## 2017-01-22 DIAGNOSIS — M159 Polyosteoarthritis, unspecified: Secondary | ICD-10-CM | POA: Diagnosis present

## 2017-01-22 DIAGNOSIS — E039 Hypothyroidism, unspecified: Secondary | ICD-10-CM | POA: Diagnosis not present

## 2017-01-22 DIAGNOSIS — I4891 Unspecified atrial fibrillation: Secondary | ICD-10-CM | POA: Diagnosis present

## 2017-01-22 DIAGNOSIS — I4819 Other persistent atrial fibrillation: Secondary | ICD-10-CM

## 2017-01-22 NOTE — ED Triage Notes (Signed)
Pt states he was changing clothes tonight and when he raised his arms to take off his shirt he began having severe left shoulder pain

## 2017-01-22 NOTE — ED Provider Notes (Signed)
[facility] DEPT Provider Note   CSN: [id number] Arrival date & time: 01/22/17  2337   By signing my name below, I, Charolotte Eke, attest that this documentation has been prepared under the direction and in the presence of Najia Hurlbutt, Annie Main, MD. Electronically Signed: Charolotte Eke, Scribe. 01/22/17. 12:01 AM.    History   Chief Complaint Chief Complaint  Patient presents with  . Shoulder Pain    HPI Mario May is a 83 y.o. male with HTN, HOH, brought in by ambulance, who presents to the Emergency Department complaining of chronic, worsening left shoulder pain that began around 10pm tonight s/p getting dressed. Pt says that his left arm will not hold him up. Movement exacerbates the pain. The pain radiates up his neck and down his arm. Pt has no other associated symptoms. Pt denies falling, LOC, head injury. Pt was given tramadol for it before. PCP says that it is arthritis. Pt denies headache chest pain. Pt is hard of hearing and not a perfect historian. Pt is on ASA, but no blood thinners. Pt denies blurry vision. Pt does not have a h/o MI, CVA. Pt was seen for this pain at ED and was given tramadol. Pt had tylenol arthritis PTA with limited relief. Daughter reports pain has been ongoing for months and worsened tonight.  Tramadol initially helped but his PCP would no longer prescribe it.    The history is provided by the patient. No language interpreter was used.    Past Medical History:  Diagnosis Date  . Anemia   . Arthritis    "just my legs" (04/06/2013)  . Assistance needed for mobility    walks with walker  . Constipation   . Gout   . HOH (hard of hearing)   . Hyperlipidemia   . Hypertension   . Hypothyroidism   . Kidney stones    "only once" (04/06/2013)  . Lumbar foraminal stenosis 06/04/2016  . 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 Rheumatism   . SIRS (systemic inflammatory response syndrome) ([place]) 02/2013   [name] 03/19/2013  (04/06/2013)  . Small  bowel obstruction [facility])     Patient Active Problem List   Diagnosis Date Noted  . Dehydration 08/19/2016  . Elevated troponin 08/18/2016  . AKI (acute kidney injury) ([place]) 08/18/2016  . Difficulty in walking, not elsewhere classified   . Gait instability   . Left leg cellulitis 06/05/2016  . Orthostatic hypotension 06/04/2016  . 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 Lumbar foraminal stenosis 06/04/2016  . DJD (degenerative joint disease), multiple sites 06/04/2016  . Fever 06/04/2016  . Leukocytosis 06/04/2016  . Stasis dermatitis of both legs 07/01/2013  . Essential hypertension 03/15/2013  . Hypothyroidism, adult 03/18/2009  . CONSTIPATION 03/18/2009  . CHANGE IN BOWELS 03/18/2009    Past Surgical History:  Procedure Laterality Date  . CYSTOSCOPY W/ STONE MANIPULATION  ?1986  . NASAL FRACTURE SURGERY    . TRANSURETHRAL RESECTION OF PROSTATE         Home Medications    Prior to Admission medications   Medication Sig Start Date End Date Taking? Authorizing Provider  allopurinol (ZYLOPRIM) 100 MG tablet Take 100 mg by mouth daily.    [provider]  amLODipine (NORVASC) 10 MG tablet Take 1 tablet (10 mg total) by mouth daily. 04/07/13   Ghimire, Henreitta Leber, MD  aspirin 325 MG tablet Take 325 mg by mouth daily.    [provider]  bisacodyl (DULCOLAX) 5 MG EC tablet Take  1 tablet (5 mg total) by mouth daily as needed for moderate constipation. 01/22/15   Gatha Mayer, MD  finasteride (PROSCAR) 5 MG tablet Take 5 mg by mouth.    [provider]  furosemide (LASIX) 20 MG tablet Take 1 tablet by mouth daily. 05/19/16   [provider]  isosorbide dinitrate (ISORDIL) 10 MG tablet Take 10 mg by mouth 2 (two) times daily.    [provider]  levothyroxine (SYNTHROID, LEVOTHROID) 50 MCG tablet Take 50 mcg by mouth daily before breakfast.    [provider]  LORazepam (ATIVAN) 0.5 MG tablet Take 1 tablet by  mouth daily as needed for anxiety. 04/22/16 04/22/17  [provider]  meclizine (ANTIVERT) 25 MG tablet Take 25 mg by mouth 3 (three) times daily as needed (inner ear problems).    [provider]  polyethylene glycol (MIRALAX / GLYCOLAX) packet Take 17 g by mouth daily.    [provider]  potassium chloride SA (K-DUR,KLOR-CON) 20 MEQ tablet Take 0.5 tablets (10 mEq total) by mouth daily. 06/07/16   Rexene Alberts, MD  traMADol (ULTRAM) 50 MG tablet Take 50 mg by mouth 2 (two) times daily as needed for moderate pain.     [provider]    Family History Family History  Problem Relation Age of Onset  . Colon cancer Mother   . Prostate cancer Father   . Heart disease Father   . Colon polyps Brother     x 2  . Heart disease Brother     Social History Social History  Substance Use Topics  . Smoking status: Former Smoker    Packs/day: 0.50    Types: Cigarettes  . Smokeless tobacco: Never Used     Comment: 04/06/2013 "quit smoking ~ 45-50 yr ago"  . Alcohol use No     Comment: 04/06/2013 "last time I've had any alcohol was 09/12/1945; never had a problem w/it"     Allergies   Codeine; Grapefruit concentrate; Other; and Pineapple   Review of Systems Review of Systems A complete 10 system review of systems was obtained and all systems are negative except as noted in the HPI and PMH.    Physical Exam Updated Vital Signs BP 126/81   Pulse (!) 50   Temp 97.8 F (36.6 C) (Oral)   Resp 16   Ht 5\' 11"  (1.803 m)   Wt 182 lb (82.6 kg)   SpO2 97%   BMI 25.38 kg/m   Physical Exam  Constitutional: He is oriented to person, place, and time. He appears well-developed and well-nourished. No distress.  HENT:  Head: Normocephalic and atraumatic.  Mouth/Throat: Oropharynx is clear and moist. No oropharyngeal exudate.  Eyes: Conjunctivae and EOM are normal. Pupils are equal, round, and reactive to light.  Neck: Normal range of motion. Neck supple.  No  meningismus.  Cardiovascular: Normal rate, regular rhythm, normal heart sounds and intact distal pulses.   No murmur heard. Pulmonary/Chest: Effort normal and breath sounds normal. No respiratory distress.  Abdominal: Soft. There is no tenderness. There is no rebound and no guarding.  Musculoskeletal: Normal range of motion. He exhibits tenderness. He exhibits no edema.  Left paraspinal cervical left trapezius and rhomboid.  Neurological: He is alert and oriented to person, place, and time. No cranial nerve deficit. He exhibits normal muscle tone. Coordination normal.   5/5 strength throughout. CN 2-12 intact.Equal grip strength. Able to hold arms against gravity. Equal grip strengths and radial pulses.  Skin: Skin is warm.  Psychiatric: He has a normal mood and affect. His behavior is normal.  Nursing note and vitals reviewed.    ED Treatments / Results   DIAGNOSTIC STUDIES: Oxygen Saturation is 100% on room air, normal by my interpretation.    COORDINATION OF CARE: 11:51 PM Discussed treatment plan with pt at bedside and pt agreed to plan.    Labs (all labs ordered are listed, but only abnormal results are displayed) Labs Reviewed  CBC WITH DIFFERENTIAL/PLATELET - Abnormal; Notable for the following:       Result Value   RBC 3.57 (*)    Hemoglobin 11.1 (*)    HCT 33.4 (*)    All other components within normal limits  BASIC METABOLIC PANEL - Abnormal; Notable for the following:    Glucose, Bld 123 (*)    BUN 39 (*)    Creatinine, Ser 1.34 (*)    GFR calc non Af Amer 42 (*)    GFR calc Af Amer 49 (*)    All other components within normal limits  TROPONIN I  MAGNESIUM  TROPONIN I    EKG  EKG Interpretation  Date/Time:  Sunday Jan 23 2017 00:06:27 EDT Ventricular Rate:  51 PR Interval:    QRS Duration: 89 QT Interval:  433 QTC Calculation: 399 R Axis:   14 Text Interpretation:  Atrial fibrillation new atrial fibrillation  Confirmed by Wyvonnia Dusky  MD, Harris Kistler  [phone]) on 01/23/2017 12:17:21 AM       Radiology Ct Angio Head W Or Wo Contrast  Result Date: 01/23/2017 CLINICAL DATA:  Severe LEFT shoulder and neck pain while changing clothes tonight. History of hypertension, hyperlipidemia. EXAM: CT ANGIOGRAPHY HEAD AND NECK TECHNIQUE: Multidetector CT imaging of the head and neck was performed using the standard protocol during bolus administration of intravenous contrast. Multiplanar CT image reconstructions and MIPs were obtained to evaluate the vascular anatomy. Carotid stenosis measurements (when applicable) are obtained utilizing NASCET criteria, using the distal internal carotid diameter as the denominator. CONTRAST:  75 cc Isovue 370 COMPARISON:  CT HEAD August 18, 2016 FINDINGS: CT HEAD FINDINGS BRAIN: No intraparenchymal hemorrhage, mass effect nor midline shift. The ventricles and sulci are normal for age. Patchy supratentorial white matter hypodensities within normal range for patient's age, though non-specific are most compatible with chronic small vessel ischemic disease. No acute large vascular territory infarcts. No abnormal extra-axial fluid collections. Basal cisterns are patent. VASCULAR: Moderate calcific atherosclerosis of the carotid siphons. SKULL: No skull fracture. No significant scalp soft tissue swelling. SINUSES/ORBITS: The mastoid air-cells and included paranasal sinuses are well-aerated.The included ocular globes and orbital contents are non-suspicious. OTHER: None. CTA NECK AORTIC ARCH: Normal appearance of the thoracic arch, normal branch pattern. Moderate calcific atherosclerosis. Severe stenosis RIGHT subclavian artery origin with tandem mild stenosis remaining RIGHT subclavian artery. Mild tandem stenosis LEFT subclavian artery. The origins of the innominate, left Common carotid artery and subclavian artery are widely patent. RIGHT CAROTID SYSTEM: Common carotid artery is widely patent. Normal appearance of the carotid bifurcation  without hemodynamically significant stenosis by NASCET criteria, trace calcific atherosclerosis. Widely patent cervical internal carotid artery of mild luminal regularity and patulous appearance favoring fibromuscular dysplasia, less likely atherosclerosis. LEFT CAROTID SYSTEM: Common carotid artery is widely patent. Normal appearance of the carotid bifurcation without hemodynamically significant stenosis by NASCET criteria, moderate eccentric intimal thickening and calcific atherosclerosis. Normal appearance of the included internal carotid artery. VERTEBRAL ARTERIES:Left vertebral artery is dominant. Normal chronic appearing dissects and  RIGHT P1 segment with moderate luminal irregularity RIGHT vertebral artery, no flow limiting stenosis. LEFT vertebral artery is widely patent. SKELETON: No acute osseous process though bone windows have not been submitted. Multilevel severe degenerative change of the cervical spine. Severe RIGHT C3-4, bilateral C4-5, C5-6, C6-7 neural foraminal narrowing. OTHER NECK: Soft tissues of the neck are non-acute though, not tailored for evaluation. Sub solid LEFT upper lobe 4 mm pulmonary nodule, no indicated follow-up. Minimal apical calcified pleural plaques. CTA HEAD ANTERIOR CIRCULATION: Patent cervical internal carotid arteries, petrous, cavernous and supra clinoid internal carotid arteries. Widely patent anterior communicating artery. Patent anterior and middle cerebral arteries. Mild luminal irregularity compatible with atherosclerosis middle cerebral arteries. No large vessel occlusion, hemodynamically significant stenosis, dissection, contrast extravasation or aneurysm. POSTERIOR CIRCULATION: Patent vertebral arteries, vertebrobasilar junction and basilar artery, as well as main branch vessels. Moderate tandem stenoses RIGHT intradural vertebral artery. Patent posterior cerebral arteries. Mild stenosis LEFT distal P1 segment. No large vessel occlusion, hemodynamically  significant stenosis, dissection, contrast extravasation or aneurysm. VENOUS SINUSES: Major dural venous sinuses are patent though not tailored for evaluation on this angiographic examination. ANATOMIC VARIANTS: None. DELAYED PHASE: No abnormal intracranial enhancement. MIP images reviewed. IMPRESSION: CT HEAD: No acute intracranial process; negative noncontrast CT HEAD for age. CTA NECK: No hemodynamically significant stenosis or acute vascular process with particular attention to the LEFT vertebral artery. Old RIGHT vertebral artery non flow limiting dissection. Severe stenosis RIGHT subclavian artery origin. Severe C3-4 through C6-7 neural foraminal narrowing. CTA HEAD:  No emergent large vessel occlusion or severe stenosis. Moderate tandem stenoses RIGHT V4 segment compatible with atherosclerosis. Electronically Signed   By: Elon Alas M.D.   On: 01/23/2017 03:24   Dg Cervical Spine Complete  Result Date: 01/23/2017 CLINICAL DATA:  Left-sided neck pain EXAM: CERVICAL SPINE - COMPLETE 4+ VIEW COMPARISON:  06/04/2016 FINDINGS: Suboptimal visualization of the dens and lateral masses. The lung apices appear clear. Trace anterolisthesis of C2 on C3. Moderate severe degenerative changes at C4-C5, C5-C6 and C6-C7. Apparent widening of the atlantoaxial articulation measuring up to 7.6 mm with offset of the spinal laminar line. Prevertebral soft tissue thickness is normal. IMPRESSION: 1. Inadequate evaluation of the dens and lateral masses. 2. Widened appearance between the anterior arch of C1 and the tip of the dens, measuring up to 7.6 mm. If ligamentous injury is suspected, MRI would be recommended for further evaluation. CT could be utilized to assess for fracture if there is suspicion for acute osseous injury. Electronically Signed   By: Donavan Foil M.D.   On: 01/23/2017 01:31   Ct Angio Neck W And/or Wo Contrast  Result Date: 01/23/2017 CLINICAL DATA:  Severe LEFT shoulder and neck pain while  changing clothes tonight. History of hypertension, hyperlipidemia. EXAM: CT ANGIOGRAPHY HEAD AND NECK TECHNIQUE: Multidetector CT imaging of the head and neck was performed using the standard protocol during bolus administration of intravenous contrast. Multiplanar CT image reconstructions and MIPs were obtained to evaluate the vascular anatomy. Carotid stenosis measurements (when applicable) are obtained utilizing NASCET criteria, using the distal internal carotid diameter as the denominator. CONTRAST:  75 cc Isovue 370 COMPARISON:  CT HEAD August 18, 2016 FINDINGS: CT HEAD FINDINGS BRAIN: No intraparenchymal hemorrhage, mass effect nor midline shift. The ventricles and sulci are normal for age. Patchy supratentorial white matter hypodensities within normal range for patient's age, though non-specific are most compatible with chronic small vessel ischemic disease. No acute large vascular territory infarcts. No abnormal extra-axial fluid collections. Basal  cisterns are patent. VASCULAR: Moderate calcific atherosclerosis of the carotid siphons. SKULL: No skull fracture. No significant scalp soft tissue swelling. SINUSES/ORBITS: The mastoid air-cells and included paranasal sinuses are well-aerated.The included ocular globes and orbital contents are non-suspicious. OTHER: None. CTA NECK AORTIC ARCH: Normal appearance of the thoracic arch, normal branch pattern. Moderate calcific atherosclerosis. Severe stenosis RIGHT subclavian artery origin with tandem mild stenosis remaining RIGHT subclavian artery. Mild tandem stenosis LEFT subclavian artery. The origins of the innominate, left Common carotid artery and subclavian artery are widely patent. RIGHT CAROTID SYSTEM: Common carotid artery is widely patent. Normal appearance of the carotid bifurcation without hemodynamically significant stenosis by NASCET criteria, trace calcific atherosclerosis. Widely patent cervical internal carotid artery of mild luminal regularity  and patulous appearance favoring fibromuscular dysplasia, less likely atherosclerosis. LEFT CAROTID SYSTEM: Common carotid artery is widely patent. Normal appearance of the carotid bifurcation without hemodynamically significant stenosis by NASCET criteria, moderate eccentric intimal thickening and calcific atherosclerosis. Normal appearance of the included internal carotid artery. VERTEBRAL ARTERIES:Left vertebral artery is dominant. Normal chronic appearing dissects and RIGHT P1 segment with moderate luminal irregularity RIGHT vertebral artery, no flow limiting stenosis. LEFT vertebral artery is widely patent. SKELETON: No acute osseous process though bone windows have not been submitted. Multilevel severe degenerative change of the cervical spine. Severe RIGHT C3-4, bilateral C4-5, C5-6, C6-7 neural foraminal narrowing. OTHER NECK: Soft tissues of the neck are non-acute though, not tailored for evaluation. Sub solid LEFT upper lobe 4 mm pulmonary nodule, no indicated follow-up. Minimal apical calcified pleural plaques. CTA HEAD ANTERIOR CIRCULATION: Patent cervical internal carotid arteries, petrous, cavernous and supra clinoid internal carotid arteries. Widely patent anterior communicating artery. Patent anterior and middle cerebral arteries. Mild luminal irregularity compatible with atherosclerosis middle cerebral arteries. No large vessel occlusion, hemodynamically significant stenosis, dissection, contrast extravasation or aneurysm. POSTERIOR CIRCULATION: Patent vertebral arteries, vertebrobasilar junction and basilar artery, as well as main branch vessels. Moderate tandem stenoses RIGHT intradural vertebral artery. Patent posterior cerebral arteries. Mild stenosis LEFT distal P1 segment. No large vessel occlusion, hemodynamically significant stenosis, dissection, contrast extravasation or aneurysm. VENOUS SINUSES: Major dural venous sinuses are patent though not tailored for evaluation on this angiographic  examination. ANATOMIC VARIANTS: None. DELAYED PHASE: No abnormal intracranial enhancement. MIP images reviewed. IMPRESSION: CT HEAD: No acute intracranial process; negative noncontrast CT HEAD for age. CTA NECK: No hemodynamically significant stenosis or acute vascular process with particular attention to the LEFT vertebral artery. Old RIGHT vertebral artery non flow limiting dissection. Severe stenosis RIGHT subclavian artery origin. Severe C3-4 through C6-7 neural foraminal narrowing. CTA HEAD:  No emergent large vessel occlusion or severe stenosis. Moderate tandem stenoses RIGHT V4 segment compatible with atherosclerosis. Electronically Signed   By: Elon Alas M.D.   On: 01/23/2017 03:24   Dg Shoulder Left  Result Date: 01/23/2017 CLINICAL DATA:  Sudden onset severe left shoulder pain while changing close tonight. EXAM: LEFT SHOULDER - 2+ VIEW COMPARISON:  None. FINDINGS: Negative for acute fracture or dislocation. There is chronic arthropathy at the shoulder, with superior subluxation of the humeral head to the acromial undersurface and with moderate subchondral sclerosis. This probably represents chronic rotator cuff arthropathy. No bone lesion or bony destruction. IMPRESSION: Chronic arthropathy at the shoulder.  No acute findings. Electronically Signed   By: Andreas Newport M.D.   On: 01/23/2017 01:26    Procedures Procedures (including critical care time)  Medications Ordered in ED Medications  traMADol (ULTRAM) tablet 50 mg (50 mg Oral Given 01/23/17  0228)  iopamidol (ISOVUE-370) 76 % injection 100 mL (75 mLs Intravenous Contrast Given 01/23/17 0300)     Initial Impression / Assessment and Plan / ED Course  I have reviewed the triage vital signs and the nursing notes.  Pertinent labs & imaging results that were available during my care of the patient were reviewed by me and considered in my medical decision making (see chart for details).     Patient complains of acutely  worsening left sided neck and upper back pain that he's had for several months per his family but became worse tonight when he was changing his shirt. Denies chest pain or shortness of breath. No focal weakness, numbness or tingling.  EKG shows new atrial fibrillation. Family is not aware of this diagnosis.  No focal neurological deficits. Stroke screen is negative.  This patients CHA2DS2-VASc Score and unadjusted Ischemic Stroke Rate (% per year) is equal to 3.2 % stroke rate/year from a score of 3  Above score calculated as 1 point each if present [CHF, HTN, DM, Vascular=MI/PAD/Aortic Plaque, Age if 65-74, or Male] Above score calculated as 2 points each if present [Age > 75, or Stroke/TIA/TE]  CTA negative for acute pathology of L vertebral artery.    Risks and benefits of anticoagulation d/w patient and daughter.  They do not wish to start anticoagulation at this time and will discuss with PCP.  They understand the 3-4% annual risk of stroke.   CTA results d/w Dr. Cheral Marker of neurology.  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 Anticoagulation would be indicated due to Afib.   Patient continues to have afib rates 30-40s.  Denies dizziness or lightheadedness. Observation admission d/w Dr. Darrick Meigs.  Final Clinical Impressions(s) / ED Diagnoses   Final diagnoses:  Bradycardia  Persistent atrial fibrillation (HCC)  Neck pain on left side    New Prescriptions New Prescriptions   No medications on file   I personally performed the services described in this documentation, which was scribed in my presence. The recorded information has been reviewed and is accurate.     Ezequiel Essex, MD 01/23/17 0730

## 2017-01-23 ENCOUNTER — Encounter (HOSPITAL_COMMUNITY): Payer: Self-pay | Admitting: *Deleted

## 2017-01-23 ENCOUNTER — Emergency Department (HOSPITAL_COMMUNITY): Payer: Medicare Other

## 2017-01-23 DIAGNOSIS — M150 Primary generalized (osteo)arthritis: Secondary | ICD-10-CM

## 2017-01-23 DIAGNOSIS — R001 Bradycardia, unspecified: Secondary | ICD-10-CM | POA: Diagnosis present

## 2017-01-23 DIAGNOSIS — I4891 Unspecified atrial fibrillation: Secondary | ICD-10-CM

## 2017-01-23 DIAGNOSIS — I481 Persistent atrial fibrillation: Principal | ICD-10-CM

## 2017-01-23 DIAGNOSIS — I10 Essential (primary) hypertension: Secondary | ICD-10-CM

## 2017-01-23 DIAGNOSIS — E039 Hypothyroidism, unspecified: Secondary | ICD-10-CM | POA: Diagnosis not present

## 2017-01-23 DIAGNOSIS — M542 Cervicalgia: Secondary | ICD-10-CM

## 2017-01-23 LAB — CBC WITH DIFFERENTIAL/PLATELET
BASOS ABS: 0 10*3/uL (ref 0.0–0.1)
BASOS PCT: 0 %
EOS ABS: 0.4 10*3/uL (ref 0.0–0.7)
EOS PCT: 8 %
HCT: 33.4 % — ABNORMAL LOW (ref 39.0–52.0)
Hemoglobin: 11.1 g/dL — ABNORMAL LOW (ref 13.0–17.0)
LYMPHS ABS: 1.5 10*3/uL (ref 0.7–4.0)
Lymphocytes Relative: 27 %
MCH: 31.1 pg (ref 26.0–34.0)
MCHC: 33.2 g/dL (ref 30.0–36.0)
MCV: 93.6 fL (ref 78.0–100.0)
Monocytes Absolute: 0.5 10*3/uL (ref 0.1–1.0)
Monocytes Relative: 9 %
NEUTROS PCT: 56 %
Neutro Abs: 3 10*3/uL (ref 1.7–7.7)
PLATELETS: 267 10*3/uL (ref 150–400)
RBC: 3.57 MIL/uL — AB (ref 4.22–5.81)
RDW: 13.8 % (ref 11.5–15.5)
WBC: 5.4 10*3/uL (ref 4.0–10.5)

## 2017-01-23 LAB — BASIC METABOLIC PANEL
ANION GAP: 7 (ref 5–15)
BUN: 39 mg/dL — AB (ref 6–20)
CHLORIDE: 107 mmol/L (ref 101–111)
CO2: 25 mmol/L (ref 22–32)
Calcium: 8.9 mg/dL (ref 8.9–10.3)
Creatinine, Ser: 1.34 mg/dL — ABNORMAL HIGH (ref 0.61–1.24)
GFR, EST AFRICAN AMERICAN: 49 mL/min — AB (ref >60)
GFR, EST NON AFRICAN AMERICAN: 42 mL/min — AB (ref >60)
Glucose, Bld: 123 mg/dL — ABNORMAL HIGH (ref 65–99)
POTASSIUM: 3.7 mmol/L (ref 3.5–5.1)
SODIUM: 139 mmol/L (ref 135–145)

## 2017-01-23 LAB — MAGNESIUM: MAGNESIUM: 1.9 mg/dL (ref 1.7–2.4)

## 2017-01-23 LAB — TROPONIN I

## 2017-01-23 LAB — TSH: TSH: 0.528 u[IU]/mL (ref 0.350–4.500)

## 2017-01-23 MED ORDER — TRAMADOL HCL 50 MG PO TABS
50.0000 mg | ORAL_TABLET | Freq: Once | ORAL | Status: AC
  Administered 2017-01-23: 50 mg via ORAL
  Filled 2017-01-23: qty 1

## 2017-01-23 MED ORDER — LORAZEPAM 0.5 MG PO TABS: 0.5000 mg | ORAL_TABLET | Freq: Every day | ORAL | Status: DC | PRN

## 2017-01-23 MED ORDER — METHOCARBAMOL 500 MG PO TABS: 500.0000 mg | ORAL_TABLET | Freq: Four times a day (QID) | ORAL | Status: DC | PRN

## 2017-01-23 MED ORDER — ALLOPURINOL 100 MG PO TABS
100.0000 mg | ORAL_TABLET | Freq: Every day | ORAL | Status: DC
  Administered 2017-01-23: 100 mg via ORAL
  Filled 2017-01-23: qty 1

## 2017-01-23 MED ORDER — ENOXAPARIN SODIUM 40 MG/0.4ML ~~LOC~~ SOLN
40.0000 mg | SUBCUTANEOUS | Status: DC
  Filled 2017-01-23: qty 0.4

## 2017-01-23 MED ORDER — POLYETHYLENE GLYCOL 3350 17 G PO PACK
17.0000 g | PACK | Freq: Every day | ORAL | Status: DC
  Filled 2017-01-23: qty 1

## 2017-01-23 MED ORDER — METHOCARBAMOL 500 MG PO TABS: 500.0000 mg | ORAL_TABLET | Freq: Four times a day (QID) | ORAL | 0 refills | Status: DC | PRN

## 2017-01-23 MED ORDER — IOPAMIDOL (ISOVUE-370) INJECTION 76%
100.0000 mL | Freq: Once | INTRAVENOUS | Status: AC | PRN
  Administered 2017-01-23: 75 mL via INTRAVENOUS

## 2017-01-23 MED ORDER — LEVOTHYROXINE SODIUM 50 MCG PO TABS
50.0000 ug | ORAL_TABLET | Freq: Every day | ORAL | Status: DC
  Administered 2017-01-23: 50 ug via ORAL
  Filled 2017-01-23: qty 1

## 2017-01-23 MED ORDER — TRAMADOL HCL 50 MG PO TABS: 50.0000 mg | ORAL_TABLET | Freq: Four times a day (QID) | ORAL | 1 refills | Status: DC | PRN

## 2017-01-23 MED ORDER — SODIUM CHLORIDE 0.9 % IV SOLN: INTRAVENOUS | Status: DC

## 2017-01-23 MED ORDER — AMLODIPINE BESYLATE 5 MG PO TABS
10.0000 mg | ORAL_TABLET | Freq: Every day | ORAL | Status: DC
  Administered 2017-01-23: 10 mg via ORAL
  Filled 2017-01-23: qty 2

## 2017-01-23 MED ORDER — ACETAMINOPHEN 325 MG PO TABS: 650.0000 mg | ORAL_TABLET | Freq: Four times a day (QID) | ORAL | Status: DC | PRN

## 2017-01-23 MED ORDER — TRAMADOL HCL 50 MG PO TABS
50.0000 mg | ORAL_TABLET | Freq: Four times a day (QID) | ORAL | Status: DC | PRN
  Administered 2017-01-23: 50 mg via ORAL
  Filled 2017-01-23: qty 1

## 2017-01-23 MED ORDER — FUROSEMIDE 20 MG PO TABS
20.0000 mg | ORAL_TABLET | Freq: Every day | ORAL | Status: DC
  Administered 2017-01-23: 20 mg via ORAL
  Filled 2017-01-23: qty 1

## 2017-01-23 MED ORDER — POTASSIUM CHLORIDE CRYS ER 10 MEQ PO TBCR
10.0000 meq | EXTENDED_RELEASE_TABLET | Freq: Every day | ORAL | Status: DC
  Administered 2017-01-23: 10 meq via ORAL
  Filled 2017-01-23: qty 1

## 2017-01-23 MED ORDER — FINASTERIDE 5 MG PO TABS
5.0000 mg | ORAL_TABLET | Freq: Every day | ORAL | Status: DC
  Filled 2017-01-23 (×2): qty 1

## 2017-01-23 MED ORDER — ACETAMINOPHEN 325 MG PO TABS
650.0000 mg | ORAL_TABLET | Freq: Once | ORAL | Status: AC
  Administered 2017-01-23: 650 mg via ORAL
  Filled 2017-01-23: qty 2

## 2017-01-23 MED ORDER — ISOSORBIDE DINITRATE 20 MG PO TABS
10.0000 mg | ORAL_TABLET | Freq: Two times a day (BID) | ORAL | Status: DC
  Administered 2017-01-23: 10 mg via ORAL
  Filled 2017-01-23: qty 1

## 2017-01-23 MED ORDER — ACETAMINOPHEN 650 MG RE SUPP: 650.0000 mg | Freq: Four times a day (QID) | RECTAL | Status: DC | PRN

## 2017-01-23 MED ORDER — ASPIRIN 325 MG PO TABS
325.0000 mg | ORAL_TABLET | Freq: Every day | ORAL | Status: DC
  Administered 2017-01-23: 325 mg via ORAL
  Filled 2017-01-23: qty 1

## 2017-01-23 NOTE — ED Notes (Signed)
Daughter- [id number]  [phone]

## 2017-01-23 NOTE — Discharge Summary (Signed)
Physician Discharge Summary  Mario May JQZ:[id number] DOB: [birth year] DOA: 01/22/2017  PCP: Dione Housekeeper, MD  Admit date: 01/22/2017 Discharge date: 01/23/2017  Admitted From: home Disposition:  home  Recommendations for Outpatient Follow-up:  1. Follow up with PCP in 1-2 weeks 2. Please obtain BMP/CBC in one week 3. Patient is referred to electrophysiology clinic. 4. He'll be set up with 48 hour cardiac monitor.  Home Health: Equipment/Devices:  Discharge Condition: stable CODE STATUS: Partial code, no intubation Diet recommendation: Heart Healthy  Brief/Interim Summary: This is a 83 year old male who presented to the hospital with complaints of left neck and left shoulder pain. This appears to be a chronic issue. Imaging was done which essentially showed degenerative disease. While in the emergency room, on telemetry he was noted to be bradycardic in atrial fibrillation. Patient was not having any significant chest pain, lightheadedness or dizziness. He was admitted for observation. He will defer a negative cardiac markers. TSH was also noted to be normal. He is not on a beta blocker. Heart rate remained in the 40s and occasionally dipping down into the 30s. He did have occasional pauses of 2.8 seconds. Patient remained asymptomatic with this. This case was discussed with Dr. Oval Linsey on call for cardiology who felt that patient may benefit from referral to electrophysiology clinic. Since he is not having any symptoms right now, it was not felt the patient needed to stay in the hospital. Recommendations were for outpatient follow-up with a lecture physiology and 48 hour cardiac monitor. Will ask cardiology clinic to set this up. Regarding his atrial flutter, he does not appear to be a candidate for anticoagulation at this time. Family is also reluctant to start on anticoagulation until follow-up in the clinic. Continue aspirin for now. Patient is otherwise stable for  discharge.  Discharge Diagnoses:  Active Problems:   Hypothyroidism, adult   Essential hypertension   DJD (degenerative joint disease), multiple sites   Bradycardia   Atrial fibrillation [facility])    Discharge Instructions  Discharge Instructions    Amb referral to AFIB Clinic    Complete by:  As directed    Diet - low sodium heart healthy    Complete by:  As directed    Increase activity slowly    Complete by:  As directed      Allergies as of 01/23/2017      Reactions   Codeine Other (See Comments)   Passes out   Grapefruit Concentrate    Other    Potatoes, green beans, corn   Pineapple       Medication List    TAKE these medications   allopurinol 100 MG tablet Commonly known as:  ZYLOPRIM Take 100 mg by mouth daily.   amLODipine 10 MG tablet Commonly known as:  NORVASC Take 1 tablet (10 mg total) by mouth daily.   aspirin 325 MG tablet Take 325 mg by mouth daily.   finasteride 5 MG tablet Commonly known as:  PROSCAR Take 5 mg by mouth.   furosemide 20 MG tablet Commonly known as:  LASIX Take 1 tablet by mouth daily.   isosorbide dinitrate 10 MG tablet Commonly known as:  ISORDIL Take 10 mg by mouth 2 (two) times daily.   levothyroxine 50 MCG tablet Commonly known as:  SYNTHROID, LEVOTHROID Take 50 mcg by mouth daily before breakfast.   LORazepam 0.5 MG tablet Commonly known as:  ATIVAN Take 1 tablet by mouth daily as needed for anxiety.   meclizine 25 MG  tablet Commonly known as:  ANTIVERT Take 25 mg by mouth 3 (three) times daily as needed (inner ear problems).   methocarbamol 500 MG tablet Commonly known as:  ROBAXIN Take 1 tablet (500 mg total) by mouth every 6 (six) hours as needed for muscle spasms.   polyethylene glycol packet Commonly known as:  MIRALAX / GLYCOLAX Take 17 g by mouth daily.   potassium chloride SA 20 MEQ tablet Commonly known as:  K-DUR,KLOR-CON Take 0.5 tablets (10 mEq total) by mouth daily.   traMADol 50 MG  tablet Commonly known as:  ULTRAM Take 1 tablet (50 mg total) by mouth every 6 (six) hours as needed for moderate pain. What changed:  when to take this       Allergies  Allergen Reactions  . Codeine Other (See Comments)    Passes out  . Grapefruit Concentrate   . Other     Potatoes, green beans, corn  . Pineapple     Consultations:  Cardiology (phone)   Procedures/Studies: Ct Angio Head W Or Wo Contrast  Result Date: 01/23/2017 CLINICAL DATA:  Severe LEFT shoulder and neck pain while changing clothes tonight. History of hypertension, hyperlipidemia. EXAM: CT ANGIOGRAPHY HEAD AND NECK TECHNIQUE: Multidetector CT imaging of the head and neck was performed using the standard protocol during bolus administration of intravenous contrast. Multiplanar CT image reconstructions and MIPs were obtained to evaluate the vascular anatomy. Carotid stenosis measurements (when applicable) are obtained utilizing NASCET criteria, using the distal internal carotid diameter as the denominator. CONTRAST:  75 cc Isovue 370 COMPARISON:  CT HEAD August 18, 2016 FINDINGS: CT HEAD FINDINGS BRAIN: No intraparenchymal hemorrhage, mass effect nor midline shift. The ventricles and sulci are normal for age. Patchy supratentorial white matter hypodensities within normal range for patient's age, though non-specific are most compatible with chronic small vessel ischemic disease. No acute large vascular territory infarcts. No abnormal extra-axial fluid collections. Basal cisterns are patent. VASCULAR: Moderate calcific atherosclerosis of the carotid siphons. SKULL: No skull fracture. No significant scalp soft tissue swelling. SINUSES/ORBITS: The mastoid air-cells and included paranasal sinuses are well-aerated.The included ocular globes and orbital contents are non-suspicious. OTHER: None. CTA NECK AORTIC ARCH: Normal appearance of the thoracic arch, normal branch pattern. Moderate calcific atherosclerosis. Severe stenosis  RIGHT subclavian artery origin with tandem mild stenosis remaining RIGHT subclavian artery. Mild tandem stenosis LEFT subclavian artery. The origins of the innominate, left Common carotid artery and subclavian artery are widely patent. RIGHT CAROTID SYSTEM: Common carotid artery is widely patent. Normal appearance of the carotid bifurcation without hemodynamically significant stenosis by NASCET criteria, trace calcific atherosclerosis. Widely patent cervical internal carotid artery of mild luminal regularity and patulous appearance favoring fibromuscular dysplasia, less likely atherosclerosis. LEFT CAROTID SYSTEM: Common carotid artery is widely patent. Normal appearance of the carotid bifurcation without hemodynamically significant stenosis by NASCET criteria, moderate eccentric intimal thickening and calcific atherosclerosis. Normal appearance of the included internal carotid artery. VERTEBRAL ARTERIES:Left vertebral artery is dominant. Normal chronic appearing dissects and RIGHT P1 segment with moderate luminal irregularity RIGHT vertebral artery, no flow limiting stenosis. LEFT vertebral artery is widely patent. SKELETON: No acute osseous process though bone windows have not been submitted. Multilevel severe degenerative change of the cervical spine. Severe RIGHT C3-4, bilateral C4-5, C5-6, C6-7 neural foraminal narrowing. OTHER NECK: Soft tissues of the neck are non-acute though, not tailored for evaluation. Sub solid LEFT upper lobe 4 mm pulmonary nodule, no indicated follow-up. Minimal apical calcified pleural plaques. CTA HEAD ANTERIOR CIRCULATION:  Patent cervical internal carotid arteries, petrous, cavernous and supra clinoid internal carotid arteries. Widely patent anterior communicating artery. Patent anterior and middle cerebral arteries. Mild luminal irregularity compatible with atherosclerosis middle cerebral arteries. No large vessel occlusion, hemodynamically significant stenosis, dissection,  contrast extravasation or aneurysm. POSTERIOR CIRCULATION: Patent vertebral arteries, vertebrobasilar junction and basilar artery, as well as main branch vessels. Moderate tandem stenoses RIGHT intradural vertebral artery. Patent posterior cerebral arteries. Mild stenosis LEFT distal P1 segment. No large vessel occlusion, hemodynamically significant stenosis, dissection, contrast extravasation or aneurysm. VENOUS SINUSES: Major dural venous sinuses are patent though not tailored for evaluation on this angiographic examination. ANATOMIC VARIANTS: None. DELAYED PHASE: No abnormal intracranial enhancement. MIP images reviewed. IMPRESSION: CT HEAD: No acute intracranial process; negative noncontrast CT HEAD for age. CTA NECK: No hemodynamically significant stenosis or acute vascular process with particular attention to the LEFT vertebral artery. Old RIGHT vertebral artery non flow limiting dissection. Severe stenosis RIGHT subclavian artery origin. Severe C3-4 through C6-7 neural foraminal narrowing. CTA HEAD:  No emergent large vessel occlusion or severe stenosis. Moderate tandem stenoses RIGHT V4 segment compatible with atherosclerosis. Electronically Signed   By: Elon Alas M.D.   On: 01/23/2017 03:24   Dg Cervical Spine Complete  Result Date: 01/23/2017 CLINICAL DATA:  Left-sided neck pain EXAM: CERVICAL SPINE - COMPLETE 4+ VIEW COMPARISON:  06/04/2016 FINDINGS: Suboptimal visualization of the dens and lateral masses. The lung apices appear clear. Trace anterolisthesis of C2 on C3. Moderate severe degenerative changes at C4-C5, C5-C6 and C6-C7. Apparent widening of the atlantoaxial articulation measuring up to 7.6 mm with offset of the spinal laminar line. Prevertebral soft tissue thickness is normal. IMPRESSION: 1. Inadequate evaluation of the dens and lateral masses. 2. Widened appearance between the anterior arch of C1 and the tip of the dens, measuring up to 7.6 mm. If ligamentous injury is suspected,  MRI would be recommended for further evaluation. CT could be utilized to assess for fracture if there is suspicion for acute osseous injury. Electronically Signed   By: Donavan Foil M.D.   On: 01/23/2017 01:31   Ct Angio Neck W And/or Wo Contrast  Result Date: 01/23/2017 CLINICAL DATA:  Severe LEFT shoulder and neck pain while changing clothes tonight. History of hypertension, hyperlipidemia. EXAM: CT ANGIOGRAPHY HEAD AND NECK TECHNIQUE: Multidetector CT imaging of the head and neck was performed using the standard protocol during bolus administration of intravenous contrast. Multiplanar CT image reconstructions and MIPs were obtained to evaluate the vascular anatomy. Carotid stenosis measurements (when applicable) are obtained utilizing NASCET criteria, using the distal internal carotid diameter as the denominator. CONTRAST:  75 cc Isovue 370 COMPARISON:  CT HEAD August 18, 2016 FINDINGS: CT HEAD FINDINGS BRAIN: No intraparenchymal hemorrhage, mass effect nor midline shift. The ventricles and sulci are normal for age. Patchy supratentorial white matter hypodensities within normal range for patient's age, though non-specific are most compatible with chronic small vessel ischemic disease. No acute large vascular territory infarcts. No abnormal extra-axial fluid collections. Basal cisterns are patent. VASCULAR: Moderate calcific atherosclerosis of the carotid siphons. SKULL: No skull fracture. No significant scalp soft tissue swelling. SINUSES/ORBITS: The mastoid air-cells and included paranasal sinuses are well-aerated.The included ocular globes and orbital contents are non-suspicious. OTHER: None. CTA NECK AORTIC ARCH: Normal appearance of the thoracic arch, normal branch pattern. Moderate calcific atherosclerosis. Severe stenosis RIGHT subclavian artery origin with tandem mild stenosis remaining RIGHT subclavian artery. Mild tandem stenosis LEFT subclavian artery. The origins of the innominate, left Common  carotid artery and subclavian artery are widely patent. RIGHT CAROTID SYSTEM: Common carotid artery is widely patent. Normal appearance of the carotid bifurcation without hemodynamically significant stenosis by NASCET criteria, trace calcific atherosclerosis. Widely patent cervical internal carotid artery of mild luminal regularity and patulous appearance favoring fibromuscular dysplasia, less likely atherosclerosis. LEFT CAROTID SYSTEM: Common carotid artery is widely patent. Normal appearance of the carotid bifurcation without hemodynamically significant stenosis by NASCET criteria, moderate eccentric intimal thickening and calcific atherosclerosis. Normal appearance of the included internal carotid artery. VERTEBRAL ARTERIES:Left vertebral artery is dominant. Normal chronic appearing dissects and RIGHT P1 segment with moderate luminal irregularity RIGHT vertebral artery, no flow limiting stenosis. LEFT vertebral artery is widely patent. SKELETON: No acute osseous process though bone windows have not been submitted. Multilevel severe degenerative change of the cervical spine. Severe RIGHT C3-4, bilateral C4-5, C5-6, C6-7 neural foraminal narrowing. OTHER NECK: Soft tissues of the neck are non-acute though, not tailored for evaluation. Sub solid LEFT upper lobe 4 mm pulmonary nodule, no indicated follow-up. Minimal apical calcified pleural plaques. CTA HEAD ANTERIOR CIRCULATION: Patent cervical internal carotid arteries, petrous, cavernous and supra clinoid internal carotid arteries. Widely patent anterior communicating artery. Patent anterior and middle cerebral arteries. Mild luminal irregularity compatible with atherosclerosis middle cerebral arteries. No large vessel occlusion, hemodynamically significant stenosis, dissection, contrast extravasation or aneurysm. POSTERIOR CIRCULATION: Patent vertebral arteries, vertebrobasilar junction and basilar artery, as well as main branch vessels. Moderate tandem stenoses  RIGHT intradural vertebral artery. Patent posterior cerebral arteries. Mild stenosis LEFT distal P1 segment. No large vessel occlusion, hemodynamically significant stenosis, dissection, contrast extravasation or aneurysm. VENOUS SINUSES: Major dural venous sinuses are patent though not tailored for evaluation on this angiographic examination. ANATOMIC VARIANTS: None. DELAYED PHASE: No abnormal intracranial enhancement. MIP images reviewed. IMPRESSION: CT HEAD: No acute intracranial process; negative noncontrast CT HEAD for age. CTA NECK: No hemodynamically significant stenosis or acute vascular process with particular attention to the LEFT vertebral artery. Old RIGHT vertebral artery non flow limiting dissection. Severe stenosis RIGHT subclavian artery origin. Severe C3-4 through C6-7 neural foraminal narrowing. CTA HEAD:  No emergent large vessel occlusion or severe stenosis. Moderate tandem stenoses RIGHT V4 segment compatible with atherosclerosis. Electronically Signed   By: Elon Alas M.D.   On: 01/23/2017 03:24   Mr 3d Recon At Scanner  Result Date: 01/19/2017 CLINICAL DATA:  Pancreatic mass on CT. EXAM: MRI ABDOMEN WITHOUT AND WITH CONTRAST (INCLUDING MRCP) TECHNIQUE: Multiplanar multisequence MR imaging of the abdomen was performed both before and after the administration of intravenous contrast. Heavily T2-weighted images of the biliary and pancreatic ducts were obtained, and three-dimensional MRCP images were rendered by post processing. CONTRAST:  63mL MULTIHANCE GADOBENATE DIMEGLUMINE 529 MG/ML IV SOLN COMPARISON:  CTs of 01/09/2017 and 10/03/2013. FINDINGS: Moderate-to-marked motion degradation throughout. Lower chest: Moderate cardiomegaly, without pericardial or pleural effusion. Hepatobiliary: Grossly normal appearance the liver, without intrahepatic biliary duct dilatation. The gallbladder is distended at 10.9 cm on image 37/ series 101. No cause identified. No biliary duct dilatation.  Pancreas: Corresponding to the CT abnormality, arising from or intimately associated with the inferior aspect of pancreatic uncinate process is a mildly heterogeneous soft tissue mass which measures 4.0 x 3.0 cm on image 41/series 6. This corresponds to heterogeneous enhancement on image 47/series 24 and restricted diffusion on image 64/series 10. Compare 2.7 x 2.7 cm on image 172/series 5 of the 10/03/2013 study. This is immediately to the right of the superior mesenteric vein, which remains patent. No pancreatic  duct dilatation or acute pancreatitis. Spleen:  Normal in size, without focal abnormality. Adrenals/Urinary Tract: Normal adrenal glands. Expected renal cortical thinning for age. Bilateral renal cysts. A dominant upper pole right renal lesion measures 4.9 cm and demonstrates mild complexity within. No hydronephrosis. Stomach/Bowel: Normal stomach and abdominal bowel loops. Vascular/Lymphatic: Aortic and branch vessel atherosclerosis. Patent superior mesenteric artery and celiac axis. Patent portal vein. No retroperitoneal or retrocrural adenopathy. Increased signal in the small bowel mesenteric on image 28/series 19. Other:  No ascites. Musculoskeletal: S-shaped thoracolumbar spine curvature. IMPRESSION: 1. Motion degraded images. 2. Soft tissue mass which is favored to arise from the inferior aspect of the uncinate process of the pancreas. This is felt to be present on 10/03/2013, but significantly enlarged since that relatively remote exam. Therefore, an indolent neoplastic process is favored. This could represent an indolent pancreatic primary or lymphoma. A more aggressive lesion, such as pancreatic adenocarcinoma, is felt less likely. If this 83 year old patient is an endoscopy candidate, this could be sampled by endoscopic ultrasound. If not, CT follow-up at 2-3 months would be an alternate clinical strategy. 3. No typical findings of metastatic disease. 4. Gallbladder distention, without cause  identified. No acute cholecystitis. 5. 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 Electronically Signed   By: Abigail Miyamoto M.D.   On: 01/19/2017 12:29   Dg Shoulder Left  Result Date: 01/23/2017 CLINICAL DATA:  Sudden onset severe left shoulder pain while changing close tonight. EXAM: LEFT SHOULDER - 2+ VIEW COMPARISON:  None. FINDINGS: Negative for acute fracture or dislocation. There is chronic arthropathy at the shoulder, with superior subluxation of the humeral head to the acromial undersurface and with moderate subchondral sclerosis. This probably represents chronic rotator cuff arthropathy. No bone lesion or bony destruction. IMPRESSION: Chronic arthropathy at the shoulder.  No acute findings. Electronically Signed   By: Andreas Newport M.D.   On: 01/23/2017 01:26   Mr Abdomen Mrcp Moise Boring Contast  Result Date: 01/19/2017 CLINICAL DATA:  Pancreatic mass on CT. EXAM: MRI ABDOMEN WITHOUT AND WITH CONTRAST (INCLUDING MRCP) TECHNIQUE: Multiplanar multisequence MR imaging of the abdomen was performed both before and after the administration of intravenous contrast. Heavily T2-weighted images of the biliary and pancreatic ducts were obtained, and three-dimensional MRCP images were rendered by post processing. CONTRAST:  35mL MULTIHANCE GADOBENATE DIMEGLUMINE 529 MG/ML IV SOLN COMPARISON:  CTs of 01/09/2017 and 10/03/2013. FINDINGS: Moderate-to-marked motion degradation throughout. Lower chest: Moderate cardiomegaly, without pericardial or pleural effusion. Hepatobiliary: Grossly normal appearance the liver, without intrahepatic biliary duct dilatation. The gallbladder is distended at 10.9 cm on image 37/ series 101. No cause identified. No biliary duct dilatation. Pancreas: Corresponding to the CT abnormality, arising from or intimately associated with the inferior aspect of pancreatic uncinate process is a mildly  heterogeneous soft tissue mass which measures 4.0 x 3.0 cm on image 41/series 6. This corresponds to heterogeneous enhancement on image 47/series 24 and restricted diffusion on image 64/series 10. Compare 2.7 x 2.7 cm on image 172/series 5 of the 10/03/2013 study. This is immediately to the right of the superior mesenteric vein, which remains patent. No pancreatic duct dilatation or acute pancreatitis. Spleen:  Normal in size, without focal abnormality. Adrenals/Urinary Tract: Normal adrenal glands. Expected renal cortical thinning for age. Bilateral renal cysts. A dominant upper pole right renal lesion measures 4.9 cm and demonstrates mild complexity within. No hydronephrosis. Stomach/Bowel: Normal stomach and abdominal bowel loops. Vascular/Lymphatic: Aortic and branch  vessel atherosclerosis. Patent superior mesenteric artery and celiac axis. Patent portal vein. No retroperitoneal or retrocrural adenopathy. Increased signal in the small bowel mesenteric on image 28/series 19. Other:  No ascites. Musculoskeletal: S-shaped thoracolumbar spine curvature. IMPRESSION: 1. Motion degraded images. 2. Soft tissue mass which is favored to arise from the inferior aspect of the uncinate process of the pancreas. This is felt to be present on 10/03/2013, but significantly enlarged since that relatively remote exam. Therefore, an indolent neoplastic process is favored. This could represent an indolent pancreatic primary or lymphoma. A more aggressive lesion, such as pancreatic adenocarcinoma, is felt less likely. If this 83 year old patient is an endoscopy candidate, this could be sampled by endoscopic ultrasound. If not, CT follow-up at 2-3 months would be an alternate clinical strategy. 3. No typical findings of metastatic disease. 4. Gallbladder distention, without cause identified. No acute cholecystitis. 5. Small bowel mesenteric findings which are suspicious for mesenteric panniculitis, chronic and of indeterminate  clinical significance. Electronically Signed   By: Abigail Miyamoto M.D.   On: 01/19/2017 12:29   Ct Renal Stone Study  Result Date: 01/09/2017 CLINICAL DATA:  Patient with left flank pain. History of renal stones. EXAM: CT ABDOMEN AND PELVIS WITHOUT CONTRAST TECHNIQUE: Multidetector CT imaging of the abdomen and pelvis was performed following the standard protocol without IV contrast. COMPARISON:  CT abdomen pelvis 10/03/2013 FINDINGS: Lower chest: Normal heart size. Coronary arterial vascular calcifications. Subpleural ground-glass opacities within the lower lobes bilaterally, favored represent atelectasis. No pleural effusion. Hepatobiliary: Liver is diffusely low in attenuation. Gallbladder is prominent. No wall thickening or pericholecystic fluid. No intrahepatic or extrahepatic biliary ductal dilatation. Pancreas: There is a 3.8 x 4.3 cm irregular soft tissue mass within the region of the pancreatic head/ uncinate process (image 33; series 2). Spleen: Unremarkable Adrenals/Urinary Tract: The adrenal glands are normal. There is a 3.2 cm cyst within the superior pole of the right kidney. There is a 1.4 cm exophytic cyst within the interpolar region of the left kidney. No nephroureterolithiasis. No hydronephrosis. Small posterior left lateral diverticulum off the urinary bladder. Stomach/Bowel: Stool throughout the colon. No abnormal bowel wall thickening or evidence for bowel obstruction. Normal morphology of the stomach. Vascular/Lymphatic: Normal caliber abdominal aorta. Peripheral calcified atherosclerotic plaque. No retroperitoneal lymphadenopathy. Reproductive: Central dystrophic calcifications in the prostate. Postsurgical defect in the prostate. Other: Bilateral fat containing inguinal hernias. Musculoskeletal: Lumbar spine degenerative changes. No aggressive or acute appearing osseous lesions. IMPRESSION: Irregular masslike density within the region of the pancreatic head/ uncinate process concerning for  the possibility of a pancreatic neoplasm. This is incompletely evaluated without intravenous contrast material. Recommend dedicated evaluation of the pancreas with nonemergent pre and post contrast-enhanced abdominal MRI. No nephroureterolithiasis or hydronephrosis. Hepatic steatosis. Aortic atherosclerosis. These results were called by telephone at the time of interpretation on 01/09/2017 at 8:33 am to Dr. Brantley Stage , who verbally acknowledged these results. Electronically Signed   By: Lovey Newcomer M.D.   On: 01/09/2017 08:36      Subjective: No chest pain, dizziness, lightheadedness. Still has some neck/shoulder pain which improves when he turns his head to the right  Discharge Exam: Vitals:   01/23/17 0530 01/23/17 0823  BP: (!) 144/71 130/60  Pulse: (!) 46 (!) 35  Resp: 12 14  Temp:  97.6 F (36.4 C)   Vitals:   01/23/17 0500 01/23/17 0515 01/23/17 0530 01/23/17 0823  BP: (!) 143/69 139/65 (!) 144/71 130/60  Pulse: (!) 45 (!) 48 (!) 46 Marland Kitchen)  35  Resp: 14 15 12 14   Temp:    97.6 F (36.4 C)  TempSrc:    Oral  SpO2: 95% 97% 97% 98%  Weight:    82.6 kg (182 lb)  Height:    5\' 11"  (1.803 m)    General: Pt is alert, awake, not in acute distress Cardiovascular: Irregular, bradycardic S1/S2 +, no rubs, no gallops Respiratory: CTA bilaterally, no wheezing, no rhonchi Abdominal: Soft, NT, ND, bowel sounds + Extremities: no edema, no cyanosis    The results of significant diagnostics from this hospitalization (including imaging, microbiology, ancillary and laboratory) are listed below for reference.     Microbiology: No results found for this or any previous visit (from the past 240 hour(s)).   Labs: BNP (last 3 results) No results for input(s): BNP in the last 8760 hours. Basic Metabolic Panel:  Recent Labs Lab 01/23/17 0019  NA 139  K 3.7  CL 107  CO2 25  GLUCOSE 123*  BUN 39*  CREATININE 1.34*  CALCIUM 8.9  MG 1.9   Liver Function Tests: No results for input(s):  AST, ALT, ALKPHOS, BILITOT, PROT, ALBUMIN in the last 168 hours. No results for input(s): LIPASE, AMYLASE in the last 168 hours. No results for input(s): AMMONIA in the last 168 hours. CBC:  Recent Labs Lab 01/23/17 0019  WBC 5.4  NEUTROABS 3.0  HGB 11.1*  HCT 33.4*  MCV 93.6  PLT 267   Cardiac Enzymes:  Recent Labs Lab 01/23/17 0019 01/23/17 0318 01/23/17 0904  TROPONINI <0.03 <0.03 <0.03   BNP: Invalid input(s): POCBNP CBG: No results for input(s): GLUCAP in the last 168 hours. D-Dimer No results for input(s): DDIMER in the last 72 hours. Hgb A1c No results for input(s): HGBA1C in the last 72 hours. Lipid Profile No results for input(s): CHOL, HDL, LDLCALC, TRIG, CHOLHDL, LDLDIRECT in the last 72 hours. Thyroid function studies  Recent Labs  01/23/17 0019  TSH 0.528   Anemia work up No results for input(s): VITAMINB12, FOLATE, FERRITIN, TIBC, IRON, RETICCTPCT in the last 72 hours. Urinalysis    Component Value Date/Time   COLORURINE STRAW (A) 01/09/2017 0820   APPEARANCEUR CLEAR 01/09/2017 0820   LABSPEC 1.008 01/09/2017 0820   PHURINE 7.0 01/09/2017 0820   GLUCOSEU NEGATIVE 01/09/2017 0820   HGBUR NEGATIVE 01/09/2017 0820   BILIRUBINUR NEGATIVE 01/09/2017 0820   KETONESUR NEGATIVE 01/09/2017 0820   PROTEINUR NEGATIVE 01/09/2017 0820   UROBILINOGEN 0.2 10/02/2013 2114   NITRITE NEGATIVE 01/09/2017 0820   LEUKOCYTESUR NEGATIVE 01/09/2017 0820   Sepsis Labs Invalid input(s): PROCALCITONIN,  WBC,  LACTICIDVEN Microbiology No results found for this or any previous visit (from the past 240 hour(s)).   Time coordinating discharge: Over 30 minutes  SIGNED:   Kathie Dike, MD  [facility] 01/23/2017, 12:30 PM Pager   If 7PM-7AM, please contact night-coverage [email or web address] Password [email or web address]

## 2017-01-23 NOTE — H&P (Signed)
TRH H&P    Patient Demographics:    Mario May, is a 83 y.o. male  MRN: [id number]  DOB - 1915-05-29  Admit Date - 01/22/2017  Referring MD/NP/PA: Dr Wyvonnia Dusky  Outpatient Primary MD for the patient is Dione Housekeeper, MD  Patient coming from: Home  Chief Complaint  Patient presents with  . Shoulder Pain      HPI:    Mario May  is a 83 y.o. male, With history of hypertension, was brought to ED by EMS for  Worsening of chronic left shoulder pain which has been there for months. Pain radiates up his neck and down his left arm. No associated symptoms of chest pain or shortness of breath. No history of falls. He denies headache. Denies dizziness. He denies fever or dysuria.  In the ED patient underwent CTA of neck as well as CT cervical spine. CTA showed chronic right vertebral dissection, ED physician discussed with neurologist Dr. Cheral Marker who recommended to continue aspirin. CT cervical spine showed arthritic changes. Patient also found to have bradycardia with heart rate drop into 30s and 40s when he was asleep, though he is totally asymptomatic. As per family he had similar episodes in November last year when he was admitted at that time echo was done and patient was discharged home. He hasn't had any syncopal episode or dizziness at home. EKG showed new atrial fibrillation, heart rate is controlled as above.   Review of systems:     A full 10 point Review of Systems was done, except as stated above, all other Review of Systems were negative.   With Past History of the following :    Past Medical History:  Diagnosis Date  . Anemia   . Arthritis    "just my legs" (04/06/2013)  . Assistance needed for mobility    walks with walker  . Constipation   . Gout   . HOH (hard of hearing)   . Hyperlipidemia   . Hypertension   . Hypothyroidism   . Kidney stones    "only once" (04/06/2013)  .  Lumbar foraminal stenosis 06/04/2016  . 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 Rheumatism   . SIRS (systemic inflammatory response syndrome) ([place]) 02/2013   [name] 03/19/2013  (04/06/2013)  . Small bowel obstruction [facility])       Past Surgical History:  Procedure Laterality Date  . CYSTOSCOPY W/ STONE MANIPULATION  ?1986  . NASAL FRACTURE SURGERY    . TRANSURETHRAL RESECTION OF PROSTATE        Social History:      Social History  Substance Use Topics  . Smoking status: Former Smoker    Packs/day: 0.50    Types: Cigarettes  . Smokeless tobacco: Never Used     Comment: 04/06/2013 "quit smoking ~ 45-50 yr ago"  . Alcohol use No     Comment: 04/06/2013 "last time I've had any alcohol was 09/12/1945; never had a problem w/it"       Family History :     Family History  Problem Relation  Age of Onset  . Colon cancer Mother   . Prostate cancer Father   . Heart disease Father   . Colon polyps Brother     x 2  . Heart disease Brother       Home Medications:   Prior to Admission medications   Medication Sig Start Date End Date Taking? Authorizing Provider  allopurinol (ZYLOPRIM) 100 MG tablet Take 100 mg by mouth daily.    [provider]  amLODipine (NORVASC) 10 MG tablet Take 1 tablet (10 mg total) by mouth daily. 04/07/13   Ghimire, Henreitta Leber, MD  aspirin 325 MG tablet Take 325 mg by mouth daily.    [provider]  bisacodyl (DULCOLAX) 5 MG EC tablet Take 1 tablet (5 mg total) by mouth daily as needed for moderate constipation. 01/22/15   Gatha Mayer, MD  finasteride (PROSCAR) 5 MG tablet Take 5 mg by mouth.    [provider]  furosemide (LASIX) 20 MG tablet Take 1 tablet by mouth daily. 05/19/16   [provider]  isosorbide dinitrate (ISORDIL) 10 MG tablet Take 10 mg by mouth 2 (two) times daily.    [provider]  levothyroxine (SYNTHROID, LEVOTHROID) 50 MCG tablet Take 50 mcg by mouth daily before breakfast.     [provider]  LORazepam (ATIVAN) 0.5 MG tablet Take 1 tablet by mouth daily as needed for anxiety. 04/22/16 04/22/17  [provider]  meclizine (ANTIVERT) 25 MG tablet Take 25 mg by mouth 3 (three) times daily as needed (inner ear problems).    [provider]  polyethylene glycol (MIRALAX / GLYCOLAX) packet Take 17 g by mouth daily.    [provider]  potassium chloride SA (K-DUR,KLOR-CON) 20 MEQ tablet Take 0.5 tablets (10 mEq total) by mouth daily. 06/07/16   Rexene Alberts, MD  traMADol (ULTRAM) 50 MG tablet Take 50 mg by mouth 2 (two) times daily as needed for moderate pain.     [provider]     Allergies:     Allergies  Allergen Reactions  . Codeine Other (See Comments)    Passes out  . Grapefruit Concentrate   . Other     Potatoes, green beans, corn  . Pineapple      Physical Exam:   Vitals  Blood pressure (!) 144/71, pulse (!) 46, temperature 97.8 F (36.6 C), temperature source Oral, resp. rate 12, height 5\' 11"  (1.803 m), weight 82.6 kg (182 lb), SpO2 97 %.  1.  General: Appears in no acute distress  2. Psychiatric:  Intact judgement and  insight, awake alert, oriented x 3.  3. Neurologic: No focal neurological deficits, all cranial nerves intact.Strength 5/5 all 4 extremities, sensation intact all 4 extremities, plantars down going.  4. Eyes :  anicteric sclerae, moist conjunctivae with no lid lag. PERRLA.  5. ENMT:  Oropharynx clear with moist mucous membranes and good dentition  6. Neck:  supple, no cervical lymphadenopathy appriciated, No thyromegaly  7. Respiratory : Normal respiratory effort, good air movement bilaterally,clear to  auscultation bilaterally  8. Cardiovascular : RRR, no gallops, rubs or murmurs, no leg edema  9. Gastrointestinal:  Positive bowel sounds, abdomen soft, non-tender to palpation,no hepatosplenomegaly, no rigidity or guarding       10. Skin:  No cyanosis, normal texture  and turgor, no rash, lesions or ulcers  11.Musculoskeletal:  Good muscle tone,  joints appear normal , no effusions,  normal range of motion    Data  Review:    CBC  Recent Labs Lab 01/23/17 0019  WBC 5.4  HGB 11.1*  HCT 33.4*  PLT 267  MCV 93.6  MCH 31.1  MCHC 33.2  RDW 13.8  LYMPHSABS 1.5  MONOABS 0.5  EOSABS 0.4  BASOSABS 0.0   ------------------------------------------------------------------------------------------------------------------  Chemistries   Recent Labs Lab 01/23/17 0019  NA 139  K 3.7  CL 107  CO2 25  GLUCOSE 123*  BUN 39*  CREATININE 1.34*  CALCIUM 8.9  MG 1.9   ------------------------------------------------------------------------------------------------------------------  ------------------------------------------------------------------------------------------------------------------ GFR: Estimated Creatinine Clearance: 32 mL/min (A) (by C-G formula based on SCr of 1.34 mg/dL (H)). Liver Function Tests: No results for input(s): AST, ALT, ALKPHOS, BILITOT, PROT, ALBUMIN in the last 168 hours. No results for input(s): LIPASE, AMYLASE in the last 168 hours. No results for input(s): AMMONIA in the last 168 hours. Coagulation Profile: No results for input(s): INR, PROTIME in the last 168 hours. Cardiac Enzymes:  Recent Labs Lab 01/23/17 0019 01/23/17 0318  TROPONINI <0.03 <0.03   BNP (last 3 results) No results for input(s): PROBNP in the last 8760 hours. HbA1C: No results for input(s): HGBA1C in the last 72 hours. CBG: No results for input(s): GLUCAP in the last 168 hours. Lipid Profile: No results for input(s): CHOL, HDL, LDLCALC, TRIG, CHOLHDL, LDLDIRECT in the last 72 hours. Thyroid Function Tests: No results for input(s): TSH, T4TOTAL, FREET4, T3FREE, THYROIDAB in the last 72 hours. Anemia Panel: No results for input(s): VITAMINB12, FOLATE, FERRITIN, TIBC, IRON, RETICCTPCT in the last 72  hours.  --------------------------------------------------------------------------------------------------------------- Urine analysis:    Component Value Date/Time   COLORURINE STRAW (A) 01/09/2017 0820   APPEARANCEUR CLEAR 01/09/2017 0820   LABSPEC 1.008 01/09/2017 0820   PHURINE 7.0 01/09/2017 0820   GLUCOSEU NEGATIVE 01/09/2017 0820   HGBUR NEGATIVE 01/09/2017 0820   BILIRUBINUR NEGATIVE 01/09/2017 0820   KETONESUR NEGATIVE 01/09/2017 0820   PROTEINUR NEGATIVE 01/09/2017 0820   UROBILINOGEN 0.2 10/02/2013 2114   NITRITE NEGATIVE 01/09/2017 0820   LEUKOCYTESUR NEGATIVE 01/09/2017 0820      Imaging Results:    Ct Angio Head W Or Wo Contrast  Result Date: 01/23/2017 CLINICAL DATA:  Severe LEFT shoulder and neck pain while changing clothes tonight. History of hypertension, hyperlipidemia. EXAM: CT ANGIOGRAPHY HEAD AND NECK TECHNIQUE: Multidetector CT imaging of the head and neck was performed using the standard protocol during bolus administration of intravenous contrast. Multiplanar CT image reconstructions and MIPs were obtained to evaluate the vascular anatomy. Carotid stenosis measurements (when applicable) are obtained utilizing NASCET criteria, using the distal internal carotid diameter as the denominator. CONTRAST:  75 cc Isovue 370 COMPARISON:  CT HEAD August 18, 2016 FINDINGS: CT HEAD FINDINGS BRAIN: No intraparenchymal hemorrhage, mass effect nor midline shift. The ventricles and sulci are normal for age. Patchy supratentorial white matter hypodensities within normal range for patient's age, though non-specific are most compatible with chronic small vessel ischemic disease. No acute large vascular territory infarcts. No abnormal extra-axial fluid collections. Basal cisterns are patent. VASCULAR: Moderate calcific atherosclerosis of the carotid siphons. SKULL: No skull fracture. No significant scalp soft tissue swelling. SINUSES/ORBITS: The mastoid air-cells and included paranasal  sinuses are well-aerated.The included ocular globes and orbital contents are non-suspicious. OTHER: None. CTA NECK AORTIC ARCH: Normal appearance of the thoracic arch, normal branch pattern. Moderate calcific atherosclerosis. Severe stenosis RIGHT subclavian artery origin with tandem mild stenosis remaining RIGHT subclavian artery. Mild tandem stenosis LEFT subclavian artery. The origins of the innominate, left Common carotid artery and  subclavian artery are widely patent. RIGHT CAROTID SYSTEM: Common carotid artery is widely patent. Normal appearance of the carotid bifurcation without hemodynamically significant stenosis by NASCET criteria, trace calcific atherosclerosis. Widely patent cervical internal carotid artery of mild luminal regularity and patulous appearance favoring fibromuscular dysplasia, less likely atherosclerosis. LEFT CAROTID SYSTEM: Common carotid artery is widely patent. Normal appearance of the carotid bifurcation without hemodynamically significant stenosis by NASCET criteria, moderate eccentric intimal thickening and calcific atherosclerosis. Normal appearance of the included internal carotid artery. VERTEBRAL ARTERIES:Left vertebral artery is dominant. Normal chronic appearing dissects and RIGHT P1 segment with moderate luminal irregularity RIGHT vertebral artery, no flow limiting stenosis. LEFT vertebral artery is widely patent. SKELETON: No acute osseous process though bone windows have not been submitted. Multilevel severe degenerative change of the cervical spine. Severe RIGHT C3-4, bilateral C4-5, C5-6, C6-7 neural foraminal narrowing. OTHER NECK: Soft tissues of the neck are non-acute though, not tailored for evaluation. Sub solid LEFT upper lobe 4 mm pulmonary nodule, no indicated follow-up. Minimal apical calcified pleural plaques. CTA HEAD ANTERIOR CIRCULATION: Patent cervical internal carotid arteries, petrous, cavernous and supra clinoid internal carotid arteries. Widely patent  anterior communicating artery. Patent anterior and middle cerebral arteries. Mild luminal irregularity compatible with atherosclerosis middle cerebral arteries. No large vessel occlusion, hemodynamically significant stenosis, dissection, contrast extravasation or aneurysm. POSTERIOR CIRCULATION: Patent vertebral arteries, vertebrobasilar junction and basilar artery, as well as main branch vessels. Moderate tandem stenoses RIGHT intradural vertebral artery. Patent posterior cerebral arteries. Mild stenosis LEFT distal P1 segment. No large vessel occlusion, hemodynamically significant stenosis, dissection, contrast extravasation or aneurysm. VENOUS SINUSES: Major dural venous sinuses are patent though not tailored for evaluation on this angiographic examination. ANATOMIC VARIANTS: None. DELAYED PHASE: No abnormal intracranial enhancement. MIP images reviewed. IMPRESSION: CT HEAD: No acute intracranial process; negative noncontrast CT HEAD for age. CTA NECK: No hemodynamically significant stenosis or acute vascular process with particular attention to the LEFT vertebral artery. Old RIGHT vertebral artery non flow limiting dissection. Severe stenosis RIGHT subclavian artery origin. Severe C3-4 through C6-7 neural foraminal narrowing. CTA HEAD:  No emergent large vessel occlusion or severe stenosis. Moderate tandem stenoses RIGHT V4 segment compatible with atherosclerosis. Electronically Signed   By: Elon Alas M.D.   On: 01/23/2017 03:24   Dg Cervical Spine Complete  Result Date: 01/23/2017 CLINICAL DATA:  Left-sided neck pain EXAM: CERVICAL SPINE - COMPLETE 4+ VIEW COMPARISON:  06/04/2016 FINDINGS: Suboptimal visualization of the dens and lateral masses. The lung apices appear clear. Trace anterolisthesis of C2 on C3. Moderate severe degenerative changes at C4-C5, C5-C6 and C6-C7. Apparent widening of the atlantoaxial articulation measuring up to 7.6 mm with offset of the spinal laminar line. Prevertebral  soft tissue thickness is normal. IMPRESSION: 1. Inadequate evaluation of the dens and lateral masses. 2. Widened appearance between the anterior arch of C1 and the tip of the dens, measuring up to 7.6 mm. If ligamentous injury is suspected, MRI would be recommended for further evaluation. CT could be utilized to assess for fracture if there is suspicion for acute osseous injury. Electronically Signed   By: Donavan Foil M.D.   On: 01/23/2017 01:31   Ct Angio Neck W And/or Wo Contrast  Result Date: 01/23/2017 CLINICAL DATA:  Severe LEFT shoulder and neck pain while changing clothes tonight. History of hypertension, hyperlipidemia. EXAM: CT ANGIOGRAPHY HEAD AND NECK TECHNIQUE: Multidetector CT imaging of the head and neck was performed using the standard protocol during bolus administration of intravenous contrast. Multiplanar CT image  reconstructions and MIPs were obtained to evaluate the vascular anatomy. Carotid stenosis measurements (when applicable) are obtained utilizing NASCET criteria, using the distal internal carotid diameter as the denominator. CONTRAST:  75 cc Isovue 370 COMPARISON:  CT HEAD August 18, 2016 FINDINGS: CT HEAD FINDINGS BRAIN: No intraparenchymal hemorrhage, mass effect nor midline shift. The ventricles and sulci are normal for age. Patchy supratentorial white matter hypodensities within normal range for patient's age, though non-specific are most compatible with chronic small vessel ischemic disease. No acute large vascular territory infarcts. No abnormal extra-axial fluid collections. Basal cisterns are patent. VASCULAR: Moderate calcific atherosclerosis of the carotid siphons. SKULL: No skull fracture. No significant scalp soft tissue swelling. SINUSES/ORBITS: The mastoid air-cells and included paranasal sinuses are well-aerated.The included ocular globes and orbital contents are non-suspicious. OTHER: None. CTA NECK AORTIC ARCH: Normal appearance of the thoracic arch, normal branch  pattern. Moderate calcific atherosclerosis. Severe stenosis RIGHT subclavian artery origin with tandem mild stenosis remaining RIGHT subclavian artery. Mild tandem stenosis LEFT subclavian artery. The origins of the innominate, left Common carotid artery and subclavian artery are widely patent. RIGHT CAROTID SYSTEM: Common carotid artery is widely patent. Normal appearance of the carotid bifurcation without hemodynamically significant stenosis by NASCET criteria, trace calcific atherosclerosis. Widely patent cervical internal carotid artery of mild luminal regularity and patulous appearance favoring fibromuscular dysplasia, less likely atherosclerosis. LEFT CAROTID SYSTEM: Common carotid artery is widely patent. Normal appearance of the carotid bifurcation without hemodynamically significant stenosis by NASCET criteria, moderate eccentric intimal thickening and calcific atherosclerosis. Normal appearance of the included internal carotid artery. VERTEBRAL ARTERIES:Left vertebral artery is dominant. Normal chronic appearing dissects and RIGHT P1 segment with moderate luminal irregularity RIGHT vertebral artery, no flow limiting stenosis. LEFT vertebral artery is widely patent. SKELETON: No acute osseous process though bone windows have not been submitted. Multilevel severe degenerative change of the cervical spine. Severe RIGHT C3-4, bilateral C4-5, C5-6, C6-7 neural foraminal narrowing. OTHER NECK: Soft tissues of the neck are non-acute though, not tailored for evaluation. Sub solid LEFT upper lobe 4 mm pulmonary nodule, no indicated follow-up. Minimal apical calcified pleural plaques. CTA HEAD ANTERIOR CIRCULATION: Patent cervical internal carotid arteries, petrous, cavernous and supra clinoid internal carotid arteries. Widely patent anterior communicating artery. Patent anterior and middle cerebral arteries. Mild luminal irregularity compatible with atherosclerosis middle cerebral arteries. No large vessel  occlusion, hemodynamically significant stenosis, dissection, contrast extravasation or aneurysm. POSTERIOR CIRCULATION: Patent vertebral arteries, vertebrobasilar junction and basilar artery, as well as main branch vessels. Moderate tandem stenoses RIGHT intradural vertebral artery. Patent posterior cerebral arteries. Mild stenosis LEFT distal P1 segment. No large vessel occlusion, hemodynamically significant stenosis, dissection, contrast extravasation or aneurysm. VENOUS SINUSES: Major dural venous sinuses are patent though not tailored for evaluation on this angiographic examination. ANATOMIC VARIANTS: None. DELAYED PHASE: No abnormal intracranial enhancement. MIP images reviewed. IMPRESSION: CT HEAD: No acute intracranial process; negative noncontrast CT HEAD for age. CTA NECK: No hemodynamically significant stenosis or acute vascular process with particular attention to the LEFT vertebral artery. Old RIGHT vertebral artery non flow limiting dissection. Severe stenosis RIGHT subclavian artery origin. Severe C3-4 through C6-7 neural foraminal narrowing. CTA HEAD:  No emergent large vessel occlusion or severe stenosis. Moderate tandem stenoses RIGHT V4 segment compatible with atherosclerosis. Electronically Signed   By: Elon Alas M.D.   On: 01/23/2017 03:24   Dg Shoulder Left  Result Date: 01/23/2017 CLINICAL DATA:  Sudden onset severe left shoulder pain while changing close tonight. EXAM: LEFT SHOULDER - 2+  VIEW COMPARISON:  None. FINDINGS: Negative for acute fracture or dislocation. There is chronic arthropathy at the shoulder, with superior subluxation of the humeral head to the acromial undersurface and with moderate subchondral sclerosis. This probably represents chronic rotator cuff arthropathy. No bone lesion or bony destruction. IMPRESSION: Chronic arthropathy at the shoulder.  No acute findings. Electronically Signed   By: Andreas Newport M.D.   On: 01/23/2017 01:26    My personal review  of EKG: Rhythm - atrial fibrillation   Assessment & Plan:    Active Problems:   Hypothyroidism, adult   DJD (degenerative joint disease), multiple sites   Bradycardia   1. Chronic neck pain- likely from degenerative disc disease, continue tramadol 50 mg every 6 hours when necessary for pain. 2. Chronic right vertebral dissection- seen on CTA of neck, Dr Wyvonnia Dusky discussed with neurology, Dr. Cheral Marker. He recommends to continue aspirin this time. 3. New onset atrial fibrillation- patient has new onset A. fib, [id number]  Score is 3, heart rate is controlled, discussed the option for anticoagulation. Family does not want to put on anticoagulation due to advanced age and risk for falls. Continue aspirin 4. Bradycardia- patient had episodes of bradycardia with heart rate dropping to 30s and 40s but remained completely asymptomatic in the ED. He had similar episodes in November last year. Will check TSH. Will monitor the patient on telemetry 5. Left arm pain-likely radiation from the neck pain. we'll cycle cardiac enzymes every 6 hours 3. Continue tramadol 50 mg by mouth every 6 hours when necessary.   DVT Prophylaxis-   Lovenox   AM Labs Ordered, also please review Full Orders  Family Communication: Admission, patients condition and plan of care including tests being ordered have been discussed with the patient and His daughter at bedside who indicate understanding and agree with the plan and Code Status.  Code Status:  Limited code, DO NOT INTUBATE  Admission status: Observation  Time spent in minutes : 60 minutes   Everlene Cunning S M.D on 01/23/2017 at 6:08 AM  Between 7am to 7pm - Pager - [phone]. After 7pm go to [email or web address] - password [facility]  [facility] - Office  [phone]

## 2017-01-23 NOTE — Progress Notes (Signed)
Patient discharged home.  IV removed - WNL.  Reviewed Dc instructions and medications with daughter - verbalizes understanding,  Instructed to follow up with PCP in 1-2 weeks.  No questions at this time, assisted off unit in NAD.

## 2017-01-25 ENCOUNTER — Telehealth: Payer: Self-pay | Admitting: *Deleted

## 2017-01-25 NOTE — Telephone Encounter (Signed)
Spoke with Mrs. Alroy Dust regarding EP appointment needed for Mr. Nohr.  Scheduled for 02/01/17 @ 1:45 pm--arrive at 1:30 pm for check in.  Gave Mrs. Ayars the address and phone number of the office.

## 2017-01-26 ENCOUNTER — Other Ambulatory Visit: Payer: Self-pay

## 2017-01-26 DIAGNOSIS — I4891 Unspecified atrial fibrillation: Secondary | ICD-10-CM

## 2017-01-31 ENCOUNTER — Inpatient Hospital Stay (HOSPITAL_COMMUNITY): Admission: RE | Admit: 2017-01-31 | Payer: Medicare Other | Source: Ambulatory Visit

## 2017-02-01 ENCOUNTER — Other Ambulatory Visit: Payer: Self-pay

## 2017-02-01 ENCOUNTER — Ambulatory Visit (INDEPENDENT_AMBULATORY_CARE_PROVIDER_SITE_OTHER): Payer: Medicare Other | Admitting: Internal Medicine

## 2017-02-01 ENCOUNTER — Encounter: Payer: Self-pay | Admitting: Internal Medicine

## 2017-02-01 VITALS — BP 100/46 | HR 58 | Ht 71.0 in | Wt 176.2 lb

## 2017-02-01 DIAGNOSIS — I481 Persistent atrial fibrillation: Secondary | ICD-10-CM

## 2017-02-01 DIAGNOSIS — I4819 Other persistent atrial fibrillation: Secondary | ICD-10-CM

## 2017-02-01 NOTE — Patient Instructions (Signed)
Your physician recommends that you continue on your current medications as directed. Please refer to the Current Medication list given to you today. Your physician recommends that you schedule a follow-up appointment in: as needed with Dr. Lovena Le.

## 2017-02-01 NOTE — Progress Notes (Signed)
HPI [name] is referred today by Dr. Oval Linsey for evaluation of bradycardia. The patient has been diagnosed with atrial fib and was noted in the ED to have HR's in the 40's and 50's. He has not had syncope. He does fall but does not pass out. He was newly diagnosed with atrial fib and is not thought to be a candidate for coumadin or an [place]. He has class 2 dyspnea. His main complaint is pain in the neck which is not related to exertion.  Allergies  Allergen Reactions  . Codeine Other (See Comments)    Passes out  . Grapefruit Concentrate     unknown  . Other     Potatoes, green beans, corn - unknown  . Pineapple     unknown     Current Outpatient Prescriptions  Medication Sig Dispense Refill  . allopurinol (ZYLOPRIM) 100 MG tablet Take 100 mg by mouth daily.    Marland Kitchen amLODipine (NORVASC) 5 MG tablet Take 5 mg by mouth daily.    Marland Kitchen aspirin 325 MG tablet Take 325 mg by mouth daily.    . finasteride (PROSCAR) 5 MG tablet Take 5 mg by mouth daily.     . furosemide (LASIX) 20 MG tablet Take 1 tablet by mouth daily.    . isosorbide dinitrate (ISORDIL) 10 MG tablet Take 10 mg by mouth 2 (two) times daily.    Marland Kitchen levothyroxine (SYNTHROID, LEVOTHROID) 50 MCG tablet Take 50 mcg by mouth daily before breakfast.    . LORazepam (ATIVAN) 0.5 MG tablet Take 1 tablet by mouth daily as needed for anxiety.    . meclizine (ANTIVERT) 25 MG tablet Take 25 mg by mouth 3 (three) times daily as needed (inner ear problems).    . methocarbamol (ROBAXIN) 500 MG tablet Take 1 tablet (500 mg total) by mouth every 6 (six) hours as needed for muscle spasms. 30 tablet 0  . polyethylene glycol (MIRALAX / GLYCOLAX) packet Take 17 g by mouth daily.    . potassium chloride SA (K-DUR,KLOR-CON) 20 MEQ tablet Take 0.5 tablets (10 mEq total) by mouth daily. 15 tablet 3  . traMADol (ULTRAM) 50 MG tablet Take 1 tablet (50 mg total) by mouth every 6 (six) hours as needed for moderate pain. 30 tablet 1   No current  facility-administered medications for this visit.      Past Medical History:  Diagnosis Date  . Anemia   . Arthritis    "just my legs" (04/06/2013)  . Assistance needed for mobility    walks with walker  . Constipation   . Gout   . HOH (hard of hearing)   . Hyperlipidemia   . Hypertension   . Hypothyroidism   . Kidney stones    "only once" (04/06/2013)  . Lumbar foraminal stenosis 06/04/2016  . 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 Rheumatism   . SIRS (systemic inflammatory response syndrome) ([place]) 02/2013   [name] 03/19/2013  (04/06/2013)  . Small bowel obstruction (HCC)     ROS:   All systems reviewed and negative except as noted in the HPI.   Past Surgical History:  Procedure Laterality Date  . CYSTOSCOPY W/ STONE MANIPULATION  ?1986  . NASAL FRACTURE SURGERY    . TRANSURETHRAL RESECTION OF PROSTATE       Family History  Problem Relation Age of Onset  . Colon cancer Mother   . Prostate cancer Father   . Heart disease Father   . Colon polyps  Brother        x 2  . Heart disease Brother      Social History   Social History  . Marital status: Married    Spouse name: N/A  . Number of children: 2  . Years of education: N/A   Occupational History  . Not on file.   Social History Main Topics  . Smoking status: Former Smoker    Packs/day: 0.50    Types: Cigarettes  . Smokeless tobacco: Never Used     Comment: 04/06/2013 "quit smoking ~ 45-50 yr ago"  . Alcohol use No     Comment: 04/06/2013 "last time I've had any alcohol was 09/12/1945; never had a problem w/it"  . Drug use: No  . Sexual activity: Not on file   Other Topics Concern  . Not on file   Social History Narrative  . No narrative on file     BP (!) 100/46   Pulse (!) 58   Ht 5\' 11"  (1.803 m)   Wt 176 lb 3.2 oz (79.9 kg)   SpO2 97%   BMI 24.57 kg/m   Physical Exam:  Well appearing elderly man, NAD HEENT: Unremarkable Neck:  6 cm JVD, no thyromegally Lymphatics:   No adenopathy Back:  No CVA tenderness Lungs:  Clear with no wheezes HEART:  IRegular rate rhythm, no murmurs, no rubs, no clicks Abd:  soft, positive bowel sounds, no organomegally, no rebound, no guarding Ext:  2 plus pulses, no edema, no cyanosis, no clubbing Skin:  No rashes no nodules Neuro:  CN II through XII intact, motor grossly intact  EKG - reviewed atrial fib with a slow VR   Assess/Plan: 1. Asymptomatic bradycardia - he has HR's in the 40's and 50's but is fairly sedentary and asymptomatic. I would recommend watchful waiting 2. Persistent atrial fib - he is asymptomatic. His rates are controlled. He is not a candidate for systemic anti-coag due to falls 3. HTN - his blood pressure is controlled. If it gets much lower, consider reducing his meds. 4. Neck pain - this is not due to angina. We discussed continuing the tramadol and robaxin. He will followup with his primary MD.  Mikle Bosworth.D.

## 2017-02-01 NOTE — Progress Notes (Signed)
Patient had appointment today with Dr Lovena Le

## 2017-08-21 ENCOUNTER — Emergency Department (HOSPITAL_COMMUNITY): Payer: Medicare Other

## 2017-08-21 ENCOUNTER — Other Ambulatory Visit: Payer: Self-pay

## 2017-08-21 ENCOUNTER — Inpatient Hospital Stay (HOSPITAL_COMMUNITY)
Admission: EM | Admit: 2017-08-21 | Discharge: 2017-08-23 | DRG: 195 | Disposition: A | Discharge status: Home / Self Care | Payer: Medicare Other | Attending: Internal Medicine | Admitting: Internal Medicine

## 2017-08-21 ENCOUNTER — Encounter (HOSPITAL_COMMUNITY): Payer: Self-pay

## 2017-08-21 DIAGNOSIS — Z885 Allergy status to narcotic agent status: Secondary | ICD-10-CM | POA: Diagnosis not present

## 2017-08-21 DIAGNOSIS — E039 Hypothyroidism, unspecified: Secondary | ICD-10-CM | POA: Diagnosis present

## 2017-08-21 DIAGNOSIS — W182XXA Fall in (into) shower or empty bathtub, initial encounter: Secondary | ICD-10-CM | POA: Diagnosis present

## 2017-08-21 DIAGNOSIS — M790 Rheumatism, unspecified: Secondary | ICD-10-CM | POA: Diagnosis present

## 2017-08-21 DIAGNOSIS — M109 Gout, unspecified: Secondary | ICD-10-CM | POA: Diagnosis present

## 2017-08-21 DIAGNOSIS — I481 Persistent atrial fibrillation: Secondary | ICD-10-CM

## 2017-08-21 DIAGNOSIS — S161XXA Strain of muscle, fascia and tendon at neck level, initial encounter: Secondary | ICD-10-CM | POA: Diagnosis present

## 2017-08-21 DIAGNOSIS — M48061 Spinal stenosis, lumbar region without neurogenic claudication: Secondary | ICD-10-CM | POA: Diagnosis present

## 2017-08-21 DIAGNOSIS — H9190 Unspecified hearing loss, unspecified ear: Secondary | ICD-10-CM | POA: Diagnosis present

## 2017-08-21 DIAGNOSIS — I4891 Unspecified atrial fibrillation: Secondary | ICD-10-CM | POA: Diagnosis present

## 2017-08-21 DIAGNOSIS — Z7982 Long term (current) use of aspirin: Secondary | ICD-10-CM | POA: Diagnosis not present

## 2017-08-21 DIAGNOSIS — M542 Cervicalgia: Secondary | ICD-10-CM | POA: Diagnosis not present

## 2017-08-21 DIAGNOSIS — J181 Lobar pneumonia, unspecified organism: Secondary | ICD-10-CM

## 2017-08-21 DIAGNOSIS — Z87891 Personal history of nicotine dependence: Secondary | ICD-10-CM

## 2017-08-21 DIAGNOSIS — J189 Pneumonia, unspecified organism: Secondary | ICD-10-CM | POA: Diagnosis present

## 2017-08-21 DIAGNOSIS — Z79899 Other long term (current) drug therapy: Secondary | ICD-10-CM | POA: Diagnosis not present

## 2017-08-21 DIAGNOSIS — I10 Essential (primary) hypertension: Secondary | ICD-10-CM | POA: Diagnosis present

## 2017-08-21 DIAGNOSIS — Y92002 Bathroom of unspecified non-institutional (private) residence single-family (private) house as the place of occurrence of the external cause: Secondary | ICD-10-CM | POA: Diagnosis not present

## 2017-08-21 DIAGNOSIS — Z91018 Allergy to other foods: Secondary | ICD-10-CM | POA: Diagnosis not present

## 2017-08-21 LAB — CBC WITH DIFFERENTIAL/PLATELET
Basophils Absolute: 0 10*3/uL (ref 0.0–0.1)
Basophils Relative: 0 %
Eosinophils Absolute: 0 10*3/uL (ref 0.0–0.7)
Eosinophils Relative: 0 %
HEMATOCRIT: 37.7 % — AB (ref 39.0–52.0)
HEMOGLOBIN: 11.8 g/dL — AB (ref 13.0–17.0)
LYMPHS ABS: 0.5 10*3/uL — AB (ref 0.7–4.0)
LYMPHS PCT: 5 %
MCH: 30.9 pg (ref 26.0–34.0)
MCHC: 31.3 g/dL (ref 30.0–36.0)
MCV: 98.7 fL (ref 78.0–100.0)
MONOS PCT: 3 %
Monocytes Absolute: 0.4 10*3/uL (ref 0.1–1.0)
NEUTROS ABS: 11 10*3/uL — AB (ref 1.7–7.7)
Neutrophils Relative %: 92 %
Platelets: 229 10*3/uL (ref 150–400)
RBC: 3.82 MIL/uL — ABNORMAL LOW (ref 4.22–5.81)
RDW: 13.9 % (ref 11.5–15.5)
WBC: 11.9 10*3/uL — ABNORMAL HIGH (ref 4.0–10.5)

## 2017-08-21 LAB — URINALYSIS, ROUTINE W REFLEX MICROSCOPIC
BILIRUBIN URINE: NEGATIVE
GLUCOSE, UA: NEGATIVE mg/dL
HGB URINE DIPSTICK: NEGATIVE
KETONES UR: NEGATIVE mg/dL
Nitrite: NEGATIVE
PH: 6 (ref 5.0–8.0)
Protein, ur: 30 mg/dL — AB
Specific Gravity, Urine: 1.013 (ref 1.005–1.030)

## 2017-08-21 LAB — COMPREHENSIVE METABOLIC PANEL
ALT: 13 U/L — ABNORMAL LOW (ref 17–63)
ANION GAP: 10 (ref 5–15)
AST: 35 U/L (ref 15–41)
Albumin: 3.8 g/dL (ref 3.5–5.0)
Alkaline Phosphatase: 208 U/L — ABNORMAL HIGH (ref 38–126)
BILIRUBIN TOTAL: 1.3 mg/dL — AB (ref 0.3–1.2)
BUN: 30 mg/dL — AB (ref 6–20)
CO2: 24 mmol/L (ref 22–32)
Calcium: 9.2 mg/dL (ref 8.9–10.3)
Chloride: 104 mmol/L (ref 101–111)
Creatinine, Ser: 1.24 mg/dL (ref 0.61–1.24)
GFR, EST AFRICAN AMERICAN: 53 mL/min — AB (ref >60)
GFR, EST NON AFRICAN AMERICAN: 46 mL/min — AB (ref >60)
Glucose, Bld: 100 mg/dL — ABNORMAL HIGH (ref 65–99)
Potassium: 4 mmol/L (ref 3.5–5.1)
Sodium: 138 mmol/L (ref 135–145)
TOTAL PROTEIN: 8.1 g/dL (ref 6.5–8.1)

## 2017-08-21 LAB — LIPASE, BLOOD: Lipase: 26 U/L (ref 11–51)

## 2017-08-21 LAB — INFLUENZA PANEL BY PCR (TYPE A & B)
INFLAPCR: NEGATIVE
INFLBPCR: NEGATIVE

## 2017-08-21 LAB — PROCALCITONIN: Procalcitonin: 2.42 ng/mL

## 2017-08-21 MED ORDER — TRAMADOL HCL 50 MG PO TABS
50.0000 mg | ORAL_TABLET | Freq: Four times a day (QID) | ORAL | Status: DC | PRN
  Administered 2017-08-21 – 2017-08-22 (×2): 50 mg via ORAL
  Filled 2017-08-21 (×2): qty 1

## 2017-08-21 MED ORDER — DEXTROSE 5 % IV SOLN
500.0000 mg | Freq: Once | INTRAVENOUS | Status: AC
  Administered 2017-08-21: 500 mg via INTRAVENOUS
  Filled 2017-08-21: qty 500

## 2017-08-21 MED ORDER — ASPIRIN 325 MG PO TABS
325.0000 mg | ORAL_TABLET | Freq: Every day | ORAL | Status: DC
  Administered 2017-08-22 – 2017-08-23 (×2): 325 mg via ORAL
  Filled 2017-08-21 (×2): qty 1

## 2017-08-21 MED ORDER — FINASTERIDE 5 MG PO TABS
5.0000 mg | ORAL_TABLET | Freq: Every day | ORAL | Status: DC
  Administered 2017-08-22 – 2017-08-23 (×2): 5 mg via ORAL
  Filled 2017-08-21 (×3): qty 1

## 2017-08-21 MED ORDER — ALLOPURINOL 100 MG PO TABS
100.0000 mg | ORAL_TABLET | Freq: Every day | ORAL | Status: DC
  Administered 2017-08-22 – 2017-08-23 (×2): 100 mg via ORAL
  Filled 2017-08-21 (×2): qty 1

## 2017-08-21 MED ORDER — LEVOTHYROXINE SODIUM 50 MCG PO TABS
50.0000 ug | ORAL_TABLET | Freq: Every day | ORAL | Status: DC
  Administered 2017-08-22 – 2017-08-23 (×2): 50 ug via ORAL
  Filled 2017-08-21 (×2): qty 1

## 2017-08-21 MED ORDER — ENOXAPARIN SODIUM 30 MG/0.3ML ~~LOC~~ SOLN: 30.0000 mg | SUBCUTANEOUS | Status: DC

## 2017-08-21 MED ORDER — ISOSORBIDE DINITRATE 20 MG PO TABS
10.0000 mg | ORAL_TABLET | Freq: Two times a day (BID) | ORAL | Status: DC
  Administered 2017-08-21 – 2017-08-23 (×4): 10 mg via ORAL
  Filled 2017-08-21 (×4): qty 1

## 2017-08-21 MED ORDER — AMLODIPINE BESYLATE 5 MG PO TABS
5.0000 mg | ORAL_TABLET | Freq: Every day | ORAL | Status: DC
  Administered 2017-08-22 – 2017-08-23 (×2): 5 mg via ORAL
  Filled 2017-08-21 (×2): qty 1

## 2017-08-21 MED ORDER — PREDNISONE 10 MG PO TABS
10.0000 mg | ORAL_TABLET | Freq: Every day | ORAL | Status: DC
  Administered 2017-08-22: 10 mg via ORAL
  Filled 2017-08-21: qty 1

## 2017-08-21 MED ORDER — METAXALONE 800 MG PO TABS
400.0000 mg | ORAL_TABLET | Freq: Once | ORAL | Status: AC
  Administered 2017-08-21: 400 mg via ORAL
  Filled 2017-08-21: qty 1

## 2017-08-21 MED ORDER — PREDNISONE 20 MG PO TABS
20.0000 mg | ORAL_TABLET | Freq: Once | ORAL | Status: AC
  Administered 2017-08-21: 20 mg via ORAL
  Filled 2017-08-21: qty 1

## 2017-08-21 MED ORDER — SODIUM CHLORIDE 0.9 % IV SOLN
INTRAVENOUS | Status: DC
  Administered 2017-08-21 – 2017-08-23 (×2): via INTRAVENOUS

## 2017-08-21 MED ORDER — POLYETHYLENE GLYCOL 3350 17 G PO PACK
17.0000 g | PACK | Freq: Every day | ORAL | Status: DC
  Administered 2017-08-23: 17 g via ORAL
  Filled 2017-08-21 (×2): qty 1

## 2017-08-21 MED ORDER — ENSURE ENLIVE PO LIQD
237.0000 mL | Freq: Two times a day (BID) | ORAL | Status: DC
  Administered 2017-08-22 – 2017-08-23 (×2): 237 mL via ORAL

## 2017-08-21 MED ORDER — DEXTROSE 5 % IV SOLN
1.0000 g | Freq: Once | INTRAVENOUS | Status: AC
  Administered 2017-08-21: 1 g via INTRAVENOUS
  Filled 2017-08-21: qty 10

## 2017-08-21 MED ORDER — SODIUM CHLORIDE 0.9 % IV BOLUS (SEPSIS)
1000.0000 mL | Freq: Once | INTRAVENOUS | Status: AC
  Administered 2017-08-21: 1000 mL via INTRAVENOUS

## 2017-08-21 MED ORDER — HYDROCODONE-ACETAMINOPHEN 5-325 MG PO TABS
1.0000 | ORAL_TABLET | Freq: Once | ORAL | Status: DC
  Filled 2017-08-21: qty 1

## 2017-08-21 MED ORDER — DEXTROSE 5 % IV SOLN
500.0000 mg | INTRAVENOUS | Status: DC
  Administered 2017-08-22 – 2017-08-23 (×2): 500 mg via INTRAVENOUS
  Filled 2017-08-21 (×2): qty 500

## 2017-08-21 MED ORDER — DEXTROSE 5 % IV SOLN
1.0000 g | INTRAVENOUS | Status: DC
  Administered 2017-08-22 – 2017-08-23 (×2): 1 g via INTRAVENOUS
  Filled 2017-08-21 (×3): qty 10

## 2017-08-21 MED ORDER — ACETAMINOPHEN 325 MG PO TABS
650.0000 mg | ORAL_TABLET | Freq: Once | ORAL | Status: AC
  Administered 2017-08-21: 650 mg via ORAL
  Filled 2017-08-21: qty 2

## 2017-08-21 NOTE — ED Notes (Signed)
Pt transported to xray 

## 2017-08-21 NOTE — ED Provider Notes (Addendum)
[facility] Provider Note   CSN: [id number] Arrival date & time: 08/21/17  1310     History   Chief Complaint Chief Complaint  Patient presents with  . Neck Pain    HPI [facility] is a 83 y.o. male.  Patient presents c/o left sided neck pain. Has had similar pain on chronic, recurrent basis for the past 1+ year.   Pt denies acute injury/trauma to area. No new numbness or weakness. Took an ultram at 10 am, but pain persists. Pain located left neck posteriorly, occasionally radiates to left shoulder and down towards left scapula. Denies fevers. No severe headache. No nv.    The history is provided by the patient.  Neck Pain   Pertinent negatives include no chest pain, no numbness and no weakness.    Past Medical History:  Diagnosis Date  . Anemia   . Arthritis    "just my legs" (04/06/2013)  . Assistance needed for mobility    walks with walker  . Constipation   . Gout   . HOH (hard of hearing)   . Hyperlipidemia   . Hypertension   . Hypothyroidism   . Kidney stones    "only once" (04/06/2013)  . Lumbar foraminal stenosis 06/04/2016  . 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 Rheumatism   . SIRS (systemic inflammatory response syndrome) ([facility]) 02/2013   [facility] May 03/19/2013  (04/06/2013)  . Small bowel obstruction [facility])     Patient Active Problem List   Diagnosis Date Noted  . Bradycardia 01/23/2017  . Atrial fibrillation ([place]) 01/23/2017  . Dehydration 08/19/2016  . Elevated troponin 08/18/2016  . AKI (acute kidney injury) ([place]) 08/18/2016  . Difficulty in walking, not elsewhere classified   . Gait instability   . Left leg cellulitis 06/05/2016  . Orthostatic hypotension 06/04/2016  . 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 Lumbar foraminal stenosis 06/04/2016  . DJD (degenerative joint disease), multiple sites 06/04/2016  . Fever 06/04/2016  . Leukocytosis 06/04/2016  . Stasis dermatitis of both legs  07/01/2013  . Essential hypertension 03/15/2013  . Hypothyroidism, adult 03/18/2009  . CONSTIPATION 03/18/2009  . CHANGE IN BOWELS 03/18/2009    Past Surgical History:  Procedure Laterality Date  . CYSTOSCOPY W/ STONE MANIPULATION  ?1986  . NASAL FRACTURE SURGERY    . TRANSURETHRAL RESECTION OF PROSTATE         Home Medications    Prior to Admission medications   Medication Sig Start Date End Date Taking? Authorizing Provider  allopurinol (ZYLOPRIM) 100 MG tablet Take 100 mg by mouth daily.    [provider]  amLODipine (NORVASC) 5 MG tablet Take 5 mg by mouth daily. 01/31/17   [provider]  aspirin 325 MG tablet Take 325 mg by mouth daily.    [provider]  finasteride (PROSCAR) 5 MG tablet Take 5 mg by mouth daily.     [provider]  furosemide (LASIX) 20 MG tablet Take 1 tablet by mouth daily. 05/19/16   [provider]  isosorbide dinitrate (ISORDIL) 10 MG tablet Take 10 mg by mouth 2 (two) times daily.    [provider]  levothyroxine (SYNTHROID, LEVOTHROID) 50 MCG tablet Take 50 mcg by mouth daily before breakfast.    [provider]  meclizine (ANTIVERT) 25 MG tablet Take 25 mg by mouth 3 (three) times daily as needed (inner ear problems).    [provider]  methocarbamol (ROBAXIN) 500 MG tablet Take 1 tablet (  500 mg total) by mouth every 6 (six) hours as needed for muscle spasms. 01/23/17   Kathie Dike, MD  polyethylene glycol (MIRALAX / GLYCOLAX) packet Take 17 g by mouth daily.    [provider]  potassium chloride SA (K-DUR,KLOR-CON) 20 MEQ tablet Take 0.5 tablets (10 mEq total) by mouth daily. 06/07/16   Rexene Alberts, MD  traMADol (ULTRAM) 50 MG tablet Take 1 tablet (50 mg total) by mouth every 6 (six) hours as needed for moderate pain. 01/23/17   Kathie Dike, MD    Family History Family History  Problem Relation Age of Onset  . Colon cancer Mother   . Prostate cancer  Father   . Heart disease Father   . Colon polyps Brother        x 2  . Heart disease Brother     Social History Social History   Tobacco Use  . Smoking status: Former Smoker    Packs/day: 0.50    Types: Cigarettes  . Smokeless tobacco: Never Used  . Tobacco comment: 04/06/2013 "quit smoking ~ 45-50 yr ago"  Substance Use Topics  . Alcohol use: No    Comment: 04/06/2013 "last time I've had any alcohol was 09/12/1945; never had a problem w/it"  . Drug use: No     Allergies   Codeine; Grapefruit concentrate; Other; and Pineapple   Review of Systems Review of Systems  Constitutional: Negative for fever.  Respiratory: Negative for shortness of breath.   Cardiovascular: Negative for chest pain.  Musculoskeletal: Positive for neck pain.  Skin: Negative for rash.  Neurological: Negative for weakness and numbness.     Physical Exam Updated Vital Signs BP (!) 116/55 (BP Location: Right Arm)   Pulse 76   Temp (!) 97.4 F (36.3 C) (Oral)   Resp 18   Ht 1.803 m (5\' 11" )   Wt 83.9 kg (185 lb)   SpO2 95%   BMI 25.80 kg/m   Physical Exam  Constitutional: He appears well-developed and well-nourished. No distress.  HENT:  Head: Atraumatic.  Mouth/Throat: Oropharynx is clear and moist.  Eyes: Conjunctivae are normal.  Neck: Neck supple. No tracheal deviation present.  No stiffness or rigidity.  Cardiovascular: Normal rate, regular rhythm, normal heart sounds and intact distal pulses.  Pulmonary/Chest: Effort normal and breath sounds normal. No accessory muscle usage. No respiratory distress.  Abdominal: He exhibits no distension. There is no tenderness.  Musculoskeletal: He exhibits no edema.  Left neck and left trapezius muscular tenderness reproducing symptoms. No sts. No skin changes/lesions. C/T spine non tender aligned, no step off.  Neurological: He is alert.  Alert. Motor intact bil extremities, stre 5/5. sens grossly intact.   Skin: Skin is warm and dry. No rash  noted. He is not diaphoretic.  Psychiatric: He has a normal mood and affect.  Nursing note and vitals reviewed.    ED Treatments / Results  Labs (all labs ordered are listed, but only abnormal results are displayed) Labs Reviewed - No data to display  EKG  EKG Interpretation None       Radiology No results found.  Procedures Procedures (including critical care time)  Medications Ordered in ED Medications  predniSONE (DELTASONE) tablet 20 mg (not administered)  acetaminophen (TYLENOL) tablet 650 mg (not administered)     Initial Impression / Assessment and Plan / ED Course  I have reviewed the triage vital signs and the nursing notes.  Pertinent labs & imaging results that were available during my care of  the patient were reviewed by me and considered in my medical decision making (see chart for details).  Pt had ultram earlier.   Reviewed nursing notes and prior charts for additional history.   Pt with prior imaging for same type of symptoms, including several months ago, CTA neck/head, and plain films.  Prior neck ct imaging with ddd, foraminal narrowing.  On review of remote prior imaging ? Pancreatic mass as well.    Prednisone po.  Acetaminophen po.  Heat pad applied.  Family member indicates spasm/pain persist.  Hydrocodone 1 po. Skelaxin po.  Daughter present. Requests lab work be done.  ?hx uti. On recheck pt w low grade fever, 100.1 -  Will add labs, ua, cxr, to workup.   Signed out to Dr Dayna Barker to check results when back.  If uti or pna, would admit for tx.          Final Clinical Impressions(s) / ED Diagnoses   Final diagnoses:  None    ED Discharge Orders    None            Lajean Saver, MD 08/21/17 1547

## 2017-08-21 NOTE — ED Notes (Signed)
Pt given heat pack and applied to back of neck where pt states his pain is radiating from.

## 2017-08-21 NOTE — H&P (Signed)
History and Physical    [facility] DOB: [birth year] DOA: 08/21/2017  PCP: Dione Housekeeper, MD Consultants:  None Patient coming from:  Home - lives with wife; NOK: daughter, son; [phone]  Chief Complaint: neck pain  HPI: [facility] May is a 83 y.o. male with medical history significant of hypothyroidism; afib; HTN; and HLD presenting with 2-3 days of pain in the back of the left neck.  It gave him a fit this AM.  He fell backwards into the bathtub last Sunday, lost his balance.  His wife was sick last week with "some kind of bacterial infection", was treated with abx.  Has a chronic cough, but worse now for the last few days.  No fever.  Not able to walk as well as he usually walks.  His son has noticed some SOB with exertion over the last week or two but he hasn't noticed it.  He has been confused today, but he often is forgetful due to his age.  ED Course:  CXR - LLL PNA.  Rocephin, Azithromycin, 1L IVF.  Blood cultures pending.  Given Skelaxin for neck pain.  Review of Systems: As per HPI; otherwise review of systems reviewed and negative.   Ambulatory Status:  Ambulates with a walker, still drives  Past Medical History:  Diagnosis Date  . Anemia   . Arthritis    "just my legs" (04/06/2013)  . Assistance needed for mobility    walks with walker  . Constipation   . Gout   . HOH (hard of hearing)   . Hyperlipidemia   . Hypertension   . Hypothyroidism   . Kidney stones    "only once" (04/06/2013)  . Lumbar foraminal stenosis 06/04/2016  . 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 Rheumatism   . SIRS (systemic inflammatory response syndrome) ([place]) 02/2013   [name] 03/19/2013  (04/06/2013)  . Small bowel obstruction [facility])     Past Surgical History:  Procedure Laterality Date  . CYSTOSCOPY W/ STONE MANIPULATION  ?1986  . NASAL FRACTURE SURGERY    . TRANSURETHRAL RESECTION OF PROSTATE      Social History   Socioeconomic History  .  Marital status: Married    Spouse name: Not on file  . Number of children: 2  . Years of education: Not on file  . Highest education level: Not on file  Social Needs  . Financial resource strain: Not on file  . Food insecurity - worry: Not on file  . Food insecurity - inability: Not on file  . Transportation needs - medical: Not on file  . Transportation needs - non-medical: Not on file  Occupational History  . Not on file  Tobacco Use  . Smoking status: Former Smoker    Packs/day: 0.50    Types: Cigarettes    Last attempt to quit: 1960    Years since quitting: 58.9  . Smokeless tobacco: Never Used  Substance and Sexual Activity  . Alcohol use: No    Comment: 04/06/2013 "last time I've had any alcohol was 09/12/1945; never had a problem w/it"  . Drug use: No  . Sexual activity: Not on file  Other Topics Concern  . Not on file  Social History Narrative  . Not on file    Allergies  Allergen Reactions  . Codeine Other (See Comments)    Passes out  . Grapefruit Concentrate     unknown  . Other     Potatoes, green beans, corn - unknown  .  Pineapple     unknown    Family History  Problem Relation Age of Onset  . Colon cancer Mother   . Prostate cancer Father   . Heart disease Father   . Colon polyps Brother        x 2  . Heart disease Brother     Prior to Admission medications   Medication Sig Start Date End Date Taking? Authorizing Provider  allopurinol (ZYLOPRIM) 100 MG tablet Take 100 mg by mouth daily.   Yes [provider]  amLODipine (NORVASC) 5 MG tablet Take 5 mg by mouth daily. 01/31/17  Yes [provider]  aspirin 325 MG tablet Take 325 mg by mouth daily.   Yes [provider]  finasteride (PROSCAR) 5 MG tablet Take 5 mg by mouth daily.    Yes [provider]  furosemide (LASIX) 20 MG tablet Take 1 tablet by mouth daily. 05/19/16  Yes [provider]  isosorbide dinitrate (ISORDIL) 10 MG tablet Take 10 mg by  mouth 2 (two) times daily.   Yes [provider]  levothyroxine (SYNTHROID, LEVOTHROID) 50 MCG tablet Take 50 mcg by mouth daily before breakfast.   Yes [provider]  meclizine (ANTIVERT) 25 MG tablet Take 25 mg by mouth 3 (three) times daily as needed (inner ear problems).   Yes [provider]  polyethylene glycol (MIRALAX / GLYCOLAX) packet Take 17 g by mouth daily.   Yes [provider]  traMADol (ULTRAM) 50 MG tablet Take 1 tablet (50 mg total) by mouth every 6 (six) hours as needed for moderate pain. 01/23/17  Yes Kathie Dike, MD    Physical Exam: Vitals:   08/21/17 1510 08/21/17 1705 08/21/17 1730 08/21/17 1829  BP:  127/68 (!) 120/57 122/60  Pulse:  70 90 85  Resp:  19 17 18   Temp: 100.1 F (37.8 C) 99.7 F (37.6 C)  98.2 F (36.8 C)  TempSrc: Oral Oral  Oral  SpO2:  91% 94% 94%  Weight:    77.1 kg (170 lb)  Height:    5\' 11"  (1.803 m)     General:  Appears calm and comfortable and is NAD Eyes:  PERRL, EOMI, normal lids, iris ENT:  Very hard of hearing, normal lips & tongue, mmm Neck:  no LAD, masses or thyromegaly Cardiovascular:  RRR, no m/r/g. No LE edema.  Respiratory:  LLL rhonchi.  Normal respiratory effort at rest but easily dyspneic when he attempts longer conversation. Abdomen:  soft, NT, ND, NABS Skin:  no rash or induration seen on limited exam Musculoskeletal:  grossly normal tone BUE/BLE, good ROM, no bony abnormality.   +TTP along the left trapezius muscle extending up onto the occiput and also along the posterior upper shoulder. Lower extremity:  No LE edema.  Limited foot exam with no ulcerations.  2+ distal pulses. Psychiatric:  grossly normal mood and affect, speech fluent and appropriate, AOx3 Neurologic:  CN 2-12 grossly intact, moves all extremities in coordinated fashion, sensation intact    Radiological Exams on Admission: Dg Chest 2 View  Result Date: 08/21/2017 CLINICAL DATA:  Fever, cough and weakness  today. EXAM: CHEST  2 VIEW COMPARISON:  08/18/2016 prior radiographs FINDINGS: Left lower lung airspace disease likely represents pneumonia. Upper limits normal heart size again noted. There is no evidence of pulmonary edema, suspicious pulmonary nodule/mass, pleural effusion, or pneumothorax. No acute bony abnormalities are identified. IMPRESSION: Left lower lung airspace disease likely representing pneumonia. Electronically Signed   By:  Margarette Canada M.D.   On: 08/21/2017 16:04    EKG: Independently reviewed.  NSR with rate 77; low voltage  with no evidence of acute ischemia   Labs on Admission: I have personally reviewed the available labs and imaging studies at the time of the admission.  Pertinent labs:   Glucose 100 BUN 30/Creatinine 1.24/BUN 46 - stable AP 208; 195 in 4/18 WBC 11.9 Hgb 11.8  - stable UA: moderate LE, 30 protein, rare bacteria  Assessment/Plan Principal Problem:   CAP (community acquired pneumonia) Active Problems:   Hypothyroidism, adult   Essential hypertension   Atrial fibrillation (HCC)   Neck pain   CAP -Given acute on chronic cough, mildly decreased oxygen saturation, and infiltrate in left lower lobe on chest x-ray , most likely community-acquired pneumonia.  - Other etiologies include aspiration versus URI (most likely viral). -Influenza pending. -CURB-65 score is 3+, meaning that the patient has a 14.5% risk of death. -Pneumonia Severity Index (PSI) is Class 5, 27% mortality. -Corticosteroids have been to shown to low overall mortality rate; risk of ARDS; and need for mechanical ventilation.  This is particularly true in severe PNA (class 3+ PSI).  Will add 20 mg prednisone daily for 3-7 days (already given today in the ER). -Will start Azithromycin 500 mg daily AND Rocephin due to no risk factors for MDR cause. -NS @ 75cc/hr -Fever control -Repeat CBC in am -Sputum cultures -Blood cultures -Strep pneumo testing -Will order lower respiratory tract  procalcitonin level.  Antibiotics would not be indicated for PCT <0.1 and probably should not be used for < 0.25.  >0.5 indicates infection and >>0.5 indicates more serious disease.  As the procalcitonin level normalizes, it will be reasonable to consider de-escalation of antibiotic coverage.  Neck pain -Suspect MSK strain from his recent fall -Tylenol, Ultram as needed for pain  Hypothyroidism -Normal TSH in 5/18, will not recheck at this time -Continue Synthroid at current dose for now  HTN -Continue Norvasc  Afib -Rate controlled without medication -Takes ASA 325 mg for [facility]   DVT prophylaxis: Lovenox Code Status: Full - confirmed with patient/family - although they are interested in a Chaplain consult to discuss advanced directives further Family Communication: Son and daughter both present throughout evaluation  Disposition Plan:  Home once clinically improved Consults called: None  Admission status: Admit - It is my clinical opinion that admission to INPATIENT is reasonable and necessary because this patient will require at least 2 midnights in the hospital to treat this condition based on the medical complexity of the problems presented.  Given the aforementioned information, the predictability of an adverse outcome is felt to be significant.    Karmen Bongo MD [facility]  If note is complete, please contact covering daytime or nighttime physician. [email or web address] Password [facility]  08/21/2017, 6:45 PM

## 2017-08-21 NOTE — ED Notes (Signed)
Daughter requesting blood work. Pt appears more confused than when he arrived. Stating he feels like he is going to fall while in bed. Pt skin warm to touch and temp rechecked 100.1.EDP notifed and labs ordered

## 2017-08-21 NOTE — ED Provider Notes (Signed)
3:48 PM Assumed care from Dr. Ashok Cordia, please see their note for full history, physical and decision making until this point. In brief this is a 83 y.o. year old male who presented to the ED tonight with Neck Pain     Neck pain is chronic. But patient is more confused than normal, pending infectious workup.  On my exam, is alert and oriented per baseline. Borderline tachycardic, temperature of 100.1 oral, o2 of 92. Congested sounding with rhonchi and mild crackles in LLL. Concern for pneumonia vs UTI (has had those in past). Will await labs/xr and likely antibiotics.   xr c/w pneumonia as well. High risk 2/2 age, will admit for observation, iv antibiotics.   Labs, studies and imaging reviewed by myself and considered in medical decision making if ordered. Imaging interpreted by radiology.  Labs Reviewed  CBC WITH DIFFERENTIAL/PLATELET - Abnormal; Notable for the following components:      Result Value   WBC 11.9 (*)    RBC 3.82 (*)    Hemoglobin 11.8 (*)    HCT 37.7 (*)    Neutro Abs 11.0 (*)    Lymphs Abs 0.5 (*)    All other components within normal limits  URINALYSIS, ROUTINE W REFLEX MICROSCOPIC  COMPREHENSIVE METABOLIC PANEL  LIPASE, BLOOD    DG Chest 2 View    (Results Pending)    No Follow-up on file.    Merrily Pew, MD 08/21/17 2230

## 2017-08-21 NOTE — ED Triage Notes (Addendum)
Pt brought in by EMS due to neck pain. EMS reports that pt fell last Sunday while in the BR, but didn't hurt anything at the time. Reports chronic next pain for 6 months but has gotten worse over the last day. Pain coming and going. worsening at 12 pmReports neck is hurting to left side and down into muscle sin back. And feels achey all over. Reports taking 3 tramadol every day

## 2017-08-22 DIAGNOSIS — E039 Hypothyroidism, unspecified: Secondary | ICD-10-CM

## 2017-08-22 DIAGNOSIS — J181 Lobar pneumonia, unspecified organism: Secondary | ICD-10-CM

## 2017-08-22 DIAGNOSIS — I10 Essential (primary) hypertension: Secondary | ICD-10-CM

## 2017-08-22 LAB — BASIC METABOLIC PANEL
ANION GAP: 9 (ref 5–15)
BUN: 28 mg/dL — ABNORMAL HIGH (ref 6–20)
CHLORIDE: 103 mmol/L (ref 101–111)
CO2: 23 mmol/L (ref 22–32)
Calcium: 8.4 mg/dL — ABNORMAL LOW (ref 8.9–10.3)
Creatinine, Ser: 1.11 mg/dL (ref 0.61–1.24)
GFR calc non Af Amer: 52 mL/min — ABNORMAL LOW (ref >60)
GLUCOSE: 132 mg/dL — AB (ref 65–99)
POTASSIUM: 3.8 mmol/L (ref 3.5–5.1)
Sodium: 135 mmol/L (ref 135–145)

## 2017-08-22 LAB — CBC WITH DIFFERENTIAL/PLATELET
BASOS PCT: 0 %
Basophils Absolute: 0 10*3/uL (ref 0.0–0.1)
Eosinophils Absolute: 0 10*3/uL (ref 0.0–0.7)
Eosinophils Relative: 0 %
HEMATOCRIT: 31.6 % — AB (ref 39.0–52.0)
HEMOGLOBIN: 10 g/dL — AB (ref 13.0–17.0)
LYMPHS PCT: 7 %
Lymphs Abs: 1.3 10*3/uL (ref 0.7–4.0)
MCH: 31.2 pg (ref 26.0–34.0)
MCHC: 31.6 g/dL (ref 30.0–36.0)
MCV: 98.4 fL (ref 78.0–100.0)
MONO ABS: 0.8 10*3/uL (ref 0.1–1.0)
MONOS PCT: 4 %
NEUTROS ABS: 17 10*3/uL — AB (ref 1.7–7.7)
NEUTROS PCT: 89 %
Platelets: 213 10*3/uL (ref 150–400)
RBC: 3.21 MIL/uL — ABNORMAL LOW (ref 4.22–5.81)
RDW: 14 % (ref 11.5–15.5)
WBC: 19 10*3/uL — ABNORMAL HIGH (ref 4.0–10.5)

## 2017-08-22 LAB — URINE CULTURE

## 2017-08-22 MED ORDER — ENOXAPARIN SODIUM 40 MG/0.4ML ~~LOC~~ SOLN: 40.0000 mg | SUBCUTANEOUS | Status: DC

## 2017-08-22 NOTE — Progress Notes (Signed)
Initial Nutrition Assessment  DOCUMENTATION CODES:      INTERVENTION:  Regular diet and provide snacks as desired   NUTRITION DIAGNOSIS:   Increased nutrient needs related to (age and acute illness) as evidenced by estimated needs.   GOAL:   Patient will meet greater than or equal to 90% of their needs  MONITOR:   PO intake, Weight trends, Skin, Labs  REASON FOR ASSESSMENT:   Malnutrition Screening Tool    ASSESSMENT: A 83 yo male who fell and presents from home with neck pain. Hx of hypothyroidism,  Gout, Constipation, Hard of Hearing. Chest x-ray finding suggesting pneumonia on admission. Home diet is regular. Feeds himself. History limited at this time -the patient urgently needing to go to bathroom. He ate 65% of his breakfast this morning. Nutrition services staff are obtaining meal choices for him. If he maintains at least 65% of meals he will consume low-end of target range for est nutrition needs.  Weight history shows a gradual loss of 4% (2.7 kg) in 8 months. Not significant for timeframe. Usual range 79-83 kg the past 12+ months.   Recent Labs  Lab 08/21/17 1514 08/22/17 0501  NA 138 135  K 4.0 3.8  CL 104 103  CO2 24 23  BUN 30* 28*  CREATININE 1.24 1.11  CALCIUM 9.2 8.4*  GLUCOSE 100* 132*   Labs and meds reviewed. BUN 28, Hgb- 10   NUTRITION - FOCUSED PHYSICAL EXAM: Mild muscle loss expected given his age  Diet Order:  Diet regular Room service appropriate? Yes; Fluid consistency: Thin  EDUCATION NEEDS:   No education needs have been identified at this time Skin:  Skin Assessment: Reviewed RN Assessment- dryness and abrasion   Last BM:  12/1  Height:   Ht Readings from Last 1 Encounters:  08/21/17 5\' 11"  (1.803 m)    Weight:   Wt Readings from Last 1 Encounters:  08/21/17 170 lb (77.1 kg)    Ideal Body Weight:  78 kg  BMI:  Body mass index is 23.71 kg/m.  Estimated Nutritional Needs:   Kcal:  1937-9024   Protein:  77-80  gr  Fluid:  1.7-1.9 liters daily   Colman Cater MS,RD,CSG,LDN Office: [phone] Pager: [phone]

## 2017-08-22 NOTE — Progress Notes (Addendum)
PROGRESS NOTE    Mario May  ZOX:[id number] DOB: 04-04-1915 DOA: 08/21/2017 PCP: Dione Housekeeper, MD     Brief Narrative:  83 year old man who looks remarkably younger than his stated age was admitted from home on 12/2 due to neck pain and cough.  He was found to have community-acquired pneumonia and admission was requested.   Assessment & Plan:   Principal Problem:   CAP (community acquired pneumonia) Active Problems:   Hypothyroidism, adult   Essential hypertension   Atrial fibrillation (HCC)   Neck pain   Community-acquired pneumonia -He currently looks nontoxic, is clinically improving, has no current oxygen requirements. -Culture data remains negative to date. -He does have some streaky hemoptysis with coughing, will continue to monitor and recheck CBC in a.m. -We will take him off Lovenox for DVT prophylaxis. -Continue Rocephin and azithromycin. -We will DC steroids. -Has increase in leukocytosis today which is likely steroid related.  Hypothyroidism -Continue Synthroid.  Hypertension -Well-controlled, continue home medications.  Neck pain  -Resolved, suspect muscular from coughing.   DVT prophylaxis: SCDs Code Status: Full code Family Communication: Wife and daughter at bedside updated on plan of care and all questions answered Disposition Plan: Home when ready, anticipate 24-48 hours  Consultants:   None  Procedures:   None  Antimicrobials:  Anti-infectives (From admission, onward)   Start     Dose/Rate Route Frequency Ordered Stop   08/22/17 0800  cefTRIAXone (ROCEPHIN) 1 g in dextrose 5 % 50 mL IVPB     1 g 100 mL/hr over 30 Minutes Intravenous Every 24 hours 08/21/17 1829 08/29/17 0759   08/22/17 0800  azithromycin (ZITHROMAX) 500 mg in dextrose 5 % 250 mL IVPB     500 mg 250 mL/hr over 60 Minutes Intravenous Every 24 hours 08/21/17 1829 08/29/17 0759   08/21/17 1615  cefTRIAXone (ROCEPHIN) 1 g in dextrose 5 % 50 mL IVPB     1 g 100  mL/hr over 30 Minutes Intravenous  Once 08/21/17 1610 08/21/17 1728   08/21/17 1615  azithromycin (ZITHROMAX) 500 mg in dextrose 5 % 250 mL IVPB     500 mg 250 mL/hr over 60 Minutes Intravenous  Once 08/21/17 1610 08/21/17 1830       Subjective: Feels well, no complaints, has some streaky hemoptysis  Objective: Vitals:   08/21/17 2027 08/22/17 0532 08/22/17 1013 08/22/17 1520  BP: 120/77 (!) 120/59 (!) 132/58 (!) 118/47  Pulse: 82 80  (!) 48  Resp: 18 16  16   Temp: 98.5 F (36.9 C) 98.4 F (36.9 C)  98 F (36.7 C)  TempSrc: Oral Oral  Oral  SpO2: 95% 94%  99%  Weight:      Height:        Intake/Output Summary (Last 24 hours) at 08/22/2017 1523 Last data filed at 08/22/2017 1244 Gross per 24 hour  Intake 1123.75 ml  Output 700 ml  Net 423.75 ml   Filed Weights   08/21/17 1314 08/21/17 1829  Weight: 83.9 kg (185 lb) 77.1 kg (170 lb)    Examination:  General exam: Alert, awake, oriented x 3, very hard of hearing Respiratory system: Clear to auscultation. Respiratory effort normal. Cardiovascular system:RRR. No murmurs, rubs, gallops. Gastrointestinal system: Abdomen is nondistended, soft and nontender. No organomegaly or masses felt. Normal bowel sounds heard. Central nervous system: Alert and oriented. No focal neurological deficits. Extremities: No C/C/E, +pedal pulses Skin: No rashes, lesions or ulcers Psychiatry: Judgement and insight appear normal. Mood & affect appropriate.  Data Reviewed: I have personally reviewed following labs and imaging studies  CBC: Recent Labs  Lab 08/21/17 1514 08/22/17 0501  WBC 11.9* 19.0*  NEUTROABS 11.0* 17.0*  HGB 11.8* 10.0*  HCT 37.7* 31.6*  MCV 98.7 98.4  PLT 229 132   Basic Metabolic Panel: Recent Labs  Lab 08/21/17 1514 08/22/17 0501  NA 138 135  K 4.0 3.8  CL 104 103  CO2 24 23  GLUCOSE 100* 132*  BUN 30* 28*  CREATININE 1.24 1.11  CALCIUM 9.2 8.4*   GFR: Estimated Creatinine Clearance: 37.7  mL/min (by C-G formula based on SCr of 1.11 mg/dL). Liver Function Tests: Recent Labs  Lab 08/21/17 1514  AST 35  ALT 13*  ALKPHOS 208*  BILITOT 1.3*  PROT 8.1  ALBUMIN 3.8   Recent Labs  Lab 08/21/17 1514  LIPASE 26   No results for input(s): AMMONIA in the last 168 hours. Coagulation Profile: No results for input(s): INR, PROTIME in the last 168 hours. Cardiac Enzymes: No results for input(s): CKTOTAL, CKMB, CKMBINDEX, TROPONINI in the last 168 hours. BNP (last 3 results) No results for input(s): PROBNP in the last 8760 hours. HbA1C: No results for input(s): HGBA1C in the last 72 hours. CBG: No results for input(s): GLUCAP in the last 168 hours. Lipid Profile: No results for input(s): CHOL, HDL, LDLCALC, TRIG, CHOLHDL, LDLDIRECT in the last 72 hours. Thyroid Function Tests: No results for input(s): TSH, T4TOTAL, FREET4, T3FREE, THYROIDAB in the last 72 hours. Anemia Panel: No results for input(s): VITAMINB12, FOLATE, FERRITIN, TIBC, IRON, RETICCTPCT in the last 72 hours. Urine analysis:    Component Value Date/Time   COLORURINE YELLOW 08/21/2017 1503   APPEARANCEUR CLEAR 08/21/2017 1503   LABSPEC 1.013 08/21/2017 1503   PHURINE 6.0 08/21/2017 1503   GLUCOSEU NEGATIVE 08/21/2017 1503   HGBUR NEGATIVE 08/21/2017 1503   BILIRUBINUR NEGATIVE 08/21/2017 1503   KETONESUR NEGATIVE 08/21/2017 1503   PROTEINUR 30 (A) 08/21/2017 1503   UROBILINOGEN 0.2 10/02/2013 2114   NITRITE NEGATIVE 08/21/2017 1503   LEUKOCYTESUR MODERATE (A) 08/21/2017 1503   Sepsis Labs: @LABRCNTIP (procalcitonin:4,lacticidven:4)  ) Recent Results (from the past 240 hour(s))  Urine Culture     Status: Abnormal   Collection Time: 08/21/17  4:16 PM  Result Value Ref Range Status   Specimen Description URINE, CLEAN CATCH  Final   Special Requests NONE  Final   Culture MULTIPLE SPECIES PRESENT, SUGGEST RECOLLECTION (A)  Final   Report Status 08/22/2017 FINAL  Final  Blood culture (routine x 2)      Status: None (Preliminary result)   Collection Time: 08/21/17  4:24 PM  Result Value Ref Range Status   Specimen Description BLOOD RIGHT ARM  Final   Special Requests   Final    BOTTLES DRAWN AEROBIC AND ANAEROBIC Blood Culture results may not be optimal due to an inadequate volume of blood received in culture bottles   Culture NO GROWTH < 24 HOURS  Final   Report Status PENDING  Incomplete  Blood culture (routine x 2)     Status: None (Preliminary result)   Collection Time: 08/21/17  4:28 PM  Result Value Ref Range Status   Specimen Description RIGHT ANTECUBITAL  Final   Special Requests   Final    BOTTLES DRAWN AEROBIC AND ANAEROBIC Blood Culture results may not be optimal due to an inadequate volume of blood received in culture bottles   Culture NO GROWTH < 24 HOURS  Final   Report Status PENDING  Incomplete         Radiology Studies: Dg Chest 2 View  Result Date: 08/21/2017 CLINICAL DATA:  Fever, cough and weakness today. EXAM: CHEST  2 VIEW COMPARISON:  08/18/2016 prior radiographs FINDINGS: Left lower lung airspace disease likely represents pneumonia. Upper limits normal heart size again noted. There is no evidence of pulmonary edema, suspicious pulmonary nodule/mass, pleural effusion, or pneumothorax. No acute bony abnormalities are identified. IMPRESSION: Left lower lung airspace disease likely representing pneumonia. Electronically Signed   By: Margarette Canada M.D.   On: 08/21/2017 16:04        Scheduled Meds: . allopurinol  100 mg Oral Daily  . amLODipine  5 mg Oral Daily  . aspirin  325 mg Oral Daily  . enoxaparin (LOVENOX) injection  40 mg Subcutaneous Q24H  . feeding supplement (ENSURE ENLIVE)  237 mL Oral BID BM  . finasteride  5 mg Oral Daily  . isosorbide dinitrate  10 mg Oral BID  . levothyroxine  50 mcg Oral QAC breakfast  . polyethylene glycol  17 g Oral Daily  . predniSONE  10 mg Oral Q breakfast   Continuous Infusions: . sodium chloride 75 mL/hr at  08/21/17 1833  . azithromycin Stopped (08/22/17 1202)  . cefTRIAXone (ROCEPHIN)  IV Stopped (08/22/17 1044)     LOS: 1 day    Time spent: 25 minutes. Greater than 50% of this time was spent in direct contact with the patient coordinating care.     Lelon Frohlich, MD [facility] Pager [phone]  If 7PM-7AM, please contact night-coverage [email or web address] Password [email or web address] 08/22/2017, 3:23 PM

## 2017-08-23 ENCOUNTER — Encounter (HOSPITAL_COMMUNITY): Payer: Self-pay | Admitting: Primary Care

## 2017-08-23 LAB — STREP PNEUMONIAE URINARY ANTIGEN: STREP PNEUMO URINARY ANTIGEN: NEGATIVE

## 2017-08-23 LAB — CBC
HCT: 32.7 % — ABNORMAL LOW (ref 39.0–52.0)
Hemoglobin: 10.4 g/dL — ABNORMAL LOW (ref 13.0–17.0)
MCH: 31.3 pg (ref 26.0–34.0)
MCHC: 31.8 g/dL (ref 30.0–36.0)
MCV: 98.5 fL (ref 78.0–100.0)
PLATELETS: 189 10*3/uL (ref 150–400)
RBC: 3.32 MIL/uL — ABNORMAL LOW (ref 4.22–5.81)
RDW: 14 % (ref 11.5–15.5)
WBC: 11.7 10*3/uL — ABNORMAL HIGH (ref 4.0–10.5)

## 2017-08-23 MED ORDER — AZITHROMYCIN 250 MG PO TABS
500.0000 mg | ORAL_TABLET | Freq: Every day | ORAL | Status: DC
  Administered 2017-08-23: 500 mg via ORAL
  Filled 2017-08-23: qty 2

## 2017-08-23 MED ORDER — AMOXICILLIN-POT CLAVULANATE 875-125 MG PO TABS: 1.0000 | ORAL_TABLET | Freq: Two times a day (BID) | ORAL | 0 refills | Status: AC

## 2017-08-23 NOTE — Discharge Summary (Signed)
Physician Discharge Summary  [name]:[id number] DOB: [birth year] DOA: 08/21/2017  PCP: Dione Housekeeper, MD  Admit date: 08/21/2017 Discharge date: 08/23/2017  Time spent: 45 minutes  Recommendations for Outpatient Follow-up:  -Will be discharged home today. -Advised to follow up with PCP in 2 weeks. -Would recommend repeat CXR in 4-6 weeks to ensure complete resolution of PNA. -To complete a 7 day course of Augmentin.  Discharge Diagnoses:  Principal Problem:   CAP (community acquired pneumonia) Active Problems:   Hypothyroidism, adult   Essential hypertension   Atrial fibrillation ([place])   Neck pain   Discharge Condition: Stable and improved  Filed Weights   08/21/17 1314 08/21/17 1829  Weight: 83.9 kg (185 lb) 77.1 kg (170 lb)    History of present illness:  As per Dr. Lorin Mercy on 12/2: Mario May is a 83 y.o. male with medical history significant of hypothyroidism; afib; HTN; and HLD presenting with 2-3 days of pain in the back of the left neck.  It gave him a fit this AM.  He fell backwards into the bathtub last Sunday, lost his balance.  His wife was sick last week with "some kind of bacterial infection", was treated with abx.  Has a chronic cough, but worse now for the last few days.  No fever.  Not able to walk as well as he usually walks.  His son has noticed some SOB with exertion over the last week or two but he hasn't noticed it.  He has been confused today, but he often is forgetful due to his age.  ED Course:  CXR - LLL PNA.  Rocephin, Azithromycin, 1L IVF.  Blood cultures pending.  Given Skelaxin for neck pain.    Hospital Course:   Community-acquired pneumonia -He currently looks nontoxic, is clinically improving, has no current oxygen requirements. -Culture data remains negative at day 2. -Had some mild hemoptysis on day 1 which has resolved by time of DC. -We will discharge on 7 days of Augmentin to complete treatment for  pneumonia. -Would recommend a repeat chest x-ray in 4-6 weeks to ensure complete resolution of pneumonia.  Hypothyroidism -Continue Synthroid.  Hypertension -Well-controlled, continue home medications.  Neck pain  -Resolved, suspect muscular from coughing.     Procedures:  None   Consultations:  None  Discharge Instructions  Discharge Instructions    Diet - low sodium heart healthy   Complete by:  As directed    Increase activity slowly   Complete by:  As directed      Allergies as of 08/23/2017      Reactions   Codeine Other (See Comments)   Passes out   Grapefruit Concentrate    unknown   Other    Potatoes, green beans, corn - unknown   Pineapple    unknown      Medication List    TAKE these medications   allopurinol 100 MG tablet Commonly known as:  ZYLOPRIM Take 100 mg by mouth daily.   amLODipine 5 MG tablet Commonly known as:  NORVASC Take 5 mg by mouth daily.   amoxicillin-clavulanate 875-125 MG tablet Commonly known as:  AUGMENTIN Take 1 tablet by mouth 2 (two) times daily for 7 days.   aspirin 325 MG tablet Take 325 mg by mouth daily.   finasteride 5 MG tablet Commonly known as:  PROSCAR Take 5 mg by mouth daily.   furosemide 20 MG tablet Commonly known as:  LASIX Take 1 tablet by mouth  daily.   isosorbide dinitrate 10 MG tablet Commonly known as:  ISORDIL Take 10 mg by mouth 2 (two) times daily.   levothyroxine 50 MCG tablet Commonly known as:  SYNTHROID, LEVOTHROID Take 50 mcg by mouth daily before breakfast.   meclizine 25 MG tablet Commonly known as:  ANTIVERT Take 25 mg by mouth 3 (three) times daily as needed (inner ear problems).   polyethylene glycol packet Commonly known as:  MIRALAX / GLYCOLAX Take 17 g by mouth daily.   traMADol 50 MG tablet Commonly known as:  ULTRAM Take 1 tablet (50 mg total) by mouth every 6 (six) hours as needed for moderate pain.      Allergies  Allergen Reactions  . Codeine  Other (See Comments)    Passes out  . Grapefruit Concentrate     unknown  . Other     Potatoes, green beans, corn - unknown  . Pineapple     unknown   Follow-up Information    Dione Housekeeper, MD In 2 weeks.   Specialty:  Family Medicine Contact information: [address] [place] [phone]            The results of significant diagnostics from this hospitalization (including imaging, microbiology, ancillary and laboratory) are listed below for reference.    Significant Diagnostic Studies: Dg Chest 2 View  Result Date: 08/21/2017 CLINICAL DATA:  Fever, cough and weakness today. EXAM: CHEST  2 VIEW COMPARISON:  08/18/2016 prior radiographs FINDINGS: Left lower lung airspace disease likely represents pneumonia. Upper limits normal heart size again noted. There is no evidence of pulmonary edema, suspicious pulmonary nodule/mass, pleural effusion, or pneumothorax. No acute bony abnormalities are identified. IMPRESSION: Left lower lung airspace disease likely representing pneumonia. Electronically Signed   By: Margarette Canada M.D.   On: 08/21/2017 16:04    Microbiology: Recent Results (from the past 240 hour(s))  Urine Culture     Status: Abnormal   Collection Time: 08/21/17  4:16 PM  Result Value Ref Range Status   Specimen Description URINE, CLEAN CATCH  Final   Special Requests NONE  Final   Culture MULTIPLE SPECIES PRESENT, SUGGEST RECOLLECTION (A)  Final   Report Status 08/22/2017 FINAL  Final  Blood culture (routine x 2)     Status: None (Preliminary result)   Collection Time: 08/21/17  4:24 PM  Result Value Ref Range Status   Specimen Description BLOOD RIGHT ARM  Final   Special Requests   Final    BOTTLES DRAWN AEROBIC AND ANAEROBIC Blood Culture results may not be optimal due to an inadequate volume of blood received in culture bottles   Culture NO GROWTH 2 DAYS  Final   Report Status PENDING  Incomplete  Blood culture (routine x 2)     Status: None  (Preliminary result)   Collection Time: 08/21/17  4:28 PM  Result Value Ref Range Status   Specimen Description RIGHT ANTECUBITAL  Final   Special Requests   Final    BOTTLES DRAWN AEROBIC AND ANAEROBIC Blood Culture results may not be optimal due to an inadequate volume of blood received in culture bottles   Culture NO GROWTH 2 DAYS  Final   Report Status PENDING  Incomplete     Labs: Basic Metabolic Panel: Recent Labs  Lab 08/21/17 1514 08/22/17 0501  NA 138 135  K 4.0 3.8  CL 104 103  CO2 24 23  GLUCOSE 100* 132*  BUN 30* 28*  CREATININE 1.24 1.11  CALCIUM  9.2 8.4*   Liver Function Tests: Recent Labs  Lab 08/21/17 1514  AST 35  ALT 13*  ALKPHOS 208*  BILITOT 1.3*  PROT 8.1  ALBUMIN 3.8   Recent Labs  Lab 08/21/17 1514  LIPASE 26   No results for input(s): AMMONIA in the last 168 hours. CBC: Recent Labs  Lab 08/21/17 1514 08/22/17 0501 08/23/17 0615  WBC 11.9* 19.0* 11.7*  NEUTROABS 11.0* 17.0*  --   HGB 11.8* 10.0* 10.4*  HCT 37.7* 31.6* 32.7*  MCV 98.7 98.4 98.5  PLT 229 213 189   Cardiac Enzymes: No results for input(s): CKTOTAL, CKMB, CKMBINDEX, TROPONINI in the last 168 hours. BNP: BNP (last 3 results) No results for input(s): BNP in the last 8760 hours.  ProBNP (last 3 results) No results for input(s): PROBNP in the last 8760 hours.  CBG: No results for input(s): GLUCAP in the last 168 hours.     Signed:  Lelon Frohlich  [facility] Pager: [phone] 08/23/2017, 2:44 PM

## 2017-08-23 NOTE — Progress Notes (Signed)
Patient being d/c home with family. Iv cath removed and intact. No c/o pain at this time or at site. Patient bathed and ambulated with staff around the room and tolerates well. D/C instructions given and patient verbalizes understanding.

## 2017-08-26 LAB — CULTURE, BLOOD (ROUTINE X 2)
CULTURE: NO GROWTH
Culture: NO GROWTH

## 2017-09-14 ENCOUNTER — Encounter (HOSPITAL_COMMUNITY): Payer: Self-pay | Admitting: *Deleted

## 2017-09-14 ENCOUNTER — Other Ambulatory Visit: Payer: Self-pay

## 2017-09-14 ENCOUNTER — Emergency Department (HOSPITAL_COMMUNITY)
Admission: EM | Admit: 2017-09-14 | Discharge: 2017-09-14 | Disposition: A | Discharge status: Home / Self Care | Payer: Medicare Other | Attending: Emergency Medicine | Admitting: Emergency Medicine

## 2017-09-14 DIAGNOSIS — K5900 Constipation, unspecified: Secondary | ICD-10-CM

## 2017-09-14 DIAGNOSIS — K649 Unspecified hemorrhoids: Secondary | ICD-10-CM | POA: Diagnosis not present

## 2017-09-14 MED ORDER — FLEET ENEMA 7-19 GM/118ML RE ENEM
1.0000 | ENEMA | Freq: Once | RECTAL | Status: AC
  Administered 2017-09-14: 1 via RECTAL

## 2017-09-14 NOTE — ED Triage Notes (Signed)
Pt states he is unable to have a BM and is c/o pain to rectum

## 2017-09-14 NOTE — ED Provider Notes (Signed)
[facility] EMERGENCY DEPARTMENT Provider Note   CSN: [id number] Arrival date & time: 09/14/17  1338     History   Chief Complaint Chief Complaint  Patient presents with  . Constipation    HPI Mario May is a 83 y.o. male.  HPI The pt is a 83 y/o male - denies a hx of abd pain recently but states that today he has not been able to have a BM but notes that he has severe rectal discomfort and was unable to have a BM - he even tried to disimpact himself at home unsuccessful - states that he and his wife live together by themselves - he had no meds prior to arrival.  Has not been passing gas and has no abd pain or n/v.  He has had constipation in the past.   Miralax is noted on his MAR.  He has not had this today.  The feeling of rectal discomfort is constant.  He reports normal stooling yesterday  Past Medical History:  Diagnosis Date  . Anemia   . Arthritis    "just my legs" (04/06/2013)  . Assistance needed for mobility    walks with walker  . Constipation   . Gout   . HOH (hard of hearing)   . Hyperlipidemia   . Hypertension   . Hypothyroidism   . Kidney stones    "only once" (04/06/2013)  . Lumbar foraminal stenosis 06/04/2016  . 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 Rheumatism   . SIRS (systemic inflammatory response syndrome) ([place]) 02/2013   [name] 03/19/2013  (04/06/2013)  . Small bowel obstruction [facility])     Patient Active Problem List   Diagnosis Date Noted  . CAP (community acquired pneumonia) 08/21/2017  . Neck pain 08/21/2017  . Bradycardia 01/23/2017  . Atrial fibrillation ([place]) 01/23/2017  . Dehydration 08/19/2016  . Elevated troponin 08/18/2016  . AKI (acute kidney injury) ([place]) 08/18/2016  . Difficulty in walking, not elsewhere classified   . Gait instability   . Left leg cellulitis 06/05/2016  . Orthostatic hypotension 06/04/2016  . 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 Lumbar foraminal stenosis 06/04/2016    . DJD (degenerative joint disease), multiple sites 06/04/2016  . Fever 06/04/2016  . Leukocytosis 06/04/2016  . Stasis dermatitis of both legs 07/01/2013  . Essential hypertension 03/15/2013  . Hypothyroidism, adult 03/18/2009  . CONSTIPATION 03/18/2009  . CHANGE IN BOWELS 03/18/2009    Past Surgical History:  Procedure Laterality Date  . CYSTOSCOPY W/ STONE MANIPULATION  ?1986  . NASAL FRACTURE SURGERY    . TRANSURETHRAL RESECTION OF PROSTATE         Home Medications    Prior to Admission medications   Medication Sig Start Date End Date Taking? Authorizing Provider  allopurinol (ZYLOPRIM) 100 MG tablet Take 100 mg by mouth daily.    [provider]  amLODipine (NORVASC) 5 MG tablet Take 5 mg by mouth daily. 01/31/17   [provider]  aspirin 325 MG tablet Take 325 mg by mouth daily.    [provider]  finasteride (PROSCAR) 5 MG tablet Take 5 mg by mouth daily.     [provider]  furosemide (LASIX) 20 MG tablet Take 1 tablet by mouth daily. 05/19/16   [provider]  isosorbide dinitrate (ISORDIL) 10 MG tablet Take 10 mg by mouth 2 (two) times daily.    [provider]  levothyroxine (SYNTHROID, LEVOTHROID) 50 MCG tablet Take 50 mcg by mouth daily  before breakfast.    [provider]  meclizine (ANTIVERT) 25 MG tablet Take 25 mg by mouth 3 (three) times daily as needed (inner ear problems).    [provider]  polyethylene glycol (MIRALAX / GLYCOLAX) packet Take 17 g by mouth daily.    [provider]  traMADol (ULTRAM) 50 MG tablet Take 1 tablet (50 mg total) by mouth every 6 (six) hours as needed for moderate pain. 01/23/17   Kathie Dike, MD    Family History Family History  Problem Relation Age of Onset  . Colon cancer Mother   . Prostate cancer Father   . Heart disease Father   . Colon polyps Brother        x 2  . Heart disease Brother     Social History Social History    Tobacco Use  . Smoking status: Former Smoker    Packs/day: 0.50    Types: Cigarettes    Last attempt to quit: 1960    Years since quitting: 59.0  . Smokeless tobacco: Never Used  Substance Use Topics  . Alcohol use: No    Comment: 04/06/2013 "last time I've had any alcohol was 09/12/1945; never had a problem w/it"  . Drug use: No     Allergies   Codeine; Grapefruit concentrate; Other; and Pineapple   Review of Systems Review of Systems  Constitutional: Negative for chills and fever.  HENT: Negative for sore throat.   Eyes: Negative for visual disturbance.  Respiratory: Negative for cough and shortness of breath.   Cardiovascular: Negative for chest pain.  Gastrointestinal: Positive for constipation. Negative for abdominal pain, diarrhea, nausea and vomiting.  Genitourinary: Negative for dysuria and frequency.  Musculoskeletal: Negative for back pain and neck pain.  Skin: Negative for rash.  Neurological: Negative for weakness, numbness and headaches.  Hematological: Negative for adenopathy.  Psychiatric/Behavioral: Negative for behavioral problems.     Physical Exam Updated Vital Signs BP 137/74 (BP Location: Left Arm)   Pulse 78   Temp (!) 97.5 F (36.4 C) (Oral)   Resp 20   SpO2 100%   Physical Exam  Constitutional: He appears well-developed and well-nourished. No distress.  HENT:  Head: Normocephalic and atraumatic.  Mouth/Throat: Oropharynx is clear and moist. No oropharyngeal exudate.  Eyes: Conjunctivae and EOM are normal. Pupils are equal, round, and reactive to light. Right eye exhibits no discharge. Left eye exhibits no discharge. No scleral icterus.  Neck: Normal range of motion. Neck supple. No JVD present. No thyromegaly present.  Cardiovascular: Normal rate, regular rhythm, normal heart sounds and intact distal pulses. Exam reveals no gallop and no friction rub.  No murmur heard. Pulmonary/Chest: Effort normal and breath sounds normal. No  respiratory distress. He has no wheezes. He has no rales.  Abdominal: Soft. Bowel sounds are normal. He exhibits no distension and no mass. There is no tenderness.  Very soft and non tender  Genitourinary:  Genitourinary Comments: Large tender nonthrombosed hemorrhoids which are nonbleeding are present in the perirectal area, rectum is full of hard stool  Musculoskeletal: Normal range of motion. He exhibits no edema or tenderness.  Lymphadenopathy:    He has no cervical adenopathy.  Neurological: He is alert. Coordination normal.  Skin: Skin is warm and dry. No rash noted. No erythema.  Psychiatric: He has a normal mood and affect. His behavior is normal.  Nursing note and vitals reviewed.    ED Treatments / Results  Labs (all labs ordered are listed, but only  abnormal results are displayed) Labs Reviewed - No data to display   Radiology No results found.  Procedures Procedures (including critical care time)  Medications Ordered in ED Medications  sodium phosphate (FLEET) 7-19 GM/118ML enema 1 enema (1 enema Rectal Given 09/14/17 1510)     Initial Impression / Assessment and Plan / ED Course  I have reviewed the triage vital signs and the nursing notes.  Pertinent labs & imaging results that were available during my care of the patient were reviewed by me and considered in my medical decision making (see chart for details).    The pt arrives with full rooms - will need to be  In hallway temporarily, needs rectal exam to r/o impaction.  Possible stool softners / laxatives or enema.  Non tender abdomen.  Patient partially disimpacted, will add on an enema, he has large hemorrhoids which may be contributing to his rectal pain.  Informed daughter who is at the bedside, she is in agreement with the workup and plan as is the patient  The patient tolerated the disimpaction, tolerated an enema and has had some more stool as well as some watery output, he feels much better, has very  little rectal discomfort and can be followed up in the outpatient setting, referred to gastroenterology for his hemorrhoids as well as chronic constipation and encouraged increased amounts of MiraLAX.  This was communicated both with the patient and the daughter who was there as a caregiver.  Final Clinical Impressions(s) / ED Diagnoses   Final diagnoses:  Constipation, unspecified constipation type  Hemorrhoids, unspecified hemorrhoid type    ED Discharge Orders    None       Noemi Chapel, MD 09/14/17 1709

## 2017-09-14 NOTE — Discharge Instructions (Signed)
Please take Miralax 3 times daily until you are having regular soft or loose stools Do not strain as this can make your hemorrhoids worse Avoid sitting on the hemorrhoids - you may need a donut pillow ER for increased pain or bleeding.

## 2017-10-05 ENCOUNTER — Emergency Department (HOSPITAL_COMMUNITY)
Admission: EM | Admit: 2017-10-05 | Discharge: 2017-10-06 | Disposition: A | Discharge status: Home / Self Care | Payer: Medicare Other | Attending: Emergency Medicine | Admitting: Emergency Medicine

## 2017-10-05 ENCOUNTER — Encounter (HOSPITAL_COMMUNITY): Payer: Self-pay | Admitting: *Deleted

## 2017-10-05 ENCOUNTER — Emergency Department (HOSPITAL_COMMUNITY): Payer: Medicare Other

## 2017-10-05 DIAGNOSIS — R1013 Epigastric pain: Secondary | ICD-10-CM | POA: Diagnosis not present

## 2017-10-05 DIAGNOSIS — I482 Chronic atrial fibrillation, unspecified: Secondary | ICD-10-CM

## 2017-10-05 DIAGNOSIS — K8689 Other specified diseases of pancreas: Secondary | ICD-10-CM

## 2017-10-05 DIAGNOSIS — D649 Anemia, unspecified: Secondary | ICD-10-CM | POA: Diagnosis not present

## 2017-10-05 DIAGNOSIS — Z79899 Other long term (current) drug therapy: Secondary | ICD-10-CM | POA: Diagnosis not present

## 2017-10-05 DIAGNOSIS — Z87891 Personal history of nicotine dependence: Secondary | ICD-10-CM | POA: Diagnosis not present

## 2017-10-05 DIAGNOSIS — K869 Disease of pancreas, unspecified: Secondary | ICD-10-CM | POA: Diagnosis not present

## 2017-10-05 DIAGNOSIS — N189 Chronic kidney disease, unspecified: Secondary | ICD-10-CM | POA: Insufficient documentation

## 2017-10-05 DIAGNOSIS — R748 Abnormal levels of other serum enzymes: Secondary | ICD-10-CM | POA: Diagnosis not present

## 2017-10-05 DIAGNOSIS — N289 Disorder of kidney and ureter, unspecified: Secondary | ICD-10-CM

## 2017-10-05 DIAGNOSIS — R55 Syncope and collapse: Secondary | ICD-10-CM | POA: Diagnosis present

## 2017-10-05 LAB — BASIC METABOLIC PANEL
Anion gap: 16 — ABNORMAL HIGH (ref 5–15)
BUN: 31 mg/dL — AB (ref 6–20)
CHLORIDE: 97 mmol/L — AB (ref 101–111)
CO2: 20 mmol/L — AB (ref 22–32)
CREATININE: 1.46 mg/dL — AB (ref 0.61–1.24)
Calcium: 9 mg/dL (ref 8.9–10.3)
GFR calc Af Amer: 44 mL/min — ABNORMAL LOW (ref >60)
GFR calc non Af Amer: 38 mL/min — ABNORMAL LOW (ref >60)
GLUCOSE: 118 mg/dL — AB (ref 65–99)
Potassium: 3.5 mmol/L (ref 3.5–5.1)
Sodium: 133 mmol/L — ABNORMAL LOW (ref 135–145)

## 2017-10-05 LAB — CBC
HCT: 39.5 % (ref 39.0–52.0)
Hemoglobin: 12.7 g/dL — ABNORMAL LOW (ref 13.0–17.0)
MCH: 30.7 pg (ref 26.0–34.0)
MCHC: 32.2 g/dL (ref 30.0–36.0)
MCV: 95.4 fL (ref 78.0–100.0)
Platelets: 246 10*3/uL (ref 150–400)
RBC: 4.14 MIL/uL — AB (ref 4.22–5.81)
RDW: 14.2 % (ref 11.5–15.5)
WBC: 7.2 10*3/uL (ref 4.0–10.5)

## 2017-10-05 LAB — HEPATIC FUNCTION PANEL
ALBUMIN: 3.8 g/dL (ref 3.5–5.0)
ALK PHOS: 178 U/L — AB (ref 38–126)
ALT: 13 U/L — ABNORMAL LOW (ref 17–63)
AST: 39 U/L (ref 15–41)
BILIRUBIN DIRECT: 0.2 mg/dL (ref 0.1–0.5)
BILIRUBIN TOTAL: 0.9 mg/dL (ref 0.3–1.2)
Indirect Bilirubin: 0.7 mg/dL (ref 0.3–0.9)
Total Protein: 8.1 g/dL (ref 6.5–8.1)

## 2017-10-05 LAB — I-STAT TROPONIN, ED: TROPONIN I, POC: 0.01 ng/mL (ref 0.00–0.08)

## 2017-10-05 LAB — LIPASE, BLOOD: LIPASE: 25 U/L (ref 11–51)

## 2017-10-05 MED ORDER — IOPAMIDOL (ISOVUE-300) INJECTION 61%
75.0000 mL | Freq: Once | INTRAVENOUS | Status: AC | PRN
  Administered 2017-10-05: 75 mL via INTRAVENOUS

## 2017-10-05 NOTE — ED Notes (Signed)
Daughter states that pt was attempting to have a BM and has been having some bloating and constipation.  She states that he reported CP while on the toilet and so his wife gave him one of her nitro tablets.  Pt then felt faint and had a syncopal episode after leaving the toilet.  Pt also had some vomiting after he came to.  Pt did not fall, he was lowered to the ground by his daughter.

## 2017-10-05 NOTE — ED Provider Notes (Signed)
[facility] EMERGENCY DEPARTMENT Provider Note   CSN: [id number] Arrival date & time: 10/05/17  2210     History   Chief Complaint Chief Complaint  Patient presents with  . Loss of Consciousness    HPI Mario May is a 84 y.o. male.  The history is provided by the patient and a relative.  He has history of hypertension, hyperlipidemia, persistent atrial fibrillation and comes in because of upper abdominal pain.  For the last 2 days, he has had pain in his epigastric area and a sense that he had to belch, but was unable to do so.  Tonight, he did vomit once.  He has been having chronic problems with constipation.  He is unable to put a number on his pain.  Tonight, while trying to have a bowel movement, his wife gave him a dose of her nitroglycerin and he had a brief syncopal episode.  He denies chest pain, heaviness, tightness, pressure.  There is no dyspnea.  Daughter is concerned that he may be having acid reflux.  Past Medical History:  Diagnosis Date  . Anemia   . Arthritis    "just my legs" (04/06/2013)  . Assistance needed for mobility    walks with walker  . Constipation   . Gout   . HOH (hard of hearing)   . Hyperlipidemia   . Hypertension   . Hypothyroidism   . Kidney stones    "only once" (04/06/2013)  . Lumbar foraminal stenosis 06/04/2016  . 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 Rheumatism   . SIRS (systemic inflammatory response syndrome) ([place]) 02/2013   [name] 03/19/2013  (04/06/2013)  . Small bowel obstruction [facility])     Patient Active Problem List   Diagnosis Date Noted  . CAP (community acquired pneumonia) 08/21/2017  . Neck pain 08/21/2017  . Bradycardia 01/23/2017  . Atrial fibrillation ([place]) 01/23/2017  . Dehydration 08/19/2016  . Elevated troponin 08/18/2016  . AKI (acute kidney injury) ([place]) 08/18/2016  . Difficulty in walking, not elsewhere classified   . Gait instability   . Left leg cellulitis 06/05/2016  . Orthostatic  hypotension 06/04/2016  . 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 Lumbar foraminal stenosis 06/04/2016  . DJD (degenerative joint disease), multiple sites 06/04/2016  . Fever 06/04/2016  . Leukocytosis 06/04/2016  . Stasis dermatitis of both legs 07/01/2013  . Essential hypertension 03/15/2013  . Hypothyroidism, adult 03/18/2009  . CONSTIPATION 03/18/2009  . CHANGE IN BOWELS 03/18/2009    Past Surgical History:  Procedure Laterality Date  . CYSTOSCOPY W/ STONE MANIPULATION  ?1986  . NASAL FRACTURE SURGERY    . TRANSURETHRAL RESECTION OF PROSTATE         Home Medications    Prior to Admission medications   Medication Sig Start Date End Date Taking? Authorizing Provider  allopurinol (ZYLOPRIM) 100 MG tablet Take 100 mg by mouth daily.    [provider]  amLODipine (NORVASC) 5 MG tablet Take 5 mg by mouth daily. 01/31/17   [provider]  aspirin 325 MG tablet Take 325 mg by mouth daily.    [provider]  finasteride (PROSCAR) 5 MG tablet Take 5 mg by mouth daily.     [provider]  furosemide (LASIX) 20 MG tablet Take 1 tablet by mouth daily. 05/19/16   [provider]  isosorbide dinitrate (ISORDIL) 10 MG tablet Take 10 mg by mouth 2 (two) times daily.    [provider]  levothyroxine (SYNTHROID, [place])  50 MCG tablet Take 50 mcg by mouth daily before breakfast.    [provider]  meclizine (ANTIVERT) 25 MG tablet Take 25 mg by mouth 3 (three) times daily as needed (inner ear problems).    [provider]  polyethylene glycol (MIRALAX / GLYCOLAX) packet Take 17 g by mouth daily.    [provider]  traMADol (ULTRAM) 50 MG tablet Take 1 tablet (50 mg total) by mouth every 6 (six) hours as needed for moderate pain. 01/23/17   Kathie Dike, MD    Family History Family History  Problem Relation Age of Onset  . Colon cancer Mother   . Prostate cancer Father   . Heart  disease Father   . Colon polyps Brother        x 2  . Heart disease Brother     Social History Social History   Tobacco Use  . Smoking status: Former Smoker    Packs/day: 0.50    Types: Cigarettes    Last attempt to quit: 1960    Years since quitting: 59.0  . Smokeless tobacco: Never Used  Substance Use Topics  . Alcohol use: No    Comment: 04/06/2013 "last time I've had any alcohol was 09/12/1945; never had a problem w/it"  . Drug use: No     Allergies   Codeine; Grapefruit concentrate; Other; and Pineapple   Review of Systems Review of Systems  All other systems reviewed and are negative.    Physical Exam Updated Vital Signs BP 134/77   Pulse (!) 40   Temp (!) 97.4 F (36.3 C) (Oral)   Resp 19   Wt 74.8 kg (165 lb)   SpO2 100%   BMI 23.01 kg/m   Physical Exam  Nursing note and vitals reviewed.  84 year old male, resting comfortably and in no acute distress. Vital signs are significant for bradycardia. Oxygen saturation is 100%, which is normal. Head is normocephalic and atraumatic. PERRLA, EOMI. Oropharynx is clear. Neck is nontender and supple without adenopathy or JVD. Back is nontender and there is no CVA tenderness. Lungs are clear without rales, wheezes, or rhonchi. Chest is nontender. Heart has an irregular rhythm without murmur. Abdomen is soft, flat, with mild epigastric tenderness.  There is no rebound or guarding.  There are no masses or hepatosplenomegaly and peristalsis is normoactive. Extremities have no cyanosis or edema, full range of motion is present. Skin is warm and dry without rash. Neurologic: Mental status is normal, cranial nerves are intact, there are no motor or sensory deficits.  ED Treatments / Results  Labs (all labs ordered are listed, but only abnormal results are displayed) Labs Reviewed  BASIC METABOLIC PANEL - Abnormal; Notable for the following components:      Result Value   Sodium 133 (*)    Chloride 97 (*)     CO2 20 (*)    Glucose, Bld 118 (*)    BUN 31 (*)    Creatinine, Ser 1.46 (*)    GFR calc non Af Amer 38 (*)    GFR calc Af Amer 44 (*)    Anion gap 16 (*)    All other components within normal limits  CBC - Abnormal; Notable for the following components:   RBC 4.14 (*)    Hemoglobin 12.7 (*)    All other components within normal limits  URINALYSIS, ROUTINE W REFLEX MICROSCOPIC - Abnormal; Notable for the following components:   Leukocytes, UA SMALL (*)  Squamous Epithelial / LPF 0-5 (*)    All other components within normal limits  HEPATIC FUNCTION PANEL - Abnormal; Notable for the following components:   ALT 13 (*)    Alkaline Phosphatase 178 (*)    All other components within normal limits  LIPASE, BLOOD  CBG MONITORING, ED  I-STAT TROPONIN, ED    EKG  EKG Interpretation  Date/Time:  Wednesday October 05 2017 22:30:33 EST Ventricular Rate:  70 PR Interval:    QRS Duration: 110 QT Interval:  446 QTC Calculation: 403 R Axis:   65 Text Interpretation:  Atrial fibrillation RSR' in V1 or V2, right VCD or RVH Repol abnrm, severe global ischemia (LM/MVD) Artifact in lead(s) I II III aVR aVL aVF V1 When compared with ECG of 08/21/2017, HEART RATE has decreased Confirmed by Delora Fuel ([id number]) on 10/05/2017 10:58:34 PM       Radiology Dg Chest 2 View  Result Date: 10/05/2017 CLINICAL DATA:  Chest pain and syncope EXAM: CHEST  2 VIEW COMPARISON:  08/21/2017 FINDINGS: Improved aeration of the left lung base. Minimal streaky atelectasis in the region. No pleural effusion. Borderline cardiomegaly with aortic atherosclerosis. No pneumothorax. Mild degenerative changes of the spine. IMPRESSION: Clearing of previously noted left lower lobe infiltrate with minimal atelectasis or scarring in the region. Electronically Signed   By: Donavan Foil M.D.   On: 10/05/2017 23:22   Ct Abdomen Pelvis W Contrast  Result Date: 10/06/2017 CLINICAL DATA:  One 84 year old male with abdominal  pain. EXAM: CT ABDOMEN AND PELVIS WITH CONTRAST TECHNIQUE: Multidetector CT imaging of the abdomen and pelvis was performed using the standard protocol following bolus administration of intravenous contrast. CONTRAST:  49mL ISOVUE-300 IOPAMIDOL (ISOVUE-300) INJECTION 61% COMPARISON:  Abdominal CT dated [id number] an MRI dated 01/19/2017 FINDINGS: Lower chest: There are bibasilar atelectasis/scarring. Mild cardiomegaly with multi vessel coronary vascular calcification. No intra-free air or free fluid. Hepatobiliary: The liver is unremarkable. No intrahepatic biliary ductal dilatation. The gallbladder is distended but otherwise unremarkable. Pancreas: Ill-defined hypoenhancing mass inferior to the uncinate process of the pancreas measuring approximately 2.5 x 1.8 cm grossly unchanged compared to the prior CT given difference in measurement and imaging technique between the studies. Follow-up as recommended on the prior MRI. There is no dilatation of the main pancreatic duct. No associated inflammatory changes. Spleen: Normal in size without focal abnormality. Adrenals/Urinary Tract: Bilateral renal cysts. There is no hydronephrosis on either side. There is symmetric enhancement and excretion of contrast by both kidneys. The visualized ureters and urinary bladder appear unremarkable. Stomach/Bowel: The stomach is distended with gastric content. No evidence of gastric outlet obstruction. There is moderate stool throughout the colon. No bowel obstruction. There is redundancy of the sigmoid colon. The sigmoid colon appears to extend through a mesenteric defect into the right hemiabdomen similar to prior CT. There is no evidence of obstruction, twisting, or inflammatory changes. The appendix is not visualized with certainty. No inflammatory changes identified in the right lower quadrant. Vascular/Lymphatic: There is advanced aortoiliac atherosclerotic disease. The SMV, splenic vein, and main portal vein are patent. No portal  venous gas. There is no adenopathy. Reproductive: Postsurgical changes of TURP. Other: None Musculoskeletal: Osteopenia with degenerative changes of the spine. Bilateral L5 pars defects with grade 1 L5-S1 anterolisthesis. No acute fracture. IMPRESSION: 1. No acute intra-abdominal or pelvic pathology. No bowel obstruction or active inflammation. 2. Mildly distended stomach. No evidence of gastric outlet obstruction. 3. Mildly distended gallbladder. No calcified stone or inflammatory changes.  4. Ill-defined soft tissue mass arising from the inferior uncinate process of the pancreas as seen on the prior CT and MRI. Follow-up as previously recommended. 5.  Aortic Atherosclerosis (ICD10-I70.0). Electronically Signed   By: Anner Crete M.D.   On: 10/06/2017 00:13    Procedures Procedures (including critical care time)  Medications Ordered in ED Medications - No data to display   Initial Impression / Assessment and Plan / ED Course  I have reviewed the triage vital signs and the nursing notes.  Pertinent labs & imaging results that were available during my care of the patient were reviewed by me and considered in my medical decision making (see chart for details).  Syncopal episode likely due to transient hypotension from nitroglycerin.  ECG shows no acute changes, and I am not inclined to pursue this any further.  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  It was felt to be slow growing.  We will check hepatic functions, lipase and sent for CT of abdomen and pelvis.  CT shows no change in the pancreatic mass, no acute findings.  Laboratory workup shows stable anemia and stable elevation of alkaline phosphatase.  Renal function is slightly worse than previous.  No clear cause for his pain.  We will treat empirically with proton pump inhibitor.  He is discharged with  prescriptions for pantoprazole and ondansetron, follow-up with PCP.  CHA2DS2/VAS Stroke Risk Points      4 >= 2 Points: High Risk  1 - 1.99 Points: Medium Risk  0 Points: Low Risk    The patient's score has not changed in the past year.:  No Change     Details    This score determines the patient's risk of having a stroke if the  patient has atrial fibrillation.       Points Metrics  0 Has Congestive Heart Failure:  No   1 Has Vascular Disease:  Yes    1 Has Hypertension:  Yes   2 Age:  89   0 Has Diabetes:  No   0 Had Stroke:  No  Had TIA:  No  Had thromboembolism:  No   0 Male:  No      Final Clinical Impressions(s) / ED Diagnoses   Final diagnoses:  Epigastric pain  Renal insufficiency  Normochromic normocytic anemia  Elevated serum alkaline phosphatase level  Pancreatic mass  Chronic atrial fibrillation [facility])    ED Discharge Orders        Ordered    pantoprazole (PROTONIX) 40 MG tablet  Daily     10/06/17 0207    ondansetron (ZOFRAN) 4 MG tablet  Every 6 hours PRN     85/02/77 4128       Delora Fuel, MD 78/67/67 0210

## 2017-10-05 NOTE — ED Triage Notes (Signed)
Pt lives at home with his family.  He felt like he needed to have a BM but was unsuccessful.  While leaving the bathroom pt had a syncopal episode.  Pt did not fall, no injuries.  Pt has had some CP today and is currently nauseated.  Abdomen is soft but more distended than usual (ems states that his wife told them this).  Pt is alert and oriented

## 2017-10-06 LAB — URINALYSIS, ROUTINE W REFLEX MICROSCOPIC
BACTERIA UA: NONE SEEN
Bilirubin Urine: NEGATIVE
GLUCOSE, UA: NEGATIVE mg/dL
HGB URINE DIPSTICK: NEGATIVE
KETONES UR: NEGATIVE mg/dL
NITRITE: NEGATIVE
PH: 5 (ref 5.0–8.0)
PROTEIN: NEGATIVE mg/dL
Specific Gravity, Urine: 1.018 (ref 1.005–1.030)

## 2017-10-06 LAB — CBG MONITORING, ED: Glucose-Capillary: 99 mg/dL (ref 65–99)

## 2017-10-06 MED ORDER — ONDANSETRON HCL 4 MG PO TABS: 4.0000 mg | ORAL_TABLET | Freq: Four times a day (QID) | ORAL | 0 refills | Status: DC | PRN

## 2017-10-06 MED ORDER — PANTOPRAZOLE SODIUM 40 MG PO TBEC
40.0000 mg | DELAYED_RELEASE_TABLET | Freq: Once | ORAL | Status: AC
  Administered 2017-10-06: 40 mg via ORAL
  Filled 2017-10-06: qty 1

## 2017-10-06 MED ORDER — PANTOPRAZOLE SODIUM 40 MG PO TBEC: 40.0000 mg | DELAYED_RELEASE_TABLET | Freq: Every day | ORAL | 0 refills | Status: DC

## 2017-10-06 NOTE — Discharge Instructions (Signed)
Encourage fluids.  Return if symptoms are getting worse.

## 2017-10-28 ENCOUNTER — Inpatient Hospital Stay (HOSPITAL_COMMUNITY): Payer: Medicare Other

## 2017-10-28 ENCOUNTER — Ambulatory Visit: Payer: Medicare Other | Admitting: Internal Medicine

## 2017-10-28 ENCOUNTER — Emergency Department (HOSPITAL_COMMUNITY): Payer: Medicare Other

## 2017-10-28 ENCOUNTER — Encounter (HOSPITAL_COMMUNITY): Payer: Self-pay

## 2017-10-28 ENCOUNTER — Inpatient Hospital Stay (HOSPITAL_COMMUNITY)
Admission: EM | Admit: 2017-10-28 | Discharge: 2017-10-31 | DRG: 062 | Disposition: A | Discharge status: Skilled Nursing Facility | Payer: Medicare Other | Attending: Neurology | Admitting: Neurology

## 2017-10-28 ENCOUNTER — Encounter (INDEPENDENT_AMBULATORY_CARE_PROVIDER_SITE_OTHER): Payer: Self-pay

## 2017-10-28 ENCOUNTER — Encounter: Payer: Self-pay | Admitting: Internal Medicine

## 2017-10-28 ENCOUNTER — Other Ambulatory Visit: Payer: Self-pay

## 2017-10-28 VITALS — BP 120/70 | HR 70 | Ht 71.0 in | Wt 155.0 lb

## 2017-10-28 DIAGNOSIS — K5909 Other constipation: Secondary | ICD-10-CM | POA: Diagnosis not present

## 2017-10-28 DIAGNOSIS — Z79890 Hormone replacement therapy (postmenopausal): Secondary | ICD-10-CM | POA: Diagnosis not present

## 2017-10-28 DIAGNOSIS — R4701 Aphasia: Secondary | ICD-10-CM | POA: Diagnosis present

## 2017-10-28 DIAGNOSIS — Z79899 Other long term (current) drug therapy: Secondary | ICD-10-CM | POA: Diagnosis not present

## 2017-10-28 DIAGNOSIS — I63412 Cerebral infarction due to embolism of left middle cerebral artery: Secondary | ICD-10-CM | POA: Diagnosis not present

## 2017-10-28 DIAGNOSIS — R29810 Facial weakness: Secondary | ICD-10-CM | POA: Diagnosis present

## 2017-10-28 DIAGNOSIS — Z91018 Allergy to other foods: Secondary | ICD-10-CM | POA: Diagnosis not present

## 2017-10-28 DIAGNOSIS — Z87891 Personal history of nicotine dependence: Secondary | ICD-10-CM

## 2017-10-28 DIAGNOSIS — R29705 NIHSS score 5: Secondary | ICD-10-CM | POA: Diagnosis present

## 2017-10-28 DIAGNOSIS — H9190 Unspecified hearing loss, unspecified ear: Secondary | ICD-10-CM | POA: Diagnosis present

## 2017-10-28 DIAGNOSIS — I10 Essential (primary) hypertension: Secondary | ICD-10-CM | POA: Diagnosis not present

## 2017-10-28 DIAGNOSIS — N189 Chronic kidney disease, unspecified: Secondary | ICD-10-CM | POA: Diagnosis present

## 2017-10-28 DIAGNOSIS — Z7982 Long term (current) use of aspirin: Secondary | ICD-10-CM

## 2017-10-28 DIAGNOSIS — Z885 Allergy status to narcotic agent status: Secondary | ICD-10-CM

## 2017-10-28 DIAGNOSIS — I130 Hypertensive heart and chronic kidney disease with heart failure and stage 1 through stage 4 chronic kidney disease, or unspecified chronic kidney disease: Secondary | ICD-10-CM | POA: Diagnosis present

## 2017-10-28 DIAGNOSIS — F40240 Claustrophobia: Secondary | ICD-10-CM | POA: Diagnosis present

## 2017-10-28 DIAGNOSIS — I63011 Cerebral infarction due to thrombosis of right vertebral artery: Secondary | ICD-10-CM

## 2017-10-28 DIAGNOSIS — E876 Hypokalemia: Secondary | ICD-10-CM | POA: Diagnosis present

## 2017-10-28 DIAGNOSIS — R140 Abdominal distension (gaseous): Secondary | ICD-10-CM | POA: Diagnosis not present

## 2017-10-28 DIAGNOSIS — G8191 Hemiplegia, unspecified affecting right dominant side: Secondary | ICD-10-CM | POA: Diagnosis present

## 2017-10-28 DIAGNOSIS — I481 Persistent atrial fibrillation: Secondary | ICD-10-CM | POA: Diagnosis present

## 2017-10-28 DIAGNOSIS — R001 Bradycardia, unspecified: Secondary | ICD-10-CM | POA: Diagnosis not present

## 2017-10-28 DIAGNOSIS — E7800 Pure hypercholesterolemia, unspecified: Secondary | ICD-10-CM

## 2017-10-28 DIAGNOSIS — I4891 Unspecified atrial fibrillation: Secondary | ICD-10-CM | POA: Diagnosis not present

## 2017-10-28 DIAGNOSIS — E785 Hyperlipidemia, unspecified: Secondary | ICD-10-CM | POA: Diagnosis present

## 2017-10-28 DIAGNOSIS — E039 Hypothyroidism, unspecified: Secondary | ICD-10-CM | POA: Diagnosis present

## 2017-10-28 DIAGNOSIS — R29818 Other symptoms and signs involving the nervous system: Secondary | ICD-10-CM | POA: Diagnosis not present

## 2017-10-28 DIAGNOSIS — I63211 Cerebral infarction due to unspecified occlusion or stenosis of right vertebral arteries: Principal | ICD-10-CM | POA: Diagnosis present

## 2017-10-28 DIAGNOSIS — R634 Abnormal weight loss: Secondary | ICD-10-CM

## 2017-10-28 DIAGNOSIS — I639 Cerebral infarction, unspecified: Secondary | ICD-10-CM | POA: Diagnosis present

## 2017-10-28 DIAGNOSIS — I5032 Chronic diastolic (congestive) heart failure: Secondary | ICD-10-CM | POA: Diagnosis present

## 2017-10-28 DIAGNOSIS — I6300 Cerebral infarction due to thrombosis of unspecified precerebral artery: Secondary | ICD-10-CM | POA: Diagnosis not present

## 2017-10-28 HISTORY — DX: Unspecified atrial fibrillation: I48.91

## 2017-10-28 LAB — DIFFERENTIAL
BASOS PCT: 0 %
Basophils Absolute: 0 10*3/uL (ref 0.0–0.1)
Eosinophils Absolute: 0 10*3/uL (ref 0.0–0.7)
Eosinophils Relative: 0 %
Lymphocytes Relative: 33 %
Lymphs Abs: 2.2 10*3/uL (ref 0.7–4.0)
MONO ABS: 0.5 10*3/uL (ref 0.1–1.0)
MONOS PCT: 7 %
NEUTROS ABS: 4.1 10*3/uL (ref 1.7–7.7)
Neutrophils Relative %: 60 %

## 2017-10-28 LAB — COMPREHENSIVE METABOLIC PANEL
ALT: 10 U/L — AB (ref 17–63)
AST: 29 U/L (ref 15–41)
Albumin: 3.6 g/dL (ref 3.5–5.0)
Alkaline Phosphatase: 128 U/L — ABNORMAL HIGH (ref 38–126)
Anion gap: 17 — ABNORMAL HIGH (ref 5–15)
BUN: 31 mg/dL — ABNORMAL HIGH (ref 6–20)
CHLORIDE: 99 mmol/L — AB (ref 101–111)
CO2: 20 mmol/L — ABNORMAL LOW (ref 22–32)
CREATININE: 1.58 mg/dL — AB (ref 0.61–1.24)
Calcium: 9.2 mg/dL (ref 8.9–10.3)
GFR, EST AFRICAN AMERICAN: 40 mL/min — AB (ref >60)
GFR, EST NON AFRICAN AMERICAN: 34 mL/min — AB (ref >60)
Glucose, Bld: 100 mg/dL — ABNORMAL HIGH (ref 65–99)
Potassium: 3.8 mmol/L (ref 3.5–5.1)
Sodium: 136 mmol/L (ref 135–145)
Total Bilirubin: 1.4 mg/dL — ABNORMAL HIGH (ref 0.3–1.2)
Total Protein: 7 g/dL (ref 6.5–8.1)

## 2017-10-28 LAB — CBC
HEMATOCRIT: 38 % — AB (ref 39.0–52.0)
Hemoglobin: 12.4 g/dL — ABNORMAL LOW (ref 13.0–17.0)
MCH: 31.6 pg (ref 26.0–34.0)
MCHC: 32.6 g/dL (ref 30.0–36.0)
MCV: 96.9 fL (ref 78.0–100.0)
Platelets: 183 10*3/uL (ref 150–400)
RBC: 3.92 MIL/uL — ABNORMAL LOW (ref 4.22–5.81)
RDW: 14.5 % (ref 11.5–15.5)
WBC: 6.9 10*3/uL (ref 4.0–10.5)

## 2017-10-28 LAB — APTT: aPTT: 28 seconds (ref 24–36)

## 2017-10-28 LAB — I-STAT CHEM 8, ED
BUN: 41 mg/dL — AB (ref 6–20)
CHLORIDE: 99 mmol/L — AB (ref 101–111)
Calcium, Ion: 1.03 mmol/L — ABNORMAL LOW (ref 1.15–1.40)
Creatinine, Ser: 1.5 mg/dL — ABNORMAL HIGH (ref 0.61–1.24)
Glucose, Bld: 103 mg/dL — ABNORMAL HIGH (ref 65–99)
HEMATOCRIT: 40 % (ref 39.0–52.0)
Hemoglobin: 13.6 g/dL (ref 13.0–17.0)
POTASSIUM: 4.1 mmol/L (ref 3.5–5.1)
SODIUM: 136 mmol/L (ref 135–145)
TCO2: 27 mmol/L (ref 22–32)

## 2017-10-28 LAB — I-STAT TROPONIN, ED: Troponin i, poc: 0.02 ng/mL (ref 0.00–0.08)

## 2017-10-28 LAB — PROTIME-INR
INR: 1.07
Prothrombin Time: 13.8 seconds (ref 11.4–15.2)

## 2017-10-28 LAB — MRSA PCR SCREENING: MRSA BY PCR: NEGATIVE

## 2017-10-28 LAB — CBG MONITORING, ED: Glucose-Capillary: 92 mg/dL (ref 65–99)

## 2017-10-28 MED ORDER — FINASTERIDE 5 MG PO TABS
5.0000 mg | ORAL_TABLET | Freq: Every day | ORAL | Status: DC
  Administered 2017-10-30 – 2017-10-31 (×2): 5 mg via ORAL
  Filled 2017-10-28 (×3): qty 1

## 2017-10-28 MED ORDER — ALTEPLASE (STROKE) FULL DOSE INFUSION: 0.9000 mg/kg | Freq: Once | INTRAVENOUS | Status: DC

## 2017-10-28 MED ORDER — ACETAMINOPHEN 325 MG PO TABS
650.0000 mg | ORAL_TABLET | ORAL | Status: DC | PRN
  Administered 2017-10-29 – 2017-10-31 (×2): 650 mg via ORAL
  Filled 2017-10-28 (×2): qty 2

## 2017-10-28 MED ORDER — SODIUM CHLORIDE 0.9 % IV SOLN: 50.0000 mL | Freq: Once | INTRAVENOUS | Status: DC

## 2017-10-28 MED ORDER — SODIUM CHLORIDE 0.9 % IV SOLN
50.0000 mL | Freq: Once | INTRAVENOUS | Status: AC
  Administered 2017-10-28: 50 mL via INTRAVENOUS

## 2017-10-28 MED ORDER — IOPAMIDOL (ISOVUE-370) INJECTION 76%
INTRAVENOUS | Status: AC
  Filled 2017-10-28: qty 50

## 2017-10-28 MED ORDER — SODIUM CHLORIDE 0.9 % IV SOLN
INTRAVENOUS | Status: DC
  Administered 2017-10-28 – 2017-10-31 (×5): via INTRAVENOUS

## 2017-10-28 MED ORDER — ALLOPURINOL 100 MG PO TABS
100.0000 mg | ORAL_TABLET | Freq: Every day | ORAL | Status: DC
Start: 2017-10-29 — End: 2017-11-01
  Administered 2017-10-30 – 2017-10-31 (×2): 100 mg via ORAL
  Filled 2017-10-28 (×3): qty 1

## 2017-10-28 MED ORDER — ALTEPLASE (STROKE) FULL DOSE INFUSION
0.9000 mg/kg | Freq: Once | INTRAVENOUS | Status: AC
  Administered 2017-10-28: 64 mg via INTRAVENOUS
  Filled 2017-10-28: qty 100

## 2017-10-28 MED ORDER — PANTOPRAZOLE SODIUM 40 MG IV SOLR: 40.0000 mg | Freq: Every day | INTRAVENOUS | Status: DC

## 2017-10-28 MED ORDER — ISOSORBIDE DINITRATE 10 MG PO TABS
10.0000 mg | ORAL_TABLET | Freq: Two times a day (BID) | ORAL | Status: DC
  Administered 2017-10-28 – 2017-10-31 (×6): 10 mg via ORAL
  Filled 2017-10-28 (×8): qty 1

## 2017-10-28 MED ORDER — ACETAMINOPHEN 160 MG/5ML PO SOLN: 650.0000 mg | ORAL | Status: DC | PRN

## 2017-10-28 MED ORDER — RESOURCE THICKENUP CLEAR PO POWD
ORAL | Status: DC | PRN
  Filled 2017-10-28: qty 125

## 2017-10-28 MED ORDER — LEVOTHYROXINE SODIUM 50 MCG PO TABS
50.0000 ug | ORAL_TABLET | Freq: Every day | ORAL | Status: DC
  Administered 2017-10-29 – 2017-10-31 (×3): 50 ug via ORAL
  Filled 2017-10-28 (×3): qty 1

## 2017-10-28 MED ORDER — NICARDIPINE HCL IN NACL 20-0.86 MG/200ML-% IV SOLN
0.0000 mg/h | INTRAVENOUS | Status: DC
  Administered 2017-10-28: 5 mg/h via INTRAVENOUS

## 2017-10-28 MED ORDER — STROKE: EARLY STAGES OF RECOVERY BOOK
Freq: Once | Status: DC
  Filled 2017-10-28: qty 1

## 2017-10-28 MED ORDER — ACETAMINOPHEN 650 MG RE SUPP: 650.0000 mg | RECTAL | Status: DC | PRN

## 2017-10-28 MED ORDER — PANTOPRAZOLE SODIUM 40 MG PO TBEC
40.0000 mg | DELAYED_RELEASE_TABLET | Freq: Every day | ORAL | Status: DC
  Administered 2017-10-29 – 2017-10-31 (×3): 40 mg via ORAL
  Filled 2017-10-28 (×3): qty 1

## 2017-10-28 NOTE — ED Notes (Signed)
Cardene drip still paused at this time per neurology verbal order

## 2017-10-28 NOTE — Code Documentation (Addendum)
84yo male arriving to [facility] via [employer] at 53.  Patient was at the GI office with his daughter when he developed sudden difficulty speaking and right facial droop.  EMS was called and activated a code stroke.  Stroke team to the bedside.  Patient to CT with team.  NIHSS initially 1 for right facial droop.  See documentation for code stroke times.  CT completed.  Patient back to C24.  Patient reassessed and patient with worsening facial droop and now with expressive aphasia and some difficulty following commands. NIHSS now 5. Pharmacy notified and order to mix tPA.  BP assessed and within tPA administration parameters.  6mg  tPA bolus administered over 1 minute at 1218 followed by 58mg /hr for a total of 64mg  per pharmacy dosing.  Cardene gtt started for SBP>180.  Patient with worsening aphasia and no longer able to follow commands.  Order for CTA head and neck, patient transported to CT for imaging.  Cardene stopped while in CT.  Patient back to the ED.  Patient is not a candidate for endovascular treatment.  Patient with increasing agitation and began grabbing his left lower head/neck.  Dr. Leonel Ramsay notified and order for STAT CT.  Patient to CT and CT completed.  Patient back to the ED.  Patient transported to 4N15 by ED RN and Stroke RN.  Bedside handoff with 4N RN Nira Conn.

## 2017-10-28 NOTE — Evaluation (Signed)
Clinical/Bedside Swallow Evaluation Patient Details  Name: Mario May MRN: [id number] Date of Birth: 01/03/1915  Today's Date: 10/28/2017 Time: SLP Start Time (ACUTE ONLY): 1600 SLP Stop Time (ACUTE ONLY): 1620 SLP Time Calculation (min) (ACUTE ONLY): 20 min  Past Medical History:  Past Medical History:  Diagnosis Date  . Anemia   . Arthritis    "just my legs" (04/06/2013)  . Assistance needed for mobility    walks with walker  . Atrial fibrillation ([place])   . Constipation   . Gout   . HOH (hard of hearing)   . Hyperlipidemia   . Hypertension   . Hypothyroidism   . Kidney stones    "only once" (04/06/2013)  . Lumbar foraminal stenosis 06/04/2016  . 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 Rheumatism   . SIRS (systemic inflammatory response syndrome) ([place]) 02/2013   [name] 03/19/2013  (04/06/2013)  . Small bowel obstruction [facility])    Past Surgical History:  Past Surgical History:  Procedure Laterality Date  . CYSTOSCOPY W/ STONE MANIPULATION  ?1986  . NASAL FRACTURE SURGERY    . TRANSURETHRAL RESECTION OF PROSTATE     HPI:  84 y.o.malewho was at a GI appointment about bloating when he developed acute onset facial weakness and aphasia. He actually improved en route and subsequently re-worsened. Initially, he was not given IV tPA due to mild symptoms, but when he worsened within the window he was treated.    Assessment / Plan / Recommendation Clinical Impression  Pt aphasic, with significant paraphasias, difficulty following commands, high level of frustration.  Presents with dysphagia with minimal CN involvement, but immediate coughing after consumption of thin liquids, concerning for aspiration. Quite impulsive, self-feeding at rapid rate with large, sequential liquid boluses.  He appeared to tolerated nectar liquids and purees adequately with no overt s/s of aspiration.  Recommend starting dysphagia 1 diet with nectars this evening; meds whole in puree. SLP  will f/u for safety and diet progression.  SLP Visit Diagnosis: Dysphagia, unspecified (R13.10)    Aspiration Risk  Mild aspiration risk    Diet Recommendation     Medication Administration: Whole meds with puree    Other  Recommendations Oral Care Recommendations: Oral care BID Other Recommendations: Order thickener from pharmacy   Follow up Recommendations Other (comment)(tba)      Frequency and Duration min 2x/week  1 week       Prognosis Prognosis for Safe Diet Advancement: Good Barriers to Reach Goals: Cognitive deficits      Swallow Study   General Date of Onset: 10/28/17 HPI: 84 y.o.malewho was at a GI appointment about bloating when he developed acute onset facial weakness and aphasia. He actually improved en route and subsequently re-worsened. Initially, he was not given IV tPA due to mild symptoms, but when he worsened within the window he was treated.  Type of Study: Bedside Swallow Evaluation Diet Prior to this Study: NPO Temperature Spikes Noted: No Respiratory Status: Room air History of Recent Intubation: No Behavior/Cognition: Alert;Confused;Agitated Oral Cavity Assessment: Within Functional Limits Oral Care Completed by SLP: No Self-Feeding Abilities: Able to feed self Patient Positioning: Upright in bed Baseline Vocal Quality: Normal Volitional Cough: Cognitively unable to elicit Volitional Swallow: Unable to elicit    Oral/Motor/Sensory Function Overall Oral Motor/Sensory Function: Other (comment)(mild right asymmetry at rest)   Ice Chips Ice chips: Not tested   Thin Liquid Thin Liquid: Impaired Presentation: Cup;Self Fed Pharyngeal  Phase Impairments: Cough - Immediate    Nectar Thick  Nectar Thick Liquid: Within functional limits Presentation: Cup;Self Fed   Honey Thick Honey Thick Liquid: Not tested   Puree Puree: Within functional limits Presentation: Spoon   Solid   GO   Solid: Not tested        Juan Quam  Laurice 10/28/2017,4:30 PM Estill Bamberg L. Tivis Ringer, [place] CCC/SLP Pager [phone]

## 2017-10-28 NOTE — ED Notes (Signed)
Patient complaining of headache pointing to lower part of head, upper neck area. Neuro paged

## 2017-10-28 NOTE — H&P (Signed)
Neurology H&P  CC: Aphasia  History is obtained from:Daughter  HPI: Mario May is a 84 y.o. male who was at a GI appointment about bloating when he developed acute onset facial weakness and aphasia. He actually improved en route and subsequently re-worsened. Initially, he was not given IV tPA due to mild symptoms, but when he worsened within the window he was treated. CTA with no LVO.  At baseline, he has some mild memory problems, but he and his wife still live alone together and take care of their own ADLs.    LKW: 11:30 tpa given?: yes Modified Rankin Scale: 1-No significant post stroke disability and can perform usual duties with stroke symptoms  ROS: Unable to obtain due to altered mental status.   Past Medical History:  Diagnosis Date  . Anemia   . Arthritis    "just my legs" (04/06/2013)  . Assistance needed for mobility    walks with walker  . Atrial fibrillation ([place])   . Constipation   . Gout   . HOH (hard of hearing)   . Hyperlipidemia   . Hypertension   . Hypothyroidism   . Kidney stones    "only once" (04/06/2013)  . Lumbar foraminal stenosis 06/04/2016  . 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 Rheumatism   . SIRS (systemic inflammatory response syndrome) ([place]) 02/2013   [name] 03/19/2013  (04/06/2013)  . Small bowel obstruction ([facility])      Family History  Problem Relation Age of Onset  . Colon cancer Mother   . Prostate cancer Father   . Heart disease Father   . Colon polyps Brother        x 2  . Heart disease Brother      Social History:  reports that he quit smoking about 59 years ago. His smoking use included cigarettes. He smoked 0.50 packs per day. he has never used smokeless tobacco. He reports that he does not drink alcohol or use drugs.   Exam: Current vital signs: BP (!) 170/68   Pulse (!) 57   Temp (!) 97.3 F (36.3 C) Comment: axillary   Resp (!) 26   Ht 5\' 11"  (1.803 m)   Wt 71.1 kg (156 lb 12 oz)   SpO2 94%    BMI 21.86 kg/m  Vital signs in last 24 hours: Temp:  [97.3 F (36.3 C)] 97.3 F (36.3 C) (02/08 1230) Pulse Rate:  [48-103] 57 (02/08 1230) Resp:  [14-27] 26 (02/08 1230) BP: (120-183)/(68-101) 170/68 (02/08 1230) SpO2:  [94 %-100 %] 94 % (02/08 1230) Weight:  [70.3 kg (155 lb)-71.1 kg (156 lb 12 oz)] 71.1 kg (156 lb 12 oz) (02/08 1215)  Physical Exam  Constitutional: Appears well-developed and well-nourished.  Psych: Affect appropriate to situation Eyes: No scleral injection HENT: No OP obstrucion Head: Normocephalic.  Cardiovascular: Normal rate and regular rhythm.  Respiratory: Effort normal and breath sounds normal to anterior ascultation GI: Soft.  No distension. There is no tenderness.  Skin: WDI  Neuro: Mental Status: Patient is awake, alert, initially oriented, but subsequently densely aphasic.  No signs of  neglect Cranial Nerves: II: Visual Fields are full. Pupils are equal, round, and reactive to light.   III,IV, VI: EOMI without ptosis or diploplia.  V: Facial sensation is symmetric to temperature VII: Facial movement with right facial weakness VIII: hearing is intact to voice X: Uvula elevates symmetrically XI: Shoulder shrug is symmetric. XII: tongue is midline without atrophy or fasciculations.  Motor: Tone is  normal. Bulk is normal. 5/5 strength was present in all four extremities.  Sensory: Sensation appears diminished on the right after worsening, but not definite.  Cerebellar: FNF and HKS are intact bilaterally  I have reviewed labs in epic and the results pertinent to this consultation are: CMP - Cr 1.58 BUN 31 borderline bilirubin.   I have reviewed the images obtained:  CTA - no LVO that would explain his aphasia.   Primary Diagnosis:  Cerebral infarction due to embolism of left middle cerebral artery  Secondary Diagnosis: CKD Stage 3 (GFR 30-59) Accelerated hypertension.  Bradycardia.   Impression:  84 yo M With acut erigh tfacial  weakness and aphasia consistent with a small embolic stroke. Likely secondary to afib without anticoagulation. He has recived IV tPA and is not an IR candidate.   His bradycardia is chronic, and will not use nodal blockers(e.g. Labetalol).   Recommendations: 1. HgbA1c, fasting lipid panel 2. MRI  of the brain without contrast 3. Frequent neuro checks 4. Echocardiogram 5. Continue home synthroid, proscar, isordil, protonix if passes swallow.  6. Prophylactic therapy-none for 24 hours.  7. Risk factor modification 8. Telemetry monitoring 9. PT consult, OT consult, Speech consult 10. cardene for hypertension.  11. please page stroke NP  Or  PA  Or MD  from 8am -4 pm as this patient will be followed by the stroke team at this point.   You can look them up on [email or web address]      This patient is critically ill and at significant risk of neurological worsening, death and care requires constant monitoring of vital signs, hemodynamics,respiratory and cardiac monitoring, neurological assessment, discussion with family, other specialists and medical decision making of high complexity. I spent 50 minutes of neurocritical care time  in the care of  this patient.  Roland Rack, MD [facility] [phone]  If 7pm- 7am, please page neurology on call as listed in [place]. 10/28/2017  12:35 PM

## 2017-10-28 NOTE — ED Notes (Signed)
Cardene drip paused for Ct angio

## 2017-10-28 NOTE — ED Notes (Signed)
Per [facility], pt from [place] dr's office where he was being seen for abd issues. At 1105 pt was with his dr in the clinic and began having sudden aphasia with right sided facial droop. EMS also reported slight right arm weakness. Pt is hearing impaired. Pt met at [facility] by Neuro and EDP. Pt hypertensive with EMS. Sx improving on arrival.

## 2017-10-28 NOTE — ED Notes (Signed)
Dr. Leonel Ramsay paged to [phone] RN

## 2017-10-28 NOTE — ED Notes (Signed)
TPA started 58 mg/hr

## 2017-10-28 NOTE — ED Notes (Signed)
Patient c/o headache. Neuro aware

## 2017-10-28 NOTE — ED Notes (Signed)
Pt becoming more aphasic again with dysarthria.

## 2017-10-28 NOTE — ED Provider Notes (Signed)
[place] EMERGENCY DEPARTMENT Provider Note   CSN: [id number] Arrival date & time: 10/28/17  1156     History   Chief Complaint Chief Complaint  Patient presents with  . Code Stroke    HPI Mario May is a 84 y.o. male.  Patient is 84 year old male with a history of atrial fibrillation, not on anticoagulants, hypertension, hyperlipidemia who presents as a code stroke.  He was at his gastroenterology office when he had a sudden onset of aphasia and facial drooping with right-sided weakness.  This started at 1105.  Code stroke was initiated and patient was transported by EMS.  He had had some initial improvement on arrival to ED although currently he is having worsening symptoms with worsening aphasia, dysarthria and facial drooping.  He has no known history of strokes.  No recent falls or trauma.      Past Medical History:  Diagnosis Date  . Anemia   . Arthritis    "just my legs" (04/06/2013)  . Assistance needed for mobility    walks with walker  . Atrial fibrillation ([place])   . Constipation   . Gout   . HOH (hard of hearing)   . Hyperlipidemia   . Hypertension   . Hypothyroidism   . Kidney stones    "only once" (04/06/2013)  . Lumbar foraminal stenosis 06/04/2016  . 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 Rheumatism   . SIRS (systemic inflammatory response syndrome) ([place]) 02/2013   [name] 03/19/2013  (04/06/2013)  . Small bowel obstruction [facility])     Patient Active Problem List   Diagnosis Date Noted  . Stroke (cerebrum) ([place]) 10/28/2017  . CAP (community acquired pneumonia) 08/21/2017  . Neck pain 08/21/2017  . Bradycardia 01/23/2017  . Atrial fibrillation ([place]) 01/23/2017  . Dehydration 08/19/2016  . Elevated troponin 08/18/2016  . AKI (acute kidney injury) ([place]) 08/18/2016  . Difficulty in walking, not elsewhere classified   . Gait instability   . Left leg cellulitis 06/05/2016  . Orthostatic hypotension 06/04/2016    . 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 Lumbar foraminal stenosis 06/04/2016  . DJD (degenerative joint disease), multiple sites 06/04/2016  . Fever 06/04/2016  . Leukocytosis 06/04/2016  . Stasis dermatitis of both legs 07/01/2013  . Essential hypertension 03/15/2013  . Hypothyroidism, adult 03/18/2009  . CONSTIPATION 03/18/2009  . CHANGE IN BOWELS 03/18/2009    Past Surgical History:  Procedure Laterality Date  . CYSTOSCOPY W/ STONE MANIPULATION  ?1986  . NASAL FRACTURE SURGERY    . TRANSURETHRAL RESECTION OF PROSTATE         Home Medications    Prior to Admission medications   Medication Sig Start Date End Date Taking? Authorizing Provider  allopurinol (ZYLOPRIM) 100 MG tablet Take 100 mg by mouth daily.    [provider]  aspirin 325 MG tablet Take 325 mg by mouth daily.    [provider]  finasteride (PROSCAR) 5 MG tablet Take 5 mg by mouth daily.     [provider]  furosemide (LASIX) 20 MG tablet Take 10 mg by mouth daily.  05/19/16   [provider]  isosorbide dinitrate (ISORDIL) 10 MG tablet Take 10 mg by mouth 2 (two) times daily.    [provider]  levothyroxine (SYNTHROID, LEVOTHROID) 50 MCG tablet Take 50 mcg by mouth daily before breakfast.    [provider]  meclizine (ANTIVERT) 25 MG tablet Take 25 mg by mouth 3 (three) times daily as  needed (inner ear problems).    [provider]  ondansetron (ZOFRAN) 4 MG tablet Take 1 tablet (4 mg total) by mouth every 6 (six) hours as needed. 9/47/09   Delora Fuel, MD  pantoprazole (PROTONIX) 40 MG tablet Take 1 tablet (40 mg total) by mouth daily. 03/18/35   Delora Fuel, MD  polyethylene glycol [facility] Floria Raveling) packet Take 17 g by mouth daily as needed for mild constipation.     [provider]  Simethicone (GAS-X PO) Take 1 tablet by mouth as needed.    [provider]    Family History Family History  Problem  Relation Age of Onset  . Colon cancer Mother   . Prostate cancer Father   . Heart disease Father   . Colon polyps Brother        x 2  . Heart disease Brother     Social History Social History   Tobacco Use  . Smoking status: Former Smoker    Packs/day: 0.50    Types: Cigarettes    Last attempt to quit: 1960    Years since quitting: 59.1  . Smokeless tobacco: Never Used  Substance Use Topics  . Alcohol use: No    Comment: 04/06/2013 "last time I've had any alcohol was 09/12/1945; never had a problem w/it"  . Drug use: No     Allergies   Codeine; Grapefruit concentrate; Other; and Pineapple   Review of Systems Review of Systems  Unable to perform ROS: Mental status change     Physical Exam Updated Vital Signs BP (!) 170/68   Pulse (!) 57   Temp (!) 97.3 F (36.3 C) Comment: axillary   Resp (!) 26   Ht 5\' 11"  (1.803 m)   Wt 71.1 kg (156 lb 12 oz)   SpO2 94%   BMI 21.86 kg/m   Physical Exam  Constitutional: He appears well-developed and well-nourished.  HENT:  Head: Normocephalic and atraumatic.  Eyes: Pupils are equal, round, and reactive to light.  Neck: Normal range of motion. Neck supple.  Cardiovascular: Regular rhythm and normal heart sounds. Bradycardia present.  Pulmonary/Chest: Effort normal and breath sounds normal. No respiratory distress. He has no wheezes. He has no rales. He exhibits no tenderness.  Abdominal: Soft. Bowel sounds are normal. There is no tenderness. There is no rebound and no guarding.  Musculoskeletal: Normal range of motion. He exhibits no edema.  Lymphadenopathy:    He has no cervical adenopathy.  Neurological: He is alert.  Patient has difficulty with naming, he has dysarthria present, he has some right-sided facial drooping noted, he has no drift in the extremities  Skin: Skin is warm and dry. No rash noted.  Psychiatric: He has a normal mood and affect.     ED Treatments / Results  Labs (all labs ordered are listed,  but only abnormal results are displayed) Labs Reviewed  I-STAT CHEM 8, ED - Abnormal; Notable for the following components:      Result Value   Chloride 99 (*)    BUN 41 (*)    Creatinine, Ser 1.50 (*)    Glucose, Bld 103 (*)    Calcium, Ion 1.03 (*)    All other components within normal limits  I-STAT TROPONIN, ED  CBG MONITORING, ED    EKG  EKG Interpretation  Date/Time:  Friday October 28 2017 12:20:16 EST Ventricular Rate:  51 PR Interval:    QRS Duration: 98 QT Interval:  490 QTC Calculation: 452 R  Axis:   13 Text Interpretation:  Atrial fibrillation Ventricular premature complex Borderline repolarization abnormality Confirmed by Malvin Johns [phone]) on 10/28/2017 12:31:50 PM       Radiology Ct Head Code Stroke Wo Contrast  Result Date: 10/28/2017 CLINICAL DATA:  Code stroke.  Right facial droop and aphasia. EXAM: CT HEAD WITHOUT CONTRAST TECHNIQUE: Contiguous axial images were obtained from the base of the skull through the vertex without intravenous contrast. COMPARISON:  01/23/2017 FINDINGS: Brain: There is no evidence of acute infarct, intracranial hemorrhage, mass, midline shift, or extra-axial fluid collection. Patchy cerebral white matter hypodensities are similar to the prior study and compatible with moderate chronic small vessel ischemic disease. There is a chronic lacunar infarct in the left internal capsule. Cerebral atrophy is unchanged and likely normal for age. Vascular: Calcified atherosclerosis at the skull base. No hyperdense vessel. Skull: No fracture or focal osseous lesion. Sinuses/Orbits: Partially polypoid mucosal thickening in the inferior right maxillary sinus. Clear mastoid air cells. Unremarkable orbits. Other: None. ASPECTS [facility] Stroke Program Early CT Score) - Ganglionic level infarction (caudate, lentiform nuclei, internal capsule, insula, M1-M3 cortex): 7 - Supraganglionic infarction (M4-M6 cortex): 3 Total score (0-10 with 10 being normal): 10  IMPRESSION: 1. No evidence of acute intracranial abnormality. 2. ASPECTS is 10. 3. Moderate chronic small vessel ischemic disease. Electronically Signed   By: Logan Bores M.D.   On: 10/28/2017 12:17    Procedures Procedures (including critical care time)  Medications Ordered in ED Medications  nicardipine (CARDENE) 20mg  in 0.86% saline 293ml IV infusion (0.1 mg/ml) (2.5 mg/hr Intravenous Rate/Dose Change 10/28/17 1239)  alteplase (ACTIVASE) 1 mg/mL infusion 64 mg (64 mg Intravenous New Bag/Given 10/28/17 1218)    Followed by  0.9 %  sodium chloride infusion (not administered)   stroke: mapping our early stages of recovery book (not administered)  0.9 %  sodium chloride infusion (not administered)  acetaminophen (TYLENOL) tablet 650 mg (not administered)    Or  acetaminophen (TYLENOL) solution 650 mg (not administered)    Or  acetaminophen (TYLENOL) suppository 650 mg (not administered)  isosorbide dinitrate (ISORDIL) tablet 10 mg (not administered)  finasteride (PROSCAR) tablet 5 mg (not administered)  pantoprazole (PROTONIX) EC tablet 40 mg (not administered)  levothyroxine (SYNTHROID, LEVOTHROID) tablet 50 mcg (not administered)  allopurinol (ZYLOPRIM) tablet 100 mg (not administered)     Initial Impression / Assessment and Plan / ED Course  I have reviewed the triage vital signs and the nursing notes.  Pertinent labs & imaging results that were available during my care of the patient were reviewed by me and considered in my medical decision making (see chart for details).     Patient was seen by Dr. Leonel Ramsay neurology and given TPA.  He is noted to be bradycardic but this is documented by his cardiologist in the past.  He has no hypotension associated with this.  Will be admitted to the neuro ICU by Dr. Leonel Ramsay.  He is maintaining his airway.  CRITICAL CARE Performed by: Malvin Johns Total critical care time: 30 minutes Critical care time was exclusive of separately  billable procedures and treating other patients. Critical care was necessary to treat or prevent imminent or life-threatening deterioration. Critical care was time spent personally by me on the following activities: development of treatment plan with patient and/or surrogate as well as nursing, discussions with consultants, evaluation of patient's response to treatment, examination of patient, obtaining history from patient or surrogate, ordering and performing treatments and interventions, ordering and review  of laboratory studies, ordering and review of radiographic studies, pulse oximetry and re-evaluation of patient's condition.   Final Clinical Impressions(s) / ED Diagnoses   Final diagnoses:  Acute ischemic stroke [facility])    ED Discharge Orders    None       Malvin Johns, MD 10/28/17 1240

## 2017-10-28 NOTE — Progress Notes (Signed)
Mario May 84 y.o. 18-Dec-1914 [id number]  Assessment & Plan:   Encounter Diagnoses  Name Primary?  . Neurologic abnormality   . Chronic constipation Yes  . Abdominal bloating   . Loss of weight     He may have had a stroke.  It could be evolving.  He seems to have a decreased nasolabial fold and some ptosis on the right.  EMS has been called because of the acute onset.  Regarding his GI symptoms these are chronic and worse.  I wonder if he might not have some mesenteric ischemia.  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  I am not sure how much we can do beyond pushing laxatives in a man this elderly.  We will have to await his neurologic evaluation and then will follow up with suggestions.  Gatha Mayer, MD, [facility]   Subjective:   Chief Complaint: Constipation and bloating. HPI The patient presents today with his daughter, he has chronic constipation and that is been worsening and because of bloating and distention he is actually eating less.  However after arriving here today she noticed a change in that he was not talking clearly, ordering some gibberish, and not making sense when he talks.  He does seem to be following commands.  She has been using citrate of magnesia on top of MiraLAX and he will move his bowels some and Senokot will help if he takes it but he is kind of given up on eating because he is so bothered by this.  Weights are listed below.  He was seen in the emergency department and a CT scan report is listed below.  I  Wt Readings from Last 3 Encounters:  10/28/17 155 lb (70.3 kg)  10/05/17 165 lb (74.8 kg)  08/21/17 170 lb (77.1 kg)  CT Abd/pelvis 10/05/2017 1. No acute intra-abdominal or pelvic pathology. No bowel obstruction or active inflammation. 2. Mildly distended stomach. No evidence of gastric outlet obstruction. 3. Mildly distended gallbladder.  No calcified stone or inflammatory changes. 4. Ill-defined soft tissue mass arising from the inferior uncinate process of the pancreas as seen on the prior CT and MRI. Follow-up as previously recommended. 5.  Aortic Atherosclerosis (ICD10-I70.0). Allergies  Allergen Reactions  . Codeine Other (See Comments)    Passes out  . Grapefruit Concentrate     unknown  . Other     Potatoes, green beans, corn - unknown  . Pineapple     unknown   Current Meds  Medication Sig  . allopurinol (ZYLOPRIM) 100 MG tablet Take 100 mg by mouth daily.  Marland Kitchen aspirin 325 MG tablet Take 325 mg by mouth daily.  . finasteride (PROSCAR) 5 MG tablet Take 5 mg by mouth daily.   . furosemide (LASIX) 20 MG tablet Take 10 mg by mouth daily.   . isosorbide dinitrate (ISORDIL) 10 MG tablet Take 10 mg by mouth 2 (two) times daily.  Marland Kitchen levothyroxine (SYNTHROID, LEVOTHROID) 50 MCG tablet Take 50 mcg by mouth daily before breakfast.  . meclizine (ANTIVERT) 25 MG tablet Take 25 mg by mouth 3 (three) times daily as needed (inner ear problems).  . ondansetron (ZOFRAN) 4 MG tablet Take 1 tablet (4 mg total) by mouth every 6 (six) hours as needed.  . pantoprazole (PROTONIX) 40 MG tablet Take 1 tablet (40 mg total) by mouth daily.  . polyethylene glycol (MIRALAX / GLYCOLAX)  packet Take 17 g by mouth daily as needed for mild constipation.   . Simethicone (GAS-X PO) Take 1 tablet by mouth as needed.   Past Medical History:  Diagnosis Date  . Anemia   . Arthritis    "just my legs" (04/06/2013)  . Assistance needed for mobility    walks with walker  . Constipation   . Gout   . HOH (hard of hearing)   . Hyperlipidemia   . Hypertension   . Hypothyroidism   . Kidney stones    "only once" (04/06/2013)  . Lumbar foraminal stenosis 06/04/2016  . 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 Rheumatism   . SIRS (systemic inflammatory response syndrome) ([place]) 02/2013   [name] 03/19/2013  (04/06/2013)  . Small bowel  obstruction [facility])    Past Surgical History:  Procedure Laterality Date  . CYSTOSCOPY W/ STONE MANIPULATION  ?1986  . NASAL FRACTURE SURGERY    . TRANSURETHRAL RESECTION OF PROSTATE     Social History   Social History Narrative   Patient is married he has 2 children he is retired   No alcohol tobacco or drug use   family history includes Colon cancer in his mother; Colon polyps in his brother; Heart disease in his brother and father; Prostate cancer in his father.   Review of Systems As per HPI  Objective:   Physical Exam BP 120/70   Pulse 70   Ht 5\' 11"  (1.803 m)   Wt 155 lb (70.3 kg)   BMI 21.62 kg/m  Elderly white man in no significant acute distress sitting in a wheelchair hard of hearing has hearing aids He follows commands he knew he was in a doctor's office his daughter thinks his speech is different than before He appears to have a decreased nasolabial fold some mild ptosis on the right also. Cursory neuro exam shows good symmetrical upper arm strength and the other cranial nerves appear to be relatively intact.

## 2017-10-29 ENCOUNTER — Inpatient Hospital Stay (HOSPITAL_COMMUNITY): Payer: Medicare Other

## 2017-10-29 ENCOUNTER — Other Ambulatory Visit: Payer: Self-pay

## 2017-10-29 DIAGNOSIS — I6300 Cerebral infarction due to thrombosis of unspecified precerebral artery: Secondary | ICD-10-CM

## 2017-10-29 LAB — LIPID PANEL
CHOLESTEROL: 191 mg/dL (ref 0–200)
HDL: 43 mg/dL (ref >40)
LDL CALC: 130 mg/dL — AB (ref 0–99)
TRIGLYCERIDES: 92 mg/dL (ref <150)
Total CHOL/HDL Ratio: 4.4 RATIO
VLDL: 18 mg/dL (ref 0–40)

## 2017-10-29 LAB — HEMOGLOBIN A1C
HEMOGLOBIN A1C: 5.9 % — AB (ref 4.8–5.6)
Mean Plasma Glucose: 122.63 mg/dL

## 2017-10-29 MED ORDER — ASPIRIN 81 MG PO CHEW
CHEWABLE_TABLET | ORAL | Status: AC
  Administered 2017-10-29: 81 mg
  Filled 2017-10-29: qty 1

## 2017-10-29 MED ORDER — PRAVASTATIN SODIUM 20 MG PO TABS
20.0000 mg | ORAL_TABLET | Freq: Every day | ORAL | Status: DC
  Administered 2017-10-29 – 2017-10-30 (×2): 20 mg via ORAL
  Filled 2017-10-29 (×2): qty 1

## 2017-10-29 MED ORDER — ASPIRIN EC 81 MG PO TBEC
81.0000 mg | DELAYED_RELEASE_TABLET | Freq: Every day | ORAL | Status: DC
  Administered 2017-10-30 – 2017-10-31 (×2): 81 mg via ORAL
  Filled 2017-10-29 (×2): qty 1

## 2017-10-29 NOTE — Progress Notes (Signed)
SLP Cancellation Note  Patient Details Name: Mario May MRN: [id number] DOB: [birth year]   Cancelled treatment:       Reason Eval/Treat Not Completed: Patient at procedure or test/unavailable. Will f/u for diet tolerance, cognitive-linguistic assessment.  Deneise Lever, [place], [place] Speech-Language Pathologist [place] 10/29/2017, 11:44 AM

## 2017-10-29 NOTE — Progress Notes (Signed)
Pt returned from MRI/CT trip. Pt unable to tolerate MRI due to neck pain. MRI tech attempted to elevate pt's neck in order to decrease discomfort, but pt continued to state, "I can't lay like this." CT completed per 24 hr tpa protocol. Dr. Leonie Man aware.

## 2017-10-29 NOTE — Progress Notes (Signed)
CCMD called- pt. had a 2.36 sec. pause, no problems noted.

## 2017-10-29 NOTE — Progress Notes (Signed)
Spoke with Dr. Leonie Man. Given order to administer ASA based on CT results. Also, stated it is okay for pt to tx to 3W.

## 2017-10-29 NOTE — Progress Notes (Signed)
STROKE TEAM PROGRESS NOTE   HISTORY OF PRESENT ILLNESS (per record) Mario May is a 84 y.o. male who was at a GI appointment about bloating when he developed acute onset facial weakness and aphasia. He actually improved en route and subsequently re-worsened. Initially, he was not given IV tPA due to mild symptoms, but when he worsened within the window he was treated. CTA with no LVO. At baseline, he has some mild memory problems, but he and his wife still live alone together and take care of their own ADLs.   LKW: 11:30 tpa given?: yes Modified Rankin Scale: 1-No significant post stroke disability and can perform usual duties with stroke symptoms     SUBJECTIVE (INTERVAL HISTORY) The patient is extremely hard of hearing. No family members were present. The nursing staff expressed concerns over severe bradycardia while the patient was sleeping. Dr. Leonie Man spoke with his daughter by phone - apparently the patient had been seen by cardiology in the past and a pacemaker was discussed but not felt to be indicated. The patient appeared to be in good spirits and voiced no complaints today.   OBJECTIVE Temp:  [97.3 F (36.3 C)-98.3 F (36.8 C)] 97.9 F (36.6 C) (02/09 0800) Pulse Rate:  [40-126] 62 (02/09 0800) Cardiac Rhythm: Atrial fibrillation (02/09 0800) Resp:  [14-52] 18 (02/09 0800) BP: (110-183)/(50-101) 139/70 (02/09 0800) SpO2:  [90 %-100 %] 100 % (02/09 0800) Weight:  [155 lb (70.3 kg)-156 lb 12 oz (71.1 kg)] 156 lb 12 oz (71.1 kg) (02/08 1215)  CBC:  Recent Labs  Lab 10/28/17 1206 10/28/17 1300  WBC  --  6.9  NEUTROABS  --  4.1  HGB 13.6 12.4*  HCT 40.0 38.0*  MCV  --  96.9  PLT  --  798    Basic Metabolic Panel:  Recent Labs  Lab 10/28/17 1206 10/28/17 1300  NA 136 136  K 4.1 3.8  CL 99* 99*  CO2  --  20*  GLUCOSE 103* 100*  BUN 41* 31*  CREATININE 1.50* 1.58*  CALCIUM  --  9.2    Lipid Panel:     Component Value Date/Time   CHOL 191  10/29/2017 0517   TRIG 92 10/29/2017 0517   HDL 43 10/29/2017 0517   CHOLHDL 4.4 10/29/2017 0517   VLDL 18 10/29/2017 0517   LDLCALC 130 (H) 10/29/2017 0517   HgbA1c:  Lab Results  Component Value Date   HGBA1C 5.9 (H) 10/29/2017   Urine Drug Screen: No results found for: LABOPIA, COCAINSCRNUR, LABBENZ, AMPHETMU, THCU, LABBARB  Alcohol Level No results found for: [facility]  IMAGING  CT head without contrast 10/29/2017  Ct Angio Head W Or Wo Contrast Ct Angio Neck W Or Wo Contrast 10/28/2017 IMPRESSION:  1. New high-grade stenosis or occlusion of the right vertebral artery at the C1-2 level.  2. High-grade stenosis of the right vertebral artery at the dural margin.  3. Extensive distal small vessel disease is otherwise stable.  4. No new significant proximal stenosis or occlusion in the anterior circulation.  5. Atherosclerotic disease at the aortic arch and bilateral carotid bifurcations is stable.    Ct Head Wo Contrast 10/28/2017 IMPRESSION:  Severely motion degraded.  No gross hemorrhage or change from prior.   Ct Head Wo Contrast 10/29/2017 IMPRESSION: Stable compared to admission head CT.No hemorrhage or visible infarct.   Ct Head Code Stroke Wo Contrast 10/28/2017 IMPRESSION:  1. No evidence of acute intracranial abnormality.  2. ASPECTS is 10.  3. Moderate chronic small vessel ischemic disease.     Transthoracic Echocardiogram - pending 00/00/00     PHYSICAL EXAM Vitals:   10/29/17 0500 10/29/17 0600 10/29/17 0700 10/29/17 0800  BP: (!) 165/93 110/70 129/63 139/70  Pulse: 65 (!) 41 (!) 57 62  Resp: 16 14 16 18   Temp:    97.9 F (36.6 C)  TempSrc:    Oral  SpO2: 100% 100% 100% 100%  Weight:      Height:       Frail malnourished looking elderly caucasian male  . Afebrile. Head is nontraumatic. Neck is supple without bruit.    Cardiac exam no murmur or gallop. Lungs are clear to auscultation. Distal pulses are well felt. Neurological Exam ;  Awake  Alert  oriented x 3. Patient is extremely hard of hearing Normal speech and language.eye movements full without nystagmus.fundi were not visualized. Vision acuity and fields appear normal. Hearing is poor bilaterally Palatal movements are normal. Face symmetric. Tongue midline. Normal strength, tone, reflexes and coordination. Normal sensation. Gait deferred.    ASSESSMENT/PLAN Mario May is a 84 y.o. male with history of hearing impairment, permanent atrial fibrillation, bradycardia, hypertension, anemia, memory problems and hypothyroidism  presenting with facial weakness and aphasia. The patient received IV TPA on Friday, 10/28/2017 at 1230.  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  Resultant  no focal deficits   CT head - Severely motion degraded.  CT Head - F/U TPA - Stable compared to admission head CT.No hemorrhage or visible infarct. Start ASA.  MRI head - pending  MRA head - not performed  CTA H&N - New high-grade stenosis or occlusion of the right vertebral artery at the C1-2 level. High-grade stenosis of the right vertebral artery at the dural margin.   Carotid Doppler - CTA neck  2D Echo - pending  LDL - 130  HgbA1c - 5.9  VTE prophylaxis - SCDs Check puncture sites for bleeding or hematomas. Bleeding precautions Fall precautions DIET - DYS 1 Room service appropriate? Yes; Fluid consistency: Nectar Thick  aspirin 325 mg daily prior to admission, now on No antithrombotic S/P TPA  Ongoing aggressive stroke risk factor management  Therapy recommendations:  pending  Disposition:  Pending  Hypertension  Stable  Permissive hypertension (OK if < 220/120) but gradually normalize in 5-7 days  Long-term BP goal normotensive  Hyperlipidemia  Home meds: No lipid lowering medications prior to admission  LDL 130, goal < 70  Add Pravachol 20 mg daily  Continue statin at discharge   Other Stroke Risk Factors  Advanced  age  Former cigarette smoker - quit almost 60 years ago.  Atrial fibrillation - not anticoagulated    Other Active Problems  Multiple food allergies  Bradycardia - Will check TSH. Discussed with cardiology. No plans to treat bradycardia that occurs while sleeping. Would consider pacemaker only for symptomatic bradycardia while awake.   Hearing-impaired  Elevated BUN and creatinine - ? Chronic kidney disease.   Plan / Recommendations   Stroke workup   Hospital day # 1  Mikey Bussing PA-C [facility] Pager [phone] 10/29/2017, 1:09 PM I have personally examined this patient, reviewed notes, independently viewed imaging studies, participated in medical decision making and plan of care.ROS completed by me personally and pertinent positives fully documented  I have made any additions or clarifications directly to the above note. Agree with note above. Patient has presented with an embolic likely small stroke due to  atrial fibrillation but received IV tPA and seems to be doing well. Recommend strict blood pressure control and close neurological monitoring as per post TPA protocol. I had a long discussion with the patient's wife at the bedside and daughter over the phone regarding his clinical presentation, plan for evaluation, treatment and answered questions. Physical occupational therapy consults and mobilize out of bed and transfer to floor bed later today. Start aspirin for today and will consider eliquis after discussion with family at the time of discharge This patient is critically ill and at significant risk of neurological worsening, death and care requires constant monitoring of vital signs, hemodynamics,respiratory and cardiac monitoring, extensive review of multiple databases, frequent neurological assessment, discussion with family, other specialists and medical decision making of high complexity.I have made any additions or clarifications directly to the above  note.This critical care time does not reflect procedure time, or teaching time or supervisory time of PA/NP/Med Resident etc but could involve care discussion time.  I spent 35 minutes of neurocritical care time  in the care of  this patient.      Antony Contras, MD Medical Director [place] Pager: [phone] 10/29/2017 2:32 PM   To contact Stroke Continuity provider, please refer to [email or web address] After hours, contact General Neurology

## 2017-10-29 NOTE — Evaluation (Signed)
Physical Therapy Evaluation Patient Details Name: Mario May MRN: [id number] DOB: 1914/12/01 Today's Date: 10/29/2017   History of Present Illness  84 y.o. male who was at a GI appointment about bloating when he developed acute onset facial weakness and aphasia. He actually improved en route and subsequently re-worsened. Initially, he was not given IV tPA due to mild symptoms, but when he worsened within the window he was treated  Clinical Impression  Orders received for PT evaluation. Patient demonstrates deficits in functional mobility as indicated below. Will benefit from continued skilled PT to address deficits and maximize function. Will see as indicated and progress as tolerated.  At this time, patient will need post acute rehabilitation as patient currently requiring +1 max to +2 physical assist.     Follow Up Recommendations CIR;Supervision/Assistance - 24 hour    Equipment Recommendations  (TBD)    Recommendations for Other Services       Precautions / Restrictions Precautions Precautions: Fall      Mobility  Bed Mobility Overal bed mobility: Needs Assistance Bed Mobility: Supine to Sit     Supine to sit: Mod assist     General bed mobility comments: moderate assist to pull trunk to upright and rotate hips to EOB  Transfers Overall transfer level: Needs assistance Equipment used: 1 person hand held assist Transfers: Sit to/from [employer] Transfers Sit to Stand: Mod assist Stand pivot transfers: Max assist       General transfer comment: moderate assist to power to upright, inability to maintain erect posture. Max multi modal cues to have patient release grip from bed to perform transfer. Competed pivot to chair with face to face using gait belt. Multiple attempts to perform (would benefit from +2 assist)  Ambulation/Gait             General Gait Details: unable to safely perform at this time, patient extremely anxious  Stairs             Wheelchair Mobility    Modified Rankin (Stroke Patients Only) Modified Rankin (Stroke Patients Only) Pre-Morbid Rankin Score: Moderate disability Modified Rankin: Severe disability     Balance Overall balance assessment: Needs assistance   Sitting balance-Leahy Scale: Poor Sitting balance - Comments: posterior bias noted Postural control: Posterior lean   Standing balance-Leahy Scale: Poor Standing balance comment: moderate to max assist to maintain upright position                             Pertinent Vitals/Pain Pain Assessment: Faces Faces Pain Scale: Hurts little more Pain Location: back/neck region Pain Descriptors / Indicators: Grimacing Pain Intervention(s): Monitored during session    Home Living Family/patient expects to be discharged to:: Private residence Living Arrangements: Spouse/significant other Available Help at Discharge: Family;Available PRN/intermittently(son comes occasionally, and daughter helps with wife) Type of Home: House Home Access: Stairs to enter Entrance Stairs-Rails: None Entrance Stairs-Number of Steps: 1 Home Layout: One level Home Equipment: Environmental consultant - 2 wheels      Prior Function Level of Independence: Independent with assistive device(s)               Hand Dominance   Dominant Hand: Right    Extremity/Trunk Assessment   Upper Extremity Assessment Upper Extremity Assessment: Defer to OT evaluation    Lower Extremity Assessment Lower Extremity Assessment: Generalized weakness(difficult to assess dur to communication barrier)       Communication   Communication: Expressive difficulties;HOH(dysarthric speech)  Cognition Arousal/Alertness: Awake/alert Behavior During Therapy: Anxious Overall Cognitive Status: Difficult to assess Area of Impairment: Attention;Following commands;Awareness;Problem solving                   Current Attention Level: Sustained   Following Commands: Follows one  step commands with increased time   Awareness: Intellectual;Emergent Problem Solving: Slow processing;Requires verbal cues;Requires tactile cues        General Comments      Exercises     Assessment/Plan    PT Assessment Patient needs continued PT services  PT Problem List Decreased strength;Decreased activity tolerance;Decreased balance;Decreased mobility;Decreased coordination;Decreased cognition;Decreased safety awareness       PT Treatment Interventions DME instruction;Gait training;Functional mobility training;Therapeutic activities;Therapeutic exercise;Balance training;Patient/family education    PT Goals (Current goals can be found in the Care Plan section)  Acute Rehab PT Goals Patient Stated Goal: none stated PT Goal Formulation: With patient/family Time For Goal Achievement: 11/12/17 Potential to Achieve Goals: Fair    Frequency Min 4X/week   Barriers to discharge        Co-evaluation               AM-PAC PT "6 Clicks" Daily Activity  Outcome Measure Difficulty turning over in bed (including adjusting bedclothes, sheets and blankets)?: Unable Difficulty moving from lying on back to sitting on the side of the bed? : Unable Difficulty sitting down on and standing up from a chair with arms (e.g., wheelchair, bedside commode, etc,.)?: Unable Help needed moving to and from a bed to chair (including a wheelchair)?: A Lot Help needed walking in hospital room?: A Lot Help needed climbing 3-5 steps with a railing? : Total 6 Click Score: 8    End of Session Equipment Utilized During Treatment: Gait belt Activity Tolerance: No increased pain Patient left: in chair;with call bell/phone within reach Nurse Communication: Mobility status PT Visit Diagnosis: Other symptoms and signs involving the nervous system (R29.898)    Time: 4270-6237 PT Time Calculation (min) (ACUTE ONLY): 17 min   Charges:   PT Evaluation $PT Eval Moderate Complexity: 1 Mod     PT  G Codes:        Alben Deeds, PT DPT  Board Certified Neurologic Specialist [place] 10/29/2017, 1:36 PM

## 2017-10-30 ENCOUNTER — Inpatient Hospital Stay (HOSPITAL_COMMUNITY): Payer: Medicare Other

## 2017-10-30 DIAGNOSIS — I63412 Cerebral infarction due to embolism of left middle cerebral artery: Secondary | ICD-10-CM

## 2017-10-30 DIAGNOSIS — I4891 Unspecified atrial fibrillation: Secondary | ICD-10-CM

## 2017-10-30 DIAGNOSIS — I10 Essential (primary) hypertension: Secondary | ICD-10-CM

## 2017-10-30 LAB — CBC WITH DIFFERENTIAL/PLATELET
BASOS PCT: 0 %
Basophils Absolute: 0 10*3/uL (ref 0.0–0.1)
EOS ABS: 0.1 10*3/uL (ref 0.0–0.7)
EOS PCT: 1 %
HCT: 36.2 % — ABNORMAL LOW (ref 39.0–52.0)
Hemoglobin: 12.3 g/dL — ABNORMAL LOW (ref 13.0–17.0)
LYMPHS ABS: 1.4 10*3/uL (ref 0.7–4.0)
Lymphocytes Relative: 24 %
MCH: 32.8 pg (ref 26.0–34.0)
MCHC: 34 g/dL (ref 30.0–36.0)
MCV: 96.5 fL (ref 78.0–100.0)
Monocytes Absolute: 0.3 10*3/uL (ref 0.1–1.0)
Monocytes Relative: 6 %
Neutro Abs: 3.9 10*3/uL (ref 1.7–7.7)
Neutrophils Relative %: 69 %
PLATELETS: 171 10*3/uL (ref 150–400)
RBC: 3.75 MIL/uL — AB (ref 4.22–5.81)
RDW: 14.6 % (ref 11.5–15.5)
WBC: 5.7 10*3/uL (ref 4.0–10.5)

## 2017-10-30 LAB — BASIC METABOLIC PANEL
ANION GAP: 10 (ref 5–15)
BUN: 17 mg/dL (ref 6–20)
CO2: 23 mmol/L (ref 22–32)
Calcium: 8.9 mg/dL (ref 8.9–10.3)
Chloride: 108 mmol/L (ref 101–111)
Creatinine, Ser: 0.91 mg/dL (ref 0.61–1.24)
GFR calc Af Amer: >60 mL/min (ref >60)
Glucose, Bld: 95 mg/dL (ref 65–99)
POTASSIUM: 4.2 mmol/L (ref 3.5–5.1)
SODIUM: 141 mmol/L (ref 135–145)

## 2017-10-30 LAB — COMPREHENSIVE METABOLIC PANEL
ALBUMIN: 3 g/dL — AB (ref 3.5–5.0)
ALT: 9 U/L — ABNORMAL LOW (ref 17–63)
ANION GAP: 12 (ref 5–15)
AST: 27 U/L (ref 15–41)
Alkaline Phosphatase: 116 U/L (ref 38–126)
BILIRUBIN TOTAL: 1.2 mg/dL (ref 0.3–1.2)
BUN: 21 mg/dL — ABNORMAL HIGH (ref 6–20)
CO2: 19 mmol/L — ABNORMAL LOW (ref 22–32)
Calcium: 8.6 mg/dL — ABNORMAL LOW (ref 8.9–10.3)
Chloride: 109 mmol/L (ref 101–111)
Creatinine, Ser: 1 mg/dL (ref 0.61–1.24)
GFR calc Af Amer: >60 mL/min (ref >60)
GFR, EST NON AFRICAN AMERICAN: 59 mL/min — AB (ref >60)
GLUCOSE: 99 mg/dL (ref 65–99)
POTASSIUM: 2.9 mmol/L — AB (ref 3.5–5.1)
Sodium: 140 mmol/L (ref 135–145)
TOTAL PROTEIN: 6.1 g/dL — AB (ref 6.5–8.1)

## 2017-10-30 LAB — TSH: TSH: 2.359 u[IU]/mL (ref 0.350–4.500)

## 2017-10-30 LAB — ECHOCARDIOGRAM COMPLETE
HEIGHTINCHES: 71 in
WEIGHTICAEL: 2507.95 [oz_av]

## 2017-10-30 LAB — MAGNESIUM
MAGNESIUM: 1.6 mg/dL — AB (ref 1.7–2.4)
MAGNESIUM: 2.1 mg/dL (ref 1.7–2.4)

## 2017-10-30 MED ORDER — POTASSIUM CHLORIDE CRYS ER 20 MEQ PO TBCR
40.0000 meq | EXTENDED_RELEASE_TABLET | Freq: Once | ORAL | Status: AC
  Administered 2017-10-30: 40 meq via ORAL
  Filled 2017-10-30: qty 2

## 2017-10-30 MED ORDER — MAGNESIUM SULFATE 2 GM/50ML IV SOLN
2.0000 g | Freq: Once | INTRAVENOUS | Status: AC
  Administered 2017-10-30: 2 g via INTRAVENOUS
  Filled 2017-10-30: qty 50

## 2017-10-30 MED ORDER — POTASSIUM CHLORIDE 10 MEQ/100ML IV SOLN
10.0000 meq | INTRAVENOUS | Status: AC
  Administered 2017-10-30 (×2): 10 meq via INTRAVENOUS
  Filled 2017-10-30 (×2): qty 100

## 2017-10-30 MED ORDER — HALOPERIDOL LACTATE 5 MG/ML IJ SOLN: 5.0000 mg | Freq: Once | INTRAMUSCULAR | Status: DC | PRN

## 2017-10-30 NOTE — Progress Notes (Signed)
Paged Dr. Myna Hidalgo again re: pacemaker at bedside and order d/c'd.

## 2017-10-30 NOTE — Progress Notes (Signed)
  Speech Language Pathology Treatment: Dysphagia  Patient Details Name: Mario May MRN: [id number] DOB: 1914/12/05 Today's Date: 10/30/2017 Time: 2233-6122 SLP Time Calculation (min) (ACUTE ONLY): 14 min  Assessment / Plan / Recommendation Clinical Impression  Patient seen for diet tolerance; per RN intake has been minimal. Aphasia appears to have resolved; pt immediately engaged SLP in conversation and stated he dislikes pureed food. Provided advanced trials of thin liquids and repeated 3 oz water swallow challenge, which pt tolerates without overt signs of aspiration. Mastication of regular cracker was mildly prolonged, with intermittent oral holding largely due to pt's distractibility while attempting to converse with SLP. No overt signs of aspiration observed, and when pt is focusing on intake his oral stage is functional with adequate clearance. Recommend advancement to dys 2, thin liquids, with intermittent supervision. Continue medications whole in puree. Will f/u for tolerance and advancement of solids.     HPI HPI: 84 y.o.malewho was at a GI appointment about bloating when he developed acute onset facial weakness and aphasia. He actually improved en route and subsequently re-worsened. Initially, he was not given IV tPA due to mild symptoms, but when he worsened within the window he was treated.       SLP Plan  Continue with current plan of care       Recommendations  Diet recommendations: Dysphagia 2 (fine chop);Thin liquid Liquids provided via: Cup;Straw Medication Administration: Whole meds with puree Supervision: Patient able to self feed;Intermittent supervision to cue for compensatory strategies Compensations: Minimize environmental distractions                Oral Care Recommendations: Oral care BID Follow up Recommendations: Other (comment)(tba) SLP Visit Diagnosis: Dysphagia, unspecified (R13.10) Plan: Continue with current plan of care        [place], [place], [place] Speech-Language Pathologist [phone]   Mario May 10/30/2017, 5:22 PM

## 2017-10-30 NOTE — Progress Notes (Signed)
STROKE TEAM PROGRESS NOTE   HISTORY OF PRESENT ILLNESS (per record) Mario May is a 84 y.o. male who was at a GI appointment about bloating when he developed acute onset facial weakness and aphasia. He actually improved en route and subsequently re-worsened. Initially, he was not given IV tPA due to mild symptoms, but when he worsened within the window he was treated. CTA with no LVO. At baseline, he has some mild memory problems, but he and his wife still live alone together and take care of their own ADLs.   LKW: 11:30 tpa given?: yes Modified Rankin Scale: 1-No significant post stroke disability and can perform usual duties with stroke symptoms     SUBJECTIVE (INTERVAL HISTORY) No family members present. The patient voices no complaints. Inpatient rehabilitation as planned. He did not tolerate the MRI secondary to neck pain. Another attempt will be made today if he cooperates.   OBJECTIVE Temp:  [97.3 F (36.3 C)-97.8 F (36.6 C)] 97.8 F (36.6 C) (02/10 1018) Pulse Rate:  [42-84] 42 (02/10 1018) Cardiac Rhythm: Atrial fibrillation (02/10 0700) Resp:  [18-19] 18 (02/10 1018) BP: (138-162)/(64-87) 162/65 (02/10 1018) SpO2:  [97 %-100 %] 97 % (02/10 1018)  CBC:  Recent Labs  Lab 10/28/17 1300 10/30/17 0045  WBC 6.9 5.7  NEUTROABS 4.1 3.9  HGB 12.4* 12.3*  HCT 38.0* 36.2*  MCV 96.9 96.5  PLT 183 267    Basic Metabolic Panel:  Recent Labs  Lab 10/28/17 1300 10/30/17 0045  NA 136 140  K 3.8 2.9*  CL 99* 109  CO2 20* 19*  GLUCOSE 100* 99  BUN 31* 21*  CREATININE 1.58* 1.00  CALCIUM 9.2 8.6*  MG  --  1.6*    Lipid Panel:     Component Value Date/Time   CHOL 191 10/29/2017 0517   TRIG 92 10/29/2017 0517   HDL 43 10/29/2017 0517   CHOLHDL 4.4 10/29/2017 0517   VLDL 18 10/29/2017 0517   LDLCALC 130 (H) 10/29/2017 0517   HgbA1c:  Lab Results  Component Value Date   HGBA1C 5.9 (H) 10/29/2017   Urine Drug Screen: No results found for: LABOPIA,  COCAINSCRNUR, LABBENZ, AMPHETMU, THCU, LABBARB  Alcohol Level No results found for: [facility]  IMAGING  CT head without contrast 10/29/2017  Ct Angio Head W Or Wo Contrast Ct Angio Neck W Or Wo Contrast 10/28/2017 IMPRESSION:  1. New high-grade stenosis or occlusion of the right vertebral artery at the C1-2 level.  2. High-grade stenosis of the right vertebral artery at the dural margin.  3. Extensive distal small vessel disease is otherwise stable.  4. No new significant proximal stenosis or occlusion in the anterior circulation.  5. Atherosclerotic disease at the aortic arch and bilateral carotid bifurcations is stable.    Ct Head Wo Contrast 10/28/2017 IMPRESSION:  Severely motion degraded.  No gross hemorrhage or change from prior.   Ct Head Wo Contrast 10/29/2017 IMPRESSION: Stable compared to admission head CT.No hemorrhage or visible infarct.   Ct Head Code Stroke Wo Contrast 10/28/2017 IMPRESSION:  1. No evidence of acute intracranial abnormality.  2. ASPECTS is 10.  3. Moderate chronic small vessel ischemic disease.     Transthoracic Echocardiogram  10/30/2017 Study Conclusions  - Left ventricle: The cavity size was normal. Wall thickness was   normal. Systolic function was normal. The estimated ejection   fraction was in the range of 55% to 60%. The study is not   technically sufficient to allow evaluation of  LV diastolic   function. - Mitral valve: Nodular calcification of mitral leaflet tips   Calcified annulus. Mildly thickened leaflets . - Left atrium: The atrium was mildly dilated. - Right atrium: The atrium was mildly dilated.    PHYSICAL EXAM Vitals:   10/29/17 2118 10/30/17 0300 10/30/17 0525 10/30/17 1018  BP: (!) 162/72 138/64 (!) 162/67 (!) 162/65  Pulse: (!) 52 69 (!) 49 (!) 42  Resp: 19 18 18 18   Temp: 97.7 F (36.5 C) (!) 97.3 F (36.3 C) 97.7 F (36.5 C) 97.8 F (36.6 C)  TempSrc: Oral Oral Oral Oral  SpO2: 100% 100% 100% 97%  Weight:       Height:       Frail malnourished looking elderly caucasian male  . Afebrile. Head is nontraumatic. Neck is supple without bruit.    Cardiac exam no murmur or gallop. Lungs are clear to auscultation. Distal pulses are well felt. Neurological Exam ;  Awake  Alert oriented x 3. Patient is extremely hard of hearing Normal speech and language.eye movements full without nystagmus.fundi were not visualized. Vision acuity and fields appear normal. Hearing is poor bilaterally Palatal movements are normal. Face symmetric. Tongue midline. Normal strength, tone, reflexes and coordination. Normal sensation. Gait deferred.    ASSESSMENT/PLAN [name] is a 84 y.o. male with history of hearing impairment, permanent atrial fibrillation, bradycardia, hypertension, anemia, memory problems and hypothyroidism  presenting with facial weakness and aphasia. The patient received IV TPA on Friday, 10/28/2017 at 1230.  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  Resultant  no focal deficits   CT head - Severely motion degraded.  CT Head - F/U TPA - Stable compared to admission head CT.No hemorrhage or visible infarct. Start ASA.  MRI head - pending  MRA head - not performed  CTA H&N - New high-grade stenosis or occlusion of the right vertebral artery at the C1-2 level. High-grade stenosis of the right vertebral artery at the dural margin.   Carotid Doppler - CTA neck  2D Echo - EF 55-60%. No cardiac source of emboli identified.  LDL - 130  HgbA1c - 5.9  VTE prophylaxis - SCDs Check puncture sites for bleeding or hematomas. Bleeding precautions Fall precautions DIET - DYS 1 Room service appropriate? Yes; Fluid consistency: Nectar Thick  aspirin 325 mg daily prior to admission, now on No antithrombotic S/P TPA  Ongoing aggressive stroke risk factor management  Therapy recommendations:  Inpatient rehabilitation recommended.  Disposition:   Pending  Hypertension  Stable  Permissive hypertension (OK if < 220/120) but gradually normalize in 5-7 days  Long-term BP goal normotensive  Hyperlipidemia  Home meds: No lipid lowering medications prior to admission  LDL 130, goal < 70  Add Pravachol 20 mg daily  Continue statin at discharge   Other Stroke Risk Factors  Advanced age  Former cigarette smoker - quit almost 60 years ago.  Atrial fibrillation - not anticoagulated    Other Active Problems  Multiple food allergies  Bradycardia - (Will check TSH -> 2.35 WNL).  Discussed bradycardia with cardiology. No plans to treat bradycardia that occurs while sleeping. Would consider pacemaker only for symptomatic bradycardia while awake.   Hearing-impaired  Elevated BUN and creatinine -> improving - possibly dehydrated.  Hypokalemia - 2.9 -> supplemented -> repeat labs pending   Plan / Recommendations   Stroke workup - MRI pending  Inpatient rehabilitation planned   Hospital day # 2  Mikey Bussing PA-C [facility] Pager [phone] 10/30/2017, 12:49 PM    I have personally examined this patient, reviewed notes, independently viewed imaging studies, participated in medical decision making and plan of care.ROS completed by me personally and pertinent positives fully documented  I have made any additions or clarifications directly to the above note. Agree with note above. Time mobilize out of bed and therapy consults. Check MRI if patient can't cooperate. Transfer to inpatient rehabilitation in the next few days.  Antony Contras, MD Medical Director [facility] Pager: [phone] 10/30/2017 1:41 PM         To contact Stroke Continuity provider, please refer to [email or web address] After hours, contact General Neurology

## 2017-10-30 NOTE — Progress Notes (Signed)
CCMD called- pt. had a 2.52 sec. pause; no problems noted and pt. Sleeping; Dr. Mike Craze paged [phone] and RTC; will written and will have Medical to see pt.; also informed about pause at 10/29/17 2250.

## 2017-10-30 NOTE — Progress Notes (Signed)
*  PRELIMINARY RESULTS* Echocardiogram 2D Echocardiogram has been performed.  Leavy Cella 10/30/2017, 9:59 AM

## 2017-10-30 NOTE — Evaluation (Signed)
Occupational Therapy Evaluation Patient Details Name: Mario May MRN: [id number] DOB: July 18, 1915 Today's Date: 10/30/2017    History of Present Illness 84 y.o. male who was at a GI appointment about bloating when he developed acute onset facial weakness and aphasia. He actually improved en route and subsequently re-worsened. Initially, he was not given IV tPA due to mild symptoms, but when he worsened within the window he was treated   Clinical Impression   Pt admitted with the above diagnoses and presents with below problem list. Pt will benefit from continued acute OT to address the below listed deficits and maximize independence with basic ADLs prior to d/c to venue below. PTA pt was mod I with ADLs. Pt is currently min - mod A +2 for functional mobility/transfers within the room and LB ADLs. Multiple family members present on eval.      Follow Up Recommendations  CIR;Supervision/Assistance - 24 hour    Equipment Recommendations  Other (comment)(defer to next venue)    Recommendations for Other Services       Precautions / Restrictions Precautions Precautions: Fall Restrictions Weight Bearing Restrictions: No  Pt is very HOH.     Mobility Bed Mobility Overal bed mobility: Needs Assistance Bed Mobility: Supine to Sit     Supine to sit: Mod assist     General bed mobility comments: moderate assist to pull trunk to upright   Transfers Overall transfer level: Needs assistance Equipment used: Rolling walker (2 wheeled) Transfers: Sit to/from Stand Sit to Stand: Mod assist         General transfer comment: from EOB. assist to powerup. +2 utilized. Pt less anxious with someone on each side of him.    Balance Overall balance assessment: Needs assistance   Sitting balance-Leahy Scale: Fair Sitting balance - Comments: posterior bias noted Postural control: Posterior lean   Standing balance-Leahy Scale: Poor Standing balance comment: external support for  balance                           ADL either performed or assessed with clinical judgement   ADL Overall ADL's : Needs assistance/impaired Eating/Feeding: Set up;Sitting   Grooming: Set up;Sitting   Upper Body Bathing: Set up;Sitting;Minimal assistance   Lower Body Bathing: Moderate assistance;+2 for physical assistance   Upper Body Dressing : Minimal assistance;Set up;Sitting   Lower Body Dressing: +2 for physical assistance;Sit to/from stand;Moderate assistance   Toilet Transfer: Minimal assistance;+2 for physical assistance;Ambulation;RW   Toileting- Clothing Manipulation and Hygiene: +2 for physical assistance;Sit to/from stand;Minimal assistance   Tub/ Shower Transfer: Moderate assistance;Ambulation;+2 for physical assistance;Shower seat;Rolling walker   Functional mobility during ADLs: Minimal assistance;Moderate assistance;+2 for physical assistance;Rolling walker General ADL Comments: In-room functional mobility from opposite side of bed to recliner. Anxious with mobility.      Vision         Perception     Praxis      Pertinent Vitals/Pain Pain Assessment: Faces Faces Pain Scale: Hurts little more Pain Location: back/neck region Pain Descriptors / Indicators: Grimacing Pain Intervention(s): Monitored during session;Repositioned     Hand Dominance Right   Extremity/Trunk Assessment Upper Extremity Assessment Upper Extremity Assessment: Generalized weakness   Lower Extremity Assessment Lower Extremity Assessment: Defer to PT evaluation       Communication Communication Communication: Expressive difficulties;HOH   Cognition Arousal/Alertness: Awake/alert Behavior During Therapy: WFL for tasks assessed/performed;Anxious Overall Cognitive Status: Difficult to assess Area of Impairment: Attention;Following commands;Awareness;Problem solving  Current Attention Level: Sustained   Following Commands: Follows one step  commands consistently   Awareness: Intellectual;Emergent Problem Solving: Slow processing;Requires verbal cues;Requires tactile cues     General Comments       Exercises     Shoulder Instructions      Home Living Family/patient expects to be discharged to:: Private residence Living Arrangements: Spouse/significant other Available Help at Discharge: Family;Available PRN/intermittently Type of Home: House Home Access: Stairs to enter [employer] of Steps: 1 Entrance Stairs-Rails: None Home Layout: One level     Bathroom Shower/Tub: Teacher, early years/pre: Handicapped height     Home Equipment: Environmental consultant - 2 wheels;Shower seat          Prior Functioning/Environment Level of Independence: Independent with assistive device(s)                 OT Problem List: Impaired balance (sitting and/or standing);Decreased activity tolerance;Decreased knowledge of use of DME or AE;Decreased knowledge of precautions      OT Treatment/Interventions: Self-care/ADL training;Energy conservation;DME and/or AE instruction;Therapeutic activities;Patient/family education;Balance training    OT Goals(Current goals can be found in the care plan section) Acute Rehab OT Goals Patient Stated Goal: none stated OT Goal Formulation: With patient/family Time For Goal Achievement: 11/13/17 Potential to Achieve Goals: Good ADL Goals Pt Will Perform Grooming: with modified independence;sitting;standing Pt Will Perform Upper Body Bathing: with modified independence;sitting Pt Will Perform Lower Body Bathing: with supervision;sit to/from stand Pt Will Perform Upper Body Dressing: with modified independence;sitting Pt Will Perform Lower Body Dressing: with supervision;sit to/from stand Pt Will Transfer to Toilet: with min guard assist;ambulating Pt Will Perform Toileting - Clothing Manipulation and hygiene: with supervision;sit to/from stand  OT Frequency: Min 2X/week    Barriers to D/C:            Co-evaluation              AM-PAC PT "6 Clicks" Daily Activity     Outcome Measure Help from another person eating meals?: None Help from another person taking care of personal grooming?: A Little Help from another person toileting, which includes using toliet, bedpan, or urinal?: A Lot Help from another person bathing (including washing, rinsing, drying)?: A Lot Help from another person to put on and taking off regular upper body clothing?: A Little Help from another person to put on and taking off regular lower body clothing?: A Lot 6 Click Score: 16   End of Session Equipment Utilized During Treatment: Gait belt;Rolling walker Nurse Communication: Mobility status  Activity Tolerance: Patient tolerated treatment well Patient left: in chair;with call bell/phone within reach;with chair alarm set;with family/visitor present  OT Visit Diagnosis: Unsteadiness on feet (R26.81);Muscle weakness (generalized) (M62.81)                Time: 2353-6144 OT Time Calculation (min): 22 min Charges:  OT General Charges $OT Visit: 1 Visit OT Evaluation $OT Eval Low Complexity: 1 Low G-Codes:       Hortencia Pilar 10/30/2017, 1:42 PM

## 2017-10-30 NOTE — Progress Notes (Signed)
On-call cross cover note Called by the nurse to report that patient had 2 pauses on his telemetry- 2.52 seconds and 2.36 seconds.  Sleeping and asymptomatic.  On chart review he has been seen by cardiology for symptomatic bradycardia in the past.  He has a history of atrial fibrillation and was not started on anticoagulation because of falls which were related to his bradycardia. I have requested a hospitalist consultation for assistance with the above said.  I spoke with Dr. Myna Hidalgo from [facility], who will see the patient at his next convenience. I appreciate Dr. Criss Rosales assistance in the care of this patient.  -- Amie Portland, MD [facility] Pager: [phone] If 7pm to 7am, please call on call as listed on AMION.

## 2017-10-30 NOTE — Progress Notes (Signed)
CCMD called - pt. had 3.65 pause; pt. sleeping; Dr. Myna Hidalgo paged- orders placed

## 2017-10-30 NOTE — Evaluation (Signed)
Speech Language Pathology Evaluation Patient Details Name: Mario May MRN: [id number] DOB: 1915/02/03 Today's Date: 10/30/2017 Time: 1700-1715 SLP Time Calculation (min) (ACUTE ONLY): 15 min  Problem List:  Patient Active Problem List   Diagnosis Date Noted  . Stroke (cerebrum) ([place]) 10/28/2017  . CAP (community acquired pneumonia) 08/21/2017  . Neck pain 08/21/2017  . Bradycardia 01/23/2017  . Atrial fibrillation with slow ventricular response ([place]) 01/23/2017  . Dehydration 08/19/2016  . Elevated troponin 08/18/2016  . AKI (acute kidney injury) ([place]) 08/18/2016  . Difficulty in walking, not elsewhere classified   . Gait instability   . Left leg cellulitis 06/05/2016  . Orthostatic hypotension 06/04/2016  . 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 Lumbar foraminal stenosis 06/04/2016  . DJD (degenerative joint disease), multiple sites 06/04/2016  . Fever 06/04/2016  . Leukocytosis 06/04/2016  . Stasis dermatitis of both legs 07/01/2013  . Essential hypertension 03/15/2013  . Hypothyroidism, adult 03/18/2009  . CONSTIPATION 03/18/2009  . CHANGE IN BOWELS 03/18/2009   Past Medical History:  Past Medical History:  Diagnosis Date  . Anemia   . Arthritis    "just my legs" (04/06/2013)  . Assistance needed for mobility    walks with walker  . Atrial fibrillation ([place])   . Constipation   . Gout   . HOH (hard of hearing)   . Hyperlipidemia   . Hypertension   . Hypothyroidism   . Kidney stones    "only once" (04/06/2013)  . Lumbar foraminal stenosis 06/04/2016  . 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 Rheumatism   . SIRS (systemic inflammatory response syndrome) ([place]) 02/2013   [name] 03/19/2013  (04/06/2013)  . Small bowel obstruction [facility])    Past Surgical History:  Past Surgical History:  Procedure Laterality Date  . CYSTOSCOPY W/ STONE MANIPULATION  ?1986  . NASAL FRACTURE SURGERY    . TRANSURETHRAL RESECTION OF PROSTATE     HPI:   84 y.o.malewho was at a GI appointment about bloating when he developed acute onset facial weakness and aphasia. He actually improved en route and subsequently re-worsened. Initially, he was not given IV tPA due to mild symptoms, but when he worsened within the window he was treated.    Assessment / Plan / Recommendation Clinical Impression   Patient's aphasia appears to have resolved. While he does have difficulty following multi-step commands, this appears due to his hearing loss vs language comprehension. Pt talkative and eager to tell SLP stories about his experiences surrounding his admission. His memory of events this admission is quite good, though he does have difficulty recalling specific details. No wordfinding deficits or paraphasias noted in pt's conversational speech, however there are frequent dysfluencies, primarily part and whole word repetitions which do not significantly interfere with his communication. Pt reports this is baseline for "the last 3-4 years." No family present to confirm prior function. SLP will follow acutely for diagnostic treatment of cognitive-linguistics and to establish pt's baseline.     SLP Assessment  SLP Recommendation/Assessment: Patient needs continued Speech Lanaguage Pathology Services SLP Visit Diagnosis: Cognitive communication deficit (R41.841)    Follow Up Recommendations  Other (comment)(tba)    Frequency and Duration min 2x/week  2 weeks      SLP Evaluation Cognition  Overall Cognitive Status: No family/caregiver present to determine baseline cognitive functioning Arousal/Alertness: Awake/alert Orientation Level: Oriented to person;Oriented to place;Oriented to time;Oriented to situation Attention: Focused;Sustained Focused Attention: Appears intact Sustained Attention: Impaired Sustained Attention Impairment: Functional basic Memory: Impaired Memory  Impairment: Decreased short term memory(functional recall impaired, however pt HOH  may be a factor) Decreased Short Term Memory: Functional basic       Comprehension  Auditory Comprehension Overall Auditory Comprehension: Impaired Yes/No Questions: Within Functional Limits Commands: Impaired One Step Basic Commands: 75-100% accurate Two Step Basic Commands: 75-100% accurate(75% ) Conversation: Simple Interfering Components: Attention;Hearing EffectiveTechniques: Increased volume;Pausing;Repetition;Stressing words;Visual/Gestural cues Visual Recognition/Discrimination Discrimination: Within Function Limits Reading Comprehension Reading Status: Not tested    Expression Expression Primary Mode of Expression: Verbal Verbal Expression Overall Verbal Expression: Appears within functional limits for tasks assessed Initiation: No impairment Automatic Speech: Name;Social Response Level of Generative/Spontaneous Verbalization: Conversation Repetition: Impaired Level of Impairment: Word level(Hearing vs language) Naming: No impairment(confrontation naming 10/10) Pragmatics: No impairment Written Expression Dominant Hand: Right Written Expression: Not tested   Oral / Motor  Oral Motor/Sensory Function Overall Oral Motor/Sensory Function: Within functional limits Motor Speech Overall Motor Speech: Impaired(dysfluencies: syllable, part/whole word, phrase repetitions) Respiration: Within functional limits Phonation: Normal Resonance: Within functional limits Articulation: Within functional limitis Intelligibility: Intelligible Motor Planning: Witnin functional limits Motor Speech Errors: Inconsistent;[place], [place], [place] Speech-Language Pathologist [place] 10/30/2017, 5:37 PM

## 2017-10-30 NOTE — Progress Notes (Signed)
RN paged Dr. Myna Hidalgo and informed him that this unit can not have pacemaker/defib at bedside; Dr. on floor to see pt.

## 2017-10-30 NOTE — Plan of Care (Signed)
  Education: Knowledge of disease or condition will improve 10/30/2017 0130 - Progressing by Anson Fret, RN Note POC reviewed with pt.; pt. forgetful

## 2017-10-30 NOTE — Consult Note (Signed)
Medical Consultation   Mario May  FTD:[id number]  DOB: Aug 01, 1915  DOA: 10/28/2017  PCP: Dione Housekeeper, MD   Outpatient Specialists: Dr. Carlean Purl (GI), Dr. Lovena Le (cardiology/EP)   Requesting physician: Dr. Rory Percy (neurology)   Reason for consultation: Marked bradycardia, cardiac pauses   History of Present Illness: Mario May is an 84 y.o. male with history of persistent atrial fibrillation not anticoagulated due to recurrent falls, hypertension, hypothyroidism, and chronic diastolic CHF, admitted to the hospital on 10/28/2017 with acute CVA and treated with tPA, now with marked bradycardia and pauses on cardiac monitoring.  Patient saw his gastroenterologist in the clinic on 10/28/2017, was noted to develop aphasia and right facial droop, and was sent to the ED with EMS.  He began to improve, but then re-worsened while still within the window for tPA and was treated. He was briefly treated with nicardipine infusion for BP-control but has not received this in >36 hours.   He was noted to have marked bradycardia while sleeping at night of his admission, and again tonight with cardiac pauses of ~2.5 seconds noted on the monitor.  Patient has been asymptomatic with this.  He has history of atrial fibrillation with slow ventricular response and heart rate in the 40s-50s, sometimes dipping into the 30s going back more than a year.  He was evaluated for this in the hospital in May 2018 and saw cardiology in consultation after discharge.  Given that he is generally sedentary and has been asymptomatic, cardiology favored monitoring at that time.   Review of Systems:  ROS As per HPI otherwise 10 point review of systems negative.    Past Medical History: Past Medical History:  Diagnosis Date  . Anemia   . Arthritis    "just my legs" (04/06/2013)  . Assistance needed for mobility    walks with walker  . Atrial fibrillation ([place])   . Constipation   . Gout   . HOH  (hard of hearing)   . Hyperlipidemia   . Hypertension   . Hypothyroidism   . Kidney stones    "only once" (04/06/2013)  . Lumbar foraminal stenosis 06/04/2016  . 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 Rheumatism   . SIRS (systemic inflammatory response syndrome) ([place]) 02/2013   [name] 03/19/2013  (04/06/2013)  . Small bowel obstruction [facility])     Past Surgical History: Past Surgical History:  Procedure Laterality Date  . CYSTOSCOPY W/ STONE MANIPULATION  ?1986  . NASAL FRACTURE SURGERY    . TRANSURETHRAL RESECTION OF PROSTATE       Allergies:   Allergies  Allergen Reactions  . Codeine Other (See Comments)    Passes out  . Grapefruit Concentrate Other (See Comments)    unknown  . Other Other (See Comments)    Potatoes, green beans, corn - unknown  . Pineapple Other (See Comments)    unknown     Social History:  reports that he quit smoking about 59 years ago. His smoking use included cigarettes. He smoked 0.50 packs per day. he has never used smokeless tobacco. He reports that he does not drink alcohol or use drugs.   Family History: Family History  Problem Relation Age of Onset  . Colon cancer Mother   . Prostate cancer Father   . Heart disease Father   . Colon polyps Brother        x 2  . Heart disease  Brother      Physical Exam: Vitals:   10/29/17 1500 10/29/17 1547 10/29/17 1700 10/29/17 2118  BP: (!) 151/74 (!) 151/74 (!) 150/70 (!) 162/72  Pulse: 84 84 80 (!) 52  Resp:  18 18 19   Temp: (!) 97.5 F (36.4 C) (!) 97.5 F (36.4 C) (!) 97.3 F (36.3 C) 97.7 F (36.5 C)  TempSrc: Oral Oral Oral Oral  SpO2: 100% 100% 99% 100%  Weight:      Height:        Constitutional: Alert and awake, oriented x3, not in any acute distress. Eyes: PERLA, EOMI, irises appear normal, anicteric sclera,  ENMT: external ears and nose appear normal            Lips appears normal, oropharynx mucosa, tongue, posterior pharynx appear normal  Neck: neck appears  normal, no masses, normal ROM, no thyromegaly, no JVD  CVS: Rate ~45 and irregular, no LE edema, no JVD  Respiratory:  clear to auscultation bilaterally, no wheezing, rales or rhonchi. Respiratory effort normal. No accessory muscle use.  Abdomen: soft nontender, nondistended, normal bowel sounds, no hepatosplenomegaly, no hernias  Musculoskeletal: : no cyanosis, clubbing or edema noted bilaterally Neuro: Cranial nerves II-XII intact, gross hearing deficit Psych: judgement and insight appear normal, stable mood and affect, mental status Skin: no rashes or lesions or ulcers, no induration or nodules   Data reviewed:  I have personally reviewed following labs and imaging studies Labs:  CBC: Recent Labs  Lab 10/28/17 1206 10/28/17 1300  WBC  --  6.9  NEUTROABS  --  4.1  HGB 13.6 12.4*  HCT 40.0 38.0*  MCV  --  96.9  PLT  --  976    Basic Metabolic Panel: Recent Labs  Lab 10/28/17 1206 10/28/17 1300  NA 136 136  K 4.1 3.8  CL 99* 99*  CO2  --  20*  GLUCOSE 103* 100*  BUN 41* 31*  CREATININE 1.50* 1.58*  CALCIUM  --  9.2   GFR Estimated Creatinine Clearance: 25 mL/min (A) (by C-G formula based on SCr of 1.58 mg/dL (H)). Liver Function Tests: Recent Labs  Lab 10/28/17 1300  AST 29  ALT 10*  ALKPHOS 128*  BILITOT 1.4*  PROT 7.0  ALBUMIN 3.6   No results for input(s): LIPASE, AMYLASE in the last 168 hours. No results for input(s): AMMONIA in the last 168 hours. Coagulation profile Recent Labs  Lab 10/28/17 1300  INR 1.07    Cardiac Enzymes: No results for input(s): CKTOTAL, CKMB, CKMBINDEX, TROPONINI in the last 168 hours. BNP: Invalid input(s): POCBNP CBG: Recent Labs  Lab 10/28/17 1213  GLUCAP 92   D-Dimer No results for input(s): DDIMER in the last 72 hours. Hgb A1c Recent Labs    10/29/17 0517  HGBA1C 5.9*   Lipid Profile Recent Labs    10/29/17 0517  CHOL 191  HDL 43  LDLCALC 130*  TRIG 92  CHOLHDL 4.4   Thyroid function studies No  results for input(s): TSH, T4TOTAL, T3FREE, THYROIDAB in the last 72 hours.  Invalid input(s): FREET3 Anemia work up No results for input(s): VITAMINB12, FOLATE, FERRITIN, TIBC, IRON, RETICCTPCT in the last 72 hours. Urinalysis    Component Value Date/Time   COLORURINE YELLOW 10/06/2017 0105   APPEARANCEUR CLEAR 10/06/2017 0105   LABSPEC 1.018 10/06/2017 0105   PHURINE 5.0 10/06/2017 0105   GLUCOSEU NEGATIVE 10/06/2017 0105   HGBUR NEGATIVE 10/06/2017 0105   BILIRUBINUR NEGATIVE 10/06/2017 0105   KETONESUR NEGATIVE 10/06/2017  0105   PROTEINUR NEGATIVE 10/06/2017 0105   UROBILINOGEN 0.2 10/02/2013 2114   NITRITE NEGATIVE 10/06/2017 0105   LEUKOCYTESUR SMALL (A) 10/06/2017 0105     Microbiology Recent Results (from the past 240 hour(s))  MRSA PCR Screening     Status: None   Collection Time: 10/28/17  3:33 PM  Result Value Ref Range Status   MRSA by PCR NEGATIVE NEGATIVE Final    Comment:        The GeneXpert MRSA Assay (FDA approved for NASAL specimens only), is one component of a comprehensive MRSA colonization surveillance program. It is not intended to diagnose MRSA infection nor to guide or monitor treatment for MRSA infections. Performed at [facility], [place] [address] [place], [place] [address]        Inpatient Medications:   Scheduled Meds: .  stroke: mapping our early stages of recovery book   Does not apply Once  . allopurinol  100 mg Oral Daily  . aspirin EC  81 mg Oral Daily  . finasteride  5 mg Oral Daily  . isosorbide dinitrate  10 mg Oral BID  . levothyroxine  50 mcg Oral QAC breakfast  . pantoprazole  40 mg Oral Q0600  . pravastatin  20 mg Oral q1800   Continuous Infusions: . sodium chloride Stopped (10/29/17 1100)  . niCARDipine Stopped (10/28/17 2000)     Radiological Exams on Admission: Ct Angio Head W Or Wo Contrast  Result Date: 10/28/2017 CLINICAL DATA:  Focal neuro deficit greater than 6 hours, stroke suspected. The patient  initially had aphasia which improved. It then became worse. TPA was then given. There is also right-sided facial droop and right-sided weakness. EXAM: CT ANGIOGRAPHY HEAD AND NECK TECHNIQUE: Multidetector CT imaging of the head and neck was performed using the standard protocol during bolus administration of intravenous contrast. Multiplanar CT image reconstructions and MIPs were obtained to evaluate the vascular anatomy. Carotid stenosis measurements (when applicable) are obtained utilizing NASCET criteria, using the distal internal carotid diameter as the denominator. CONTRAST:  50 mL Isovue 370 COMPARISON:  CT head without contrast from the same day. CTA head and neck 01/23/2017. FINDINGS: A 3 vessel arch configuration is present. CTA NECK FINDINGS Aortic arch: A 3 vessel arch configuration is present. Atherosclerotic calcifications at the aortic arch are stable. There is no significant stenosis or aneurysm. Right carotid system: The right common carotid artery is within normal limits. Atherosclerotic changes at the right carotid bifurcation are stable. There is no significant stenosis relative to the more distal vessel. Mild tortuosity is present without significant stenosis. Left carotid system: The left common carotid artery is within normal limits. Atherosclerotic calcifications are again noted at the carotid bifurcation. There is noncalcified plaque as well. Atherosclerotic disease is more profound than on the right. There is no significant stenosis relative to the more distal vessel. Cervical left ICA is otherwise normal. Vertebral arteries: The left vertebral artery is the dominant vessel. Both vertebral arteries originate from the subclavian arteries. There is a moderate proximal stenosis of the non dominant right vertebral artery. There is mild irregularity throughout the right vertebral. The right vertebral artery is occluded at the C1-2 level. Contrast above this level is likely retrograde. Skeleton:  Stable. Osseous foraminal narrowing is present multiple levels, greatest at C4-5. No focal lytic or blastic lesions are present. No acute or healing fractures are present. Other neck: The soft tissues of the neck are otherwise unremarkable. No significant adenopathy is present. Salivary glands  are within normal limits. Thyroid is unremarkable. Upper chest: Mild dependent atelectasis is present. The thoracic inlet is within normal limits. The upper mediastinum is unremarkable. Review of the MIP images confirms the above findings CTA HEAD FINDINGS Anterior circulation: Cavernous carotid atherosclerotic calcifications are present bilaterally without a significant stenosis. The ICA terminus is normal bilaterally. The A1 and M1 segments are normal. Anterior communicating artery is patent. Moderate diffuse small vessel irregularity is present without a significant proximal occlusion or stenosis. There is no significant interval change. Posterior circulation: Previously noted high-grade stenosis of the intracranial right vertebral artery is again seen. There is near complete occlusion now at the dural margin. The left vertebral artery is unchanged without a significant stenosis. Mild atherosclerotic changes are present. The proximal basilar artery is normal. The right AICA and PICA vessels opacify. Both posterior cerebral arteries originate from the basilar tip. A right posterior communicating artery is present. Moderate narrowing of distal PCA branch vessels is stable. Venous sinuses: The dural sinuses are patent. Straight sinus and deep cerebral veins are intact. Cortical veins are unremarkable. Anatomic variants: None. Delayed phase: Not performed in the emergent setting. Review of the MIP images confirms the above findings IMPRESSION: 1. New high-grade stenosis or occlusion of the right vertebral artery at the C1-2 level. 2. High-grade stenosis of the right vertebral artery at the dural margin. 3. Extensive distal  small vessel disease is otherwise stable. 4. No new significant proximal stenosis or occlusion in the anterior circulation. 5. Atherosclerotic disease at the aortic arch and bilateral carotid bifurcations is stable. The above was relayed via text pager to Dr. Roland Rack on 10/28/2017 at 14:14 . Electronically Signed   By: San Morelle M.D.   On: 10/28/2017 14:14   Ct Head Wo Contrast  Result Date: 10/29/2017 CLINICAL DATA:  24 hour follow-up after tPA for stroke. EXAM: CT HEAD WITHOUT CONTRAST TECHNIQUE: Contiguous axial images were obtained from the base of the skull through the vertex without intravenous contrast. COMPARISON:  Head CT from yesterday FINDINGS: Brain: No evidence of acute infarction, hemorrhage, hydrocephalus, extra-axial collection or mass lesion/mass effect. Atrophy and moderate chronic small vessel ischemia in the cerebral white matter. Vascular: No hyperdense vessel.  Atherosclerotic calcification. Skull: Normal. Negative for fracture or focal lesion. Sinuses/Orbits: Negative IMPRESSION: Stable compared to admission head CT.No hemorrhage or visible infarct. Electronically Signed   By: Monte Fantasia M.D.   On: 10/29/2017 12:12   Ct Head Wo Contrast  Result Date: 10/28/2017 CLINICAL DATA:  Headache after tPA.  Evaluate for hemorrhage. EXAM: CT HEAD WITHOUT CONTRAST TECHNIQUE: Contiguous axial images were obtained from the base of the skull through the vertex without intravenous contrast. COMPARISON:  Head CT and CTA from earlier today. FINDINGS: Severely motion degraded. No gross hemorrhage. Only the largest infarct could be confidently visualized. No hydrocephalus or visible shift. There is atrophy and chronic white matter disease. Grossly negative bones and soft tissues. IMPRESSION: Severely motion degraded.  No gross hemorrhage or change from prior. Electronically Signed   By: Monte Fantasia M.D.   On: 10/28/2017 14:02   Ct Angio Neck W Or Wo Contrast  Result Date:  10/28/2017 CLINICAL DATA:  Focal neuro deficit greater than 6 hours, stroke suspected. The patient initially had aphasia which improved. It then became worse. TPA was then given. There is also right-sided facial droop and right-sided weakness. EXAM: CT ANGIOGRAPHY HEAD AND NECK TECHNIQUE: Multidetector CT imaging of the head and neck was performed using the standard protocol  during bolus administration of intravenous contrast. Multiplanar CT image reconstructions and MIPs were obtained to evaluate the vascular anatomy. Carotid stenosis measurements (when applicable) are obtained utilizing NASCET criteria, using the distal internal carotid diameter as the denominator. CONTRAST:  50 mL Isovue 370 COMPARISON:  CT head without contrast from the same day. CTA head and neck 01/23/2017. FINDINGS: A 3 vessel arch configuration is present. CTA NECK FINDINGS Aortic arch: A 3 vessel arch configuration is present. Atherosclerotic calcifications at the aortic arch are stable. There is no significant stenosis or aneurysm. Right carotid system: The right common carotid artery is within normal limits. Atherosclerotic changes at the right carotid bifurcation are stable. There is no significant stenosis relative to the more distal vessel. Mild tortuosity is present without significant stenosis. Left carotid system: The left common carotid artery is within normal limits. Atherosclerotic calcifications are again noted at the carotid bifurcation. There is noncalcified plaque as well. Atherosclerotic disease is more profound than on the right. There is no significant stenosis relative to the more distal vessel. Cervical left ICA is otherwise normal. Vertebral arteries: The left vertebral artery is the dominant vessel. Both vertebral arteries originate from the subclavian arteries. There is a moderate proximal stenosis of the non dominant right vertebral artery. There is mild irregularity throughout the right vertebral. The right vertebral  artery is occluded at the C1-2 level. Contrast above this level is likely retrograde. Skeleton: Stable. Osseous foraminal narrowing is present multiple levels, greatest at C4-5. No focal lytic or blastic lesions are present. No acute or healing fractures are present. Other neck: The soft tissues of the neck are otherwise unremarkable. No significant adenopathy is present. Salivary glands are within normal limits. Thyroid is unremarkable. Upper chest: Mild dependent atelectasis is present. The thoracic inlet is within normal limits. The upper mediastinum is unremarkable. Review of the MIP images confirms the above findings CTA HEAD FINDINGS Anterior circulation: Cavernous carotid atherosclerotic calcifications are present bilaterally without a significant stenosis. The ICA terminus is normal bilaterally. The A1 and M1 segments are normal. Anterior communicating artery is patent. Moderate diffuse small vessel irregularity is present without a significant proximal occlusion or stenosis. There is no significant interval change. Posterior circulation: Previously noted high-grade stenosis of the intracranial right vertebral artery is again seen. There is near complete occlusion now at the dural margin. The left vertebral artery is unchanged without a significant stenosis. Mild atherosclerotic changes are present. The proximal basilar artery is normal. The right AICA and PICA vessels opacify. Both posterior cerebral arteries originate from the basilar tip. A right posterior communicating artery is present. Moderate narrowing of distal PCA branch vessels is stable. Venous sinuses: The dural sinuses are patent. Straight sinus and deep cerebral veins are intact. Cortical veins are unremarkable. Anatomic variants: None. Delayed phase: Not performed in the emergent setting. Review of the MIP images confirms the above findings IMPRESSION: 1. New high-grade stenosis or occlusion of the right vertebral artery at the C1-2 level. 2.  High-grade stenosis of the right vertebral artery at the dural margin. 3. Extensive distal small vessel disease is otherwise stable. 4. No new significant proximal stenosis or occlusion in the anterior circulation. 5. Atherosclerotic disease at the aortic arch and bilateral carotid bifurcations is stable. The above was relayed via text pager to Dr. Roland Rack on 10/28/2017 at 14:14 . Electronically Signed   By: San Morelle M.D.   On: 10/28/2017 14:14   Ct Head Code Stroke Wo Contrast  Result Date: 10/28/2017 CLINICAL  DATA:  Code stroke.  Right facial droop and aphasia. EXAM: CT HEAD WITHOUT CONTRAST TECHNIQUE: Contiguous axial images were obtained from the base of the skull through the vertex without intravenous contrast. COMPARISON:  01/23/2017 FINDINGS: Brain: There is no evidence of acute infarct, intracranial hemorrhage, mass, midline shift, or extra-axial fluid collection. Patchy cerebral white matter hypodensities are similar to the prior study and compatible with moderate chronic small vessel ischemic disease. There is a chronic lacunar infarct in the left internal capsule. Cerebral atrophy is unchanged and likely normal for age. Vascular: Calcified atherosclerosis at the skull base. No hyperdense vessel. Skull: No fracture or focal osseous lesion. Sinuses/Orbits: Partially polypoid mucosal thickening in the inferior right maxillary sinus. Clear mastoid air cells. Unremarkable orbits. Other: None. ASPECTS [facility] Stroke Program Early CT Score) - Ganglionic level infarction (caudate, lentiform nuclei, internal capsule, insula, M1-M3 cortex): 7 - Supraganglionic infarction (M4-M6 cortex): 3 Total score (0-10 with 10 being normal): 10 IMPRESSION: 1. No evidence of acute intracranial abnormality. 2. ASPECTS is 10. 3. Moderate chronic small vessel ischemic disease. Electronically Signed   By: Logan Bores M.D.   On: 10/28/2017 12:17    Impression/Recommendations:   1. Atrial fibrillation  with slow ventricular response  - Mr. Salek has persistent a fib with CHADS-VASc at least 51 (age x2, CVA x2, HTN), not anticoagulated d/t recurrent falls  - Admission EKG features atrial fib with rate 51  - There have been pauses of ~2.5 seconds overnight - This is asymptomatic and patient is sleeping during the pauses  - Has hx of bradycardia with rates as low as 30's going back more than a year; cardiology had preferred to monitor this back in May 2018 given that he is mainly sedentary and without associated symptoms  - Does not appear to be on any offending medications  - EKG repeated and features AF with slow ventricular response, rate 50s  - Plan to check electrolytes and TSH, continue cardiac monitoring, discuss with on-call cardiologist   2. CVA  - Treated with tPA on 10/28/17  - Head CT stable on 2/9 - Ongoing management per primary team    Thank you for this consultation.  Our [facility] hospitalist team will follow the patient with you.   Time Spent: 62 minutes.   Ilene Qua Luverna Degenhart M.D. [facility] 10/30/2017, 12:56 AM

## 2017-10-31 ENCOUNTER — Other Ambulatory Visit: Payer: Self-pay

## 2017-10-31 DIAGNOSIS — I10 Essential (primary) hypertension: Secondary | ICD-10-CM

## 2017-10-31 DIAGNOSIS — R001 Bradycardia, unspecified: Secondary | ICD-10-CM

## 2017-10-31 DIAGNOSIS — I63412 Cerebral infarction due to embolism of left middle cerebral artery: Secondary | ICD-10-CM

## 2017-10-31 DIAGNOSIS — E785 Hyperlipidemia, unspecified: Secondary | ICD-10-CM

## 2017-10-31 LAB — CBC
HCT: 39.3 % (ref 39.0–52.0)
HEMOGLOBIN: 12.7 g/dL — AB (ref 13.0–17.0)
MCH: 31.3 pg (ref 26.0–34.0)
MCHC: 32.3 g/dL (ref 30.0–36.0)
MCV: 96.8 fL (ref 78.0–100.0)
PLATELETS: 191 10*3/uL (ref 150–400)
RBC: 4.06 MIL/uL — AB (ref 4.22–5.81)
RDW: 14.9 % (ref 11.5–15.5)
WBC: 7.4 10*3/uL (ref 4.0–10.5)

## 2017-10-31 LAB — BASIC METABOLIC PANEL
Anion gap: 10 (ref 5–15)
BUN: 16 mg/dL (ref 6–20)
CHLORIDE: 111 mmol/L (ref 101–111)
CO2: 20 mmol/L — ABNORMAL LOW (ref 22–32)
Calcium: 8.7 mg/dL — ABNORMAL LOW (ref 8.9–10.3)
Creatinine, Ser: 0.9 mg/dL (ref 0.61–1.24)
Glucose, Bld: 99 mg/dL (ref 65–99)
POTASSIUM: 4 mmol/L (ref 3.5–5.1)
SODIUM: 141 mmol/L (ref 135–145)

## 2017-10-31 LAB — MAGNESIUM: Magnesium: 1.8 mg/dL (ref 1.7–2.4)

## 2017-10-31 LAB — PHOSPHORUS: PHOSPHORUS: 2.6 mg/dL (ref 2.5–4.6)

## 2017-10-31 MED ORDER — ASPIRIN EC 325 MG PO TBEC: 325.0000 mg | DELAYED_RELEASE_TABLET | Freq: Every day | ORAL | Status: DC

## 2017-10-31 MED ORDER — PRAVASTATIN SODIUM 40 MG PO TABS
40.0000 mg | ORAL_TABLET | Freq: Every day | ORAL | Status: DC
  Administered 2017-10-31: 40 mg via ORAL
  Filled 2017-10-31: qty 1

## 2017-10-31 MED ORDER — RESOURCE THICKENUP CLEAR PO POWD: 1.0000 | ORAL | 1 refills | Status: DC | PRN

## 2017-10-31 MED ORDER — LEVOTHYROXINE SODIUM 50 MCG PO TABS: 50.0000 ug | ORAL_TABLET | Freq: Every day | ORAL | 1 refills | Status: DC

## 2017-10-31 MED ORDER — PRAVASTATIN SODIUM 40 MG PO TABS: 40.0000 mg | ORAL_TABLET | Freq: Every day | ORAL | 1 refills | Status: DC

## 2017-10-31 NOTE — Clinical Social Work Note (Signed)
Clinical Social Work Assessment  Patient Details  Name: Mario May MRN: [id number] Date of Birth: [birth year]  Date of referral:  10/31/17               Reason for consult:  Facility Placement                Permission sought to share information with:  Facility Sport and exercise psychologist, Family Supports Permission granted to share information::  Yes, Verbal Permission Granted  Name::     Medical sales representative::  SNF  Relationship::  Daughter  Contact Information:     Housing/Transportation Living arrangements for the past 2 months:  Single Family Home Source of Information:  Adult Children, Patient Patient Interpreter Needed:  None Criminal Activity/Legal Involvement Pertinent to Current Situation/Hospitalization:  No - Comment as needed Significant Relationships:  Adult Children, Spouse Lives with:  Self, Spouse Do you feel safe going back to the place where you live?  Yes Need for family participation in patient care:  No (Coment)  Care giving concerns:  Patient lives at home with his wife and was independent PTA. Patient will benefit from short term rehab prior to returning home in order to improve mobility and ability to independently care for self.   Social Worker assessment / plan:  CSW met with patient and patient's daughter at bedside to discuss post-acute discharge needs, and difference between SNF and [facility] services. CSW explained expectations for SNF versus HH, and discussed referral process. CSW completed referral and faxed out.   Employment status:  Retired Nurse, adult PT Recommendations:  Inpatient [place] / Referral to community resources:  [place]  Patient/Family's Response to care:  Patient's daughter agreeable to SNF placement.  Patient/Family's Understanding of and Emotional Response to Diagnosis, Current Treatment, and Prognosis:  Patient's daughter discussed how she knew that the patient wouldn't be able  to tolerate the level of therapy required for CIR, but that he would probably benefit from a short SNF stay before returning home. Patient's daughter acknowledged that the patient's wife would not be able to provide assistance needed at this time, he would need to be stronger before returning home. Patient's daughter will talk with the patient and convince him to go, because he will want to go home instead.  Emotional Assessment Appearance:  Appears stated age Attitude/Demeanor/Rapport:  Engaged Affect (typically observed):  Pleasant Orientation:  Oriented to Self, Oriented to Place, Oriented to  Time, Oriented to Situation Alcohol / Substance use:  Not Applicable Psych involvement (Current and /or in the community):  No (Comment)  Discharge Needs  Concerns to be addressed:  Care Coordination Readmission within the last 30 days:  No Current discharge risk:  Dependent with Mobility Barriers to Discharge:  Continued Medical Work up, [place],  10/31/2017, 12:14 PM

## 2017-10-31 NOTE — Progress Notes (Signed)
Physical Therapy Treatment Patient Details Name: Mario May MRN: [id number] DOB: August 15, 1915 Today's Date: 10/31/2017    History of Present Illness 84 y.o. male who was at a GI appointment about bloating when he developed acute onset facial weakness and aphasia. He actually improved en route and subsequently re-worsened. Initially, he was not given IV tPA due to mild symptoms, but when he worsened within the window he was treated    PT Comments    Pt ambulating near baseline per daughter. Discussed that pt would need 24/7 assist for pt to return home safely. Pt with delayed processing as well. Dtr reports they may go to a SNF for 3 days. Acute PT to con't to follow while in hospitl.   Follow Up Recommendations  Supervision/Assistance - 24 hour;SNF(pt family deferred CIR, deciding SNF vs HH)     Equipment Recommendations       Recommendations for Other Services       Precautions / Restrictions Precautions Precautions: Fall Restrictions Weight Bearing Restrictions: No    Mobility  Bed Mobility Overal bed mobility: Needs Assistance Bed Mobility: Supine to Sit     Supine to sit: Mod assist;+2 for safety/equipment     General bed mobility comments: moderate assist to pull trunk to upright using HHA   Transfers Overall transfer level: Needs assistance Equipment used: Rolling walker (2 wheeled) Transfers: Sit to/from Stand Sit to Stand: Mod assist;+2 physical assistance;+2 safety/equipment         General transfer comment: modA to block feet to prevent from sliding. Pt with retropulsion as well due to fear of falling  Ambulation/Gait Ambulation/Gait assistance: Mod assist;+2 safety/equipment(for chair follow) Ambulation Distance (Feet): 130 Feet Assistive device: Rolling walker (2 wheeled) Gait Pattern/deviations: Step-through pattern;Decreased stride length;Shuffle Gait velocity: slow Gait velocity interpretation: Below normal speed for age/gender General Gait  Details: max directional v/c's to stay in walker and not push to far out in front. encouraged pt to pick up feet instead of shuffle. pt responsive to recommendations   Stairs            Wheelchair Mobility    Modified Rankin (Stroke Patients Only) Modified Rankin (Stroke Patients Only) Pre-Morbid Rankin Score: Moderate disability Modified Rankin: Severe disability     Balance Overall balance assessment: Needs assistance Sitting-balance support: No upper extremity supported Sitting balance-Leahy Scale: Fair       Standing balance-Leahy Scale: Poor Standing balance comment: UE and external support for balance                            Cognition Arousal/Alertness: Awake/alert Behavior During Therapy: WFL for tasks assessed/performed Overall Cognitive Status: Impaired/Different from baseline Area of Impairment: Attention;Following commands;Awareness;Problem solving                   Current Attention Level: Sustained   Following Commands: Follows one step commands consistently;Follows one step commands with increased time   Awareness: Intellectual Problem Solving: Slow processing;Requires verbal cues;Requires tactile cues General Comments: pt extremely hard of hearing but was able to follow commands, pt appears to have decreased attn span but may be due to [facility]      Exercises      General Comments General comments (skin integrity, edema, etc.): Pt's daughter present end of session       Pertinent Vitals/Pain Pain Assessment: No/denies pain    Home Living  Prior Function            PT Goals (current goals can now be found in the care plan section) Acute Rehab PT Goals Patient Stated Goal: none stated; per daughter to return to PLOF  Progress towards PT goals: Progressing toward goals    Frequency    Min 4X/week      PT Plan Current plan remains appropriate    Co-evaluation               AM-PAC PT "6 Clicks" Daily Activity  Outcome Measure  Difficulty turning over in bed (including adjusting bedclothes, sheets and blankets)?: Unable Difficulty moving from lying on back to sitting on the side of the bed? : Unable Difficulty sitting down on and standing up from a chair with arms (e.g., wheelchair, bedside commode, etc,.)?: Unable Help needed moving to and from a bed to chair (including a wheelchair)?: A Lot Help needed walking in hospital room?: A Lot Help needed climbing 3-5 steps with a railing? : Total 6 Click Score: 8    End of Session Equipment Utilized During Treatment: Gait belt Activity Tolerance: No increased pain Patient left: in chair;with call bell/phone within reach Nurse Communication: Mobility status PT Visit Diagnosis: Other symptoms and signs involving the nervous system (E83.151)     Time: 7616-0737 PT Time Calculation (min) (ACUTE ONLY): 20 min  Charges:  $Gait Training: 8-22 mins                    G Codes:       Kittie Plater, PT, DPT Pager #: [phone] Office #: [phone]    [name] 10/31/2017, 2:10 PM

## 2017-10-31 NOTE — Progress Notes (Signed)
[facility]  Consult Note   Brief: Mario May is 84 year old male with medical history of persistent A. fib not on anticoagulation due to recurrent fall, hypertension, hypothyroidism and chronic diastolic CHF who was admitted to the hospital 2/8 with acute CVA and treated with tPA.  Internal medicine team was consulted for persistent bradycardia and recurrent pauses cardiac monitor.    S: Patient seen and examined, he continues to be asymptomatic, no dizziness, chest pain or syncopal episodes.  O: Vitals:   10/31/17 0537 10/31/17 0900  BP: (!) 147/73 (!) 151/62  Pulse: (!) 58 (!) 54  Resp: 18 18  Temp: 97.8 F (36.6 C) (!) 97.5 F (36.4 C)  SpO2: 100% 100%   General: Pt is alert, awake, not in acute distress Cardiovascular: Bradycardia, S1/S2 +, no rubs, no gallops Respiratory: CTA bilaterally, no wheezing, no rhonchi Extremities: no edema Neuro: Dysarthria, normal strength   Analysis and plan: A. fib with slow ventricular response -rhythm on and off from A. Fib Bradycardia ranging from 45-50's Telemetry monitoring heart rate goes as low as 38 while sleeping.  Having some pauses less than a second. Bradycardia was worked up by cardiology in 2018 who recommended monitor, they recommend if patient becomes symptomatic pacemaker would be a treatment of choice.  Family will prefer conservative treatment for now, as patient is asymptomatic Electrolytes and TSH normal No further workup at this point  CVA Treated with TPA 10/28/17 CT head on 2/9 stable Tina management per primary team  Medical team will sign off, if further assistance please call us back.   Chipper Oman, MD

## 2017-10-31 NOTE — Progress Notes (Signed)
Pt 84 yrs old male  ,  HOH, some short term memory impairment.   Pt occasionally brady down to 30's but unsustained was called by CCMT that pt, had 9 sec pause.  On call MD paged  No orders given patient  . Pt asytomatic. ,denied dizziness or chest pain. Rn will continue to monitor.

## 2017-10-31 NOTE — Progress Notes (Signed)
Report called to [facility], nurse at [facility].

## 2017-10-31 NOTE — Clinical Social Work Placement (Signed)
Nurse to call report to [phone]    CLINICAL SOCIAL WORK PLACEMENT  NOTE  Date:  10/31/2017  Patient Details  Name: Mario May MRN: [id number] Date of Birth: 1915-03-01  Clinical Social Work is seeking post-discharge placement for this patient at the [place] level of care (*CSW will initial, date and re-position this form in  chart as items are completed):      Patient/family provided with [place] Work Department's list of facilities offering this level of care within the geographic area requested by the patient (or if unable, by the patient's family).  Yes   Patient/family informed of their freedom to choose among providers that offer the needed level of care, that participate in Medicare, Medicaid or managed care program needed by the patient, have an available bed and are willing to accept the patient.  Yes   Patient/family informed of [facility] ownership interest in [facility] and [facility], as well as of the fact that they are under no obligation to receive care at these facilities.  PASRR submitted to EDS on 10/31/17     PASRR number received on 10/31/17     Existing PASRR number confirmed on       FL2 transmitted to all facilities in geographic area requested by pt/family on 10/31/17     FL2 transmitted to all facilities within larger geographic area on       Patient informed that his/her managed care company has contracts with or will negotiate with certain facilities, including the following:        Yes   Patient/family informed of bed offers received.  Patient [facility] bed at [facility]     Physician recommends and patient chooses bed at      Patient to be transferred to [facility] on 10/31/17.  Patient to be transferred to facility by PTAR     Patient family notified on 10/31/17 of transfer.  Name of family member notified:  Cindy     PHYSICIAN       Additional Comment:     _______________________________________________ Geralynn Ochs, LCSW 10/31/2017, 3:21 PM

## 2017-10-31 NOTE — NC FL2 (Signed)
[place] LEVEL OF CARE SCREENING TOOL     IDENTIFICATION  Patient Name: Mario May Birthdate: 1915/05/24 Sex: male Admission Date (Current Location): 10/28/2017  [facility] and [place] Number:  [employer] and Address:  [facility], [place] [address], [place], [place] [address]      Provider Number: [id number]  Attending Physician Name and Address:  Garvin Fila, MD  Relative Name and Phone Number:       Current Level of Care: Hospital Recommended Level of Care: [place] Prior Approval Number:    Date Approved/Denied:   PASRR Number: [id number]  Discharge Plan: SNF    Current Diagnoses: Patient Active Problem List   Diagnosis Date Noted  . Stroke (cerebrum) ([place]) 10/28/2017  . CAP (community acquired pneumonia) 08/21/2017  . Neck pain 08/21/2017  . Bradycardia 01/23/2017  . Atrial fibrillation with slow ventricular response ([place]) 01/23/2017  . Dehydration 08/19/2016  . Elevated troponin 08/18/2016  . AKI (acute kidney injury) ([place]) 08/18/2016  . Difficulty in walking, not elsewhere classified   . Gait instability   . Left leg cellulitis 06/05/2016  . Orthostatic hypotension 06/04/2016  . 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 Lumbar foraminal stenosis 06/04/2016  . DJD (degenerative joint disease), multiple sites 06/04/2016  . Fever 06/04/2016  . Leukocytosis 06/04/2016  . Stasis dermatitis of both legs 07/01/2013  . Essential hypertension 03/15/2013  . Hypothyroidism, adult 03/18/2009  . CONSTIPATION 03/18/2009  . CHANGE IN BOWELS 03/18/2009    Orientation RESPIRATION BLADDER Height & Weight     Self, Time, Situation, Place  Normal Continent Weight: 156 lb 12 oz (71.1 kg) Height:  5\' 11"  (180.3 cm)  BEHAVIORAL SYMPTOMS/MOOD NEUROLOGICAL BOWEL NUTRITION STATUS      Continent Diet(mechanical soft)  AMBULATORY STATUS COMMUNICATION OF NEEDS Skin   Extensive Assist Verbally  Normal                       Personal Care Assistance Level of Assistance  Bathing, Feeding, Dressing Bathing Assistance: Limited assistance Feeding assistance: Limited assistance Dressing Assistance: Limited assistance     Functional Limitations Info  Sight, Hearing, Speech Sight Info: Adequate Hearing Info: Impaired(hard of hearing) Speech Info: Impaired(slurred speech)    SPECIAL CARE FACTORS FREQUENCY  PT (By licensed PT), OT (By licensed OT), Speech therapy     PT Frequency: 5x/wk OT Frequency: 5x/wk     Speech Therapy Frequency: 5x/wk      Contractures Contractures Info: Not present    Additional Factors Info  Code Status, Allergies Code Status Info: Full Allergies Info: Codeine, Grapefruit Concentrate, Other, Pineapple           Current Medications (10/31/2017):  This is the current hospital active medication list Current Facility-Administered Medications  Medication Dose Route Frequency Provider Last Rate Last Dose  .  stroke: mapping our early stages of recovery book   Does not apply Once Greta Doom, MD      . acetaminophen (TYLENOL) tablet 650 mg  650 mg Oral Q4H PRN Greta Doom, MD   650 mg at 10/31/17 1146   Or  . acetaminophen (TYLENOL) solution 650 mg  650 mg Per Tube Q4H PRN Greta Doom, MD       Or  . acetaminophen (TYLENOL) suppository 650 mg  650 mg Rectal Q4H PRN Greta Doom, MD      . allopurinol (ZYLOPRIM) tablet 100 mg  100 mg  Oral Daily Greta Doom, MD   100 mg at 10/31/17 0818  . [START ON 11/01/2017] aspirin EC tablet 325 mg  325 mg Oral Daily Costello, Mary A, NP      . finasteride (PROSCAR) tablet 5 mg  5 mg Oral Daily Greta Doom, MD   5 mg at 10/31/17 0818  . isosorbide dinitrate (ISORDIL) tablet 10 mg  10 mg Oral BID Greta Doom, MD   10 mg at 10/31/17 0818  . levothyroxine (SYNTHROID, LEVOTHROID) tablet 50 mcg  50 mcg Oral QAC breakfast Greta Doom, MD   50 mcg at 10/31/17 0818  . pantoprazole (PROTONIX) EC tablet 40 mg  40 mg Oral Q0600 Greta Doom, MD   40 mg at 10/31/17 0819  . pravastatin (PRAVACHOL) tablet 40 mg  40 mg Oral q1800 Costello, Kayren Eaves, NP      . RESOURCE THICKENUP CLEAR   Oral PRN Greta Doom, MD         Discharge Medications: Please see discharge summary for a list of discharge medications.  Relevant Imaging Results:  Relevant Lab Results:   Additional Information SS#: [id number]  Geralynn Ochs, LCSW

## 2017-10-31 NOTE — Progress Notes (Signed)
Report attempted again for [facility], no answer, will try again as able.

## 2017-10-31 NOTE — Progress Notes (Signed)
Occupational Therapy Treatment Patient Details Name: Mario May MRN: [id number] DOB: [birth year] Today's Date: 10/31/2017    History of present illness 84 y.o. male who was at a GI appointment about bloating when he developed acute onset facial weakness and aphasia. He actually improved en route and subsequently re-worsened. Initially, he was not given IV tPA due to mild symptoms, but when he worsened within the window he was treated   OT comments  Pt progressing towards goals, pleasant and willing to work this therapy this session. Pt completing functional mobility within room using RW with overall ModA+2. Pt completing seated grooming ADLs with setup/supervision assist with increased time for task completion. Pt continues to demonstrate decreased dynamic balance and cognitive impairments. Continue to recommend post acute therapies to maximize Pt's overall safety and independence with ADLs and mobility. Will continue to follow acutely to progress Pt towards established OT goals.    Follow Up Recommendations  CIR;Supervision/Assistance - 24 hour    Equipment Recommendations  Other (comment)(defer to next venue )          Precautions / Restrictions Precautions Precautions: Fall Restrictions Weight Bearing Restrictions: No       Mobility Bed Mobility Overal bed mobility: Needs Assistance Bed Mobility: Supine to Sit     Supine to sit: Mod assist;+2 for safety/equipment     General bed mobility comments: moderate assist to pull trunk to upright using HHA   Transfers Overall transfer level: Needs assistance Equipment used: Rolling walker (2 wheeled) Transfers: Sit to/from Stand Sit to Stand: Mod assist;+2 physical assistance;+2 safety/equipment         General transfer comment: ModA+2 to rise and steady at RW, verbal cues for maintaining upright posture upon standing; Pt stood from EOB and from chair with arm rests during session     Balance Overall balance  assessment: Needs assistance   Sitting balance-Leahy Scale: Fair       Standing balance-Leahy Scale: Poor Standing balance comment: UE and external support for balance                           ADL either performed or assessed with clinical judgement   ADL Overall ADL's : Needs assistance/impaired     Grooming: Set up;Sitting;Wash/dry face;Oral care;Min guard Grooming Details (indicate cue type and reason): increased time to complete tasks; supported sitting at sink                              Functional mobility during ADLs: Moderate assistance;+2 for physical assistance;+2 for safety/equipment;Rolling walker General ADL Comments: Pt completing room level functional mobility EOB>chair at sink for seated grooming ADLs; Pt then completing further mobility from sink to recliner, requires ModA+2 for safe transfers/mobility using RW; requires multimodal cues for keeping RW closer to body during mobility                        Cognition Arousal/Alertness: Awake/alert Behavior During Therapy: [facility] for tasks assessed/performed;Anxious Overall Cognitive Status: Impaired/Different from baseline Area of Impairment: Attention;Following commands;Awareness;Problem solving                   Current Attention Level: Sustained   Following Commands: Follows one step commands consistently;Follows one step commands with increased time   Awareness: Intellectual Problem Solving: Slow processing;Requires verbal cues;Requires tactile cues General Comments: requires increased time and multimodal cues to complete  tasks; thought partly also due to Pt HOH                     General Comments Pt's daughter present end of session     Pertinent Vitals/ Pain       Pain Assessment: No/denies pain                                                          Frequency  Min 2X/week        Progress Toward Goals  OT Goals(current  goals can now be found in the care plan section)  Progress towards OT goals: Progressing toward goals  Acute Rehab OT Goals Patient Stated Goal: none stated; per daughter to return to PLOF  OT Goal Formulation: With patient/family Time For Goal Achievement: 11/13/17 Potential to Achieve Goals: Good  Plan Discharge plan remains appropriate                     AM-PAC PT "6 Clicks" Daily Activity     Outcome Measure   Help from another person eating meals?: None Help from another person taking care of personal grooming?: A Little Help from another person toileting, which includes using toliet, bedpan, or urinal?: A Lot Help from another person bathing (including washing, rinsing, drying)?: A Lot Help from another person to put on and taking off regular upper body clothing?: A Little Help from another person to put on and taking off regular lower body clothing?: A Lot 6 Click Score: 16    End of Session Equipment Utilized During Treatment: Gait belt;Rolling walker  OT Visit Diagnosis: Unsteadiness on feet (R26.81);Muscle weakness (generalized) (M62.81)   Activity Tolerance Patient tolerated treatment well   Patient Left in chair;with call bell/phone within reach;with chair alarm set;with family/visitor present   Nurse Communication Mobility status        Time: 9485-4627 OT Time Calculation (min): 28 min  Charges: OT General Charges $OT Visit: 1 Visit OT Treatments $Self Care/Home Management : 23-37 mins  Lou Cal, OT Pager [phone] 10/31/2017    Raymondo Band 10/31/2017, 11:38 AM

## 2017-10-31 NOTE — Progress Notes (Signed)
Patient picked up by PTAR I called and notified daughter she will meet him at [facility]

## 2017-10-31 NOTE — Care Management Note (Signed)
Case Management Note  Patient Details  Name: Mario May MRN: [id number] Date of Birth: [birth year]  Subjective/Objective:    Pt admitted with stroke symptoms and is s/p TPA. He is from home with his spouse.              Action/Plan: Pt discharging to [facility] for rehab. No further needs per CM.  Expected Discharge Date:  10/31/17               Expected Discharge Plan:  Skilled Nursing Facility  In-House Referral:  Clinical Social Work  Discharge planning Services     Post Acute Care Choice:    Choice offered to:     DME Arranged:    DME Agency:     HH Arranged:    [place] Agency:     Status of Service:  Completed, signed off  If discussed at [employer], dates discussed:    Additional Comments:  Pollie Friar, RN 10/31/2017, 2:59 PM

## 2017-10-31 NOTE — Progress Notes (Signed)
Mario May is a 84 y.o. male with history of A fib--no anticoagulation due to falls, HOH, mild cognitive impairment, chronic GI issues, gait disorder who was admitted from MD office on 10/28/17 with right facial droop and difficulty speaking. CT head negative and he improved en route to the hospital.  He did worsen in [place] right sided weakness and received tPA.  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 2D echo showed EF 55-60% with mild thickened MV and no wall abnormality.   Cardiology consulted due to marked bradycardia with 2/5 second pauses at night. As patient with history of asymptomatic bradycardia no treatment indicated--PPM if symptomatic while awake.  ASA recommended by Dr. Leonie Man for embolic stroke due to A fib.  Aphasia has resolved and diet advanced to dysphagia, thin liquids. Therapy evaluations revealed functional deficits and CIR wasrecommended by rehab team.   Patient examined and discussed CIR level therapy with patient and daughter. He was independent with ADLs and for mobility--"but it took time" and they both feel that three hours of therapy is too much. His home is small and he has to walk about 30 -50 feet to get around the house. Daughter lives in the neighborhood and checks in daily. They would like to discuss HH v/s SNF level therapy with Case manager or LCSW piror to discharge. Will defer CIR consult

## 2017-10-31 NOTE — Progress Notes (Signed)
I have notified RN CM and SW of daughter's request for SNF or HH. We will sign off . [phone]

## 2017-10-31 NOTE — Discharge Summary (Signed)
Stroke Discharge Summary  Patient ID: Mario May   MRN: [id number]      DOB: October 04, 1914  Date of Admission: 10/28/2017 Date of Discharge: 10/31/2017  Attending Physician:  Garvin Fila, MD, Stroke MD Consultant(s):   Treatment Team:  Stroke, Md, MD Loura Back, MD rehabilitation medicine and Hospitalists  Patient's PCP:  Dione Housekeeper, MD  DISCHARGE DIAGNOSIS:  Principal Problem:   Stroke (cerebrum) [facility]) Active Problems:   Atrial fibrillation with slow ventricular response ([place]) Hypertension Hyperlipidemia Bradycardia Hypokalemia CKD  Past Medical History:  Diagnosis Date  . Anemia   . Arthritis    "just my legs" (04/06/2013)  . Assistance needed for mobility    walks with walker  . Atrial fibrillation ([place])   . Constipation   . Gout   . HOH (hard of hearing)   . Hyperlipidemia   . Hypertension   . Hypothyroidism   . Kidney stones    "only once" (04/06/2013)  . Lumbar foraminal stenosis 06/04/2016  . 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 Rheumatism   . SIRS (systemic inflammatory response syndrome) ([place]) 02/2013   [name] 03/19/2013  (04/06/2013)  . Small bowel obstruction [facility])    Past Surgical History:  Procedure Laterality Date  . CYSTOSCOPY W/ STONE MANIPULATION  ?1986  . NASAL FRACTURE SURGERY    . TRANSURETHRAL RESECTION OF PROSTATE      Allergies as of 10/31/2017      Reactions   Codeine Other (See Comments)   Passes out   Grapefruit Concentrate Other (See Comments)   unknown   Other Other (See Comments)   Potatoes, green beans, corn - unknown   Pineapple Other (See Comments)   unknown      Medication List    TAKE these medications   allopurinol 100 MG tablet Commonly known as:  ZYLOPRIM Take 100 mg by mouth daily.   aspirin 325 MG tablet Take 325 mg by mouth daily.   finasteride 5 MG tablet Commonly known as:  PROSCAR Take 5 mg by mouth daily.   furosemide 20 MG tablet Commonly known as:  LASIX Take 10  mg by mouth daily.   GAS-X PO Take 1 tablet by mouth daily as needed (flatulence).   isosorbide dinitrate 10 MG tablet Commonly known as:  ISORDIL Take 10 mg by mouth 2 (two) times daily.   levothyroxine 50 MCG tablet Commonly known as:  SYNTHROID, LEVOTHROID Take 1 tablet (50 mcg total) by mouth daily before breakfast. Start taking on:  11/01/2017 What changed:    medication strength  how much to take  when to take this   meclizine 25 MG tablet Commonly known as:  ANTIVERT Take 25 mg by mouth 3 (three) times daily as needed (inner ear problems).   ondansetron 4 MG tablet Commonly known as:  ZOFRAN Take 1 tablet (4 mg total) by mouth every 6 (six) hours as needed.   pantoprazole 40 MG tablet Commonly known as:  PROTONIX Take 1 tablet (40 mg total) by mouth daily. What changed:  when to take this   polyethylene glycol packet Commonly known as:  MIRALAX / GLYCOLAX Take 17 g by mouth daily as needed for mild constipation.   pravastatin 40 MG tablet Commonly known as:  PRAVACHOL Take 1 tablet (40 mg total) by mouth daily at 6 PM.   RESOURCE THICKENUP CLEAR Powd Take 120 g by mouth as needed (as needed to thicken diet).      LABORATORY STUDIES  CBC    Component Value Date/Time   WBC 7.4 10/31/2017 0227   RBC 4.06 (L) 10/31/2017 0227   HGB 12.7 (L) 10/31/2017 0227   HCT 39.3 10/31/2017 0227   PLT 191 10/31/2017 0227   MCV 96.8 10/31/2017 0227   MCH 31.3 10/31/2017 0227   MCHC 32.3 10/31/2017 0227   RDW 14.9 10/31/2017 0227   LYMPHSABS 1.4 10/30/2017 0045   MONOABS 0.3 10/30/2017 0045   EOSABS 0.1 10/30/2017 0045   BASOSABS 0.0 10/30/2017 0045   CMP    Component Value Date/Time   NA 141 10/31/2017 0227   K 4.0 10/31/2017 0227   CL 111 10/31/2017 0227   CO2 20 (L) 10/31/2017 0227   GLUCOSE 99 10/31/2017 0227   BUN 16 10/31/2017 0227   CREATININE 0.90 10/31/2017 0227   CALCIUM 8.7 (L) 10/31/2017 0227   PROT 6.1 (L) 10/30/2017 0045   ALBUMIN 3.0 (L)  10/30/2017 0045   AST 27 10/30/2017 0045   ALT 9 (L) 10/30/2017 0045   ALKPHOS 116 10/30/2017 0045   BILITOT 1.2 10/30/2017 0045   GFRNONAA >60 10/31/2017 0227   GFRAA >60 10/31/2017 0227   COAGS Lab Results  Component Value Date   INR 1.07 10/28/2017   Lipid Panel    Component Value Date/Time   CHOL 191 10-Nov-2017 0517   TRIG 92 11-10-17 0517   HDL 43 10-Nov-2017 0517   CHOLHDL 4.4 November 10, 2017 0517   VLDL 18 10-Nov-2017 0517   LDLCALC 130 (H) 2017-11-10 0517   HgbA1C  Lab Results  Component Value Date   HGBA1C 5.9 (H) 2017/11/10   Urinalysis    Component Value Date/Time   COLORURINE YELLOW 10/06/2017 0105   APPEARANCEUR CLEAR 10/06/2017 0105   LABSPEC 1.018 10/06/2017 0105   PHURINE 5.0 10/06/2017 0105   GLUCOSEU NEGATIVE 10/06/2017 0105   HGBUR NEGATIVE 10/06/2017 0105   BILIRUBINUR NEGATIVE 10/06/2017 0105   KETONESUR NEGATIVE 10/06/2017 0105   PROTEINUR NEGATIVE 10/06/2017 0105   UROBILINOGEN 0.2 10/02/2013 2114   NITRITE NEGATIVE 10/06/2017 0105   LEUKOCYTESUR SMALL (A) 10/06/2017 0105   Urine Drug Screen No results found for: LABOPIA, COCAINSCRNUR, LABBENZ, AMPHETMU, THCU, LABBARB  Alcohol Level No results found for: [facility]  SIGNIFICANT DIAGNOSTIC STUDIES CT head without contrast 2017/11/10  Ct Angio Head W Or Wo Contrast Ct Angio Neck W Or Wo Contrast 10/28/2017 IMPRESSION:  1. New high-grade stenosis or occlusion of the right vertebral artery at the C1-2 level.  2. High-grade stenosis of the right vertebral artery at the dural margin.  3. Extensive distal small vessel disease is otherwise stable.  4. No new significant proximal stenosis or occlusion in the anterior circulation.  5. Atherosclerotic disease at the aortic arch and bilateral carotid bifurcations is stable.   Ct Head Wo Contrast 10/28/2017 IMPRESSION:  Severely motion degraded.  No gross hemorrhage or change from prior.   Ct Head Wo Contrast November 10, 2017 IMPRESSION: Stable compared to  admission head CT.No hemorrhage or visible infarct.  Ct Head Code Stroke Wo Contrast 10/28/2017 IMPRESSION:  1. No evidence of acute intracranial abnormality.  2. ASPECTS is 10.  3. Moderate chronic small vessel ischemic disease.   Transthoracic Echocardiogram  10/30/2017 Study Conclusions  - Left ventricle: The cavity size was normal. Wall thickness was normal. Systolic function was normal. The estimated ejection fraction was in the range of 55% to 60%. The study is not technically sufficient to allow evaluation of LV diastolic function. - Mitral valve: Nodular calcification of mitral leaflet tips  Calcified annulus. Mildly thickened leaflets . - Left atrium: The atrium was mildly dilated. - Right atrium: The atrium was mildly dilated.    HISTORY OF PRESENT ILLNESS   &   HOSPITAL COURSE HISTORY OF PRESENT ILLNESS (per record) Louellen Molder Mitchellis a 84 y.o.malewho was at a GI appointment about bloating when he developed acute onset facial weakness and aphasia. He actually improved en route and subsequently re-worsened. Initially, he was not given IV tPA due to mild symptoms, but when he worsened within the window he was treated. CTA with no LVO. At baseline, he has some mild memory problems, but he and his wife still live alone together and take care of their own ADLs.  LKW:11:30 tpa given?:yes Modified Rankin Scale: 1-No significant post stroke disability and can perform usual duties with stroke symptoms  ASSESSMENT/PLAN Mr. ADRIANA LINA is a 84 y.o. male with history of hearing impairment, permanent atrial fibrillation, bradycardia, hypertension, anemia, memory problems and hypothyroidism  presenting with facial weakness and aphasia. The patient received IV TPA on Friday, 10/28/2017 at 1230.  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  Resultant  no focal deficits   CT head - Severely motion degraded.  CT Head - F/U TPA -  Stable compared to admission head CT.No hemorrhage or visible infarct. Start ASA.  MRA head - not performed  CTA H&N - New high-grade stenosis or occlusion of the right vertebral artery at the C1-2 level. High-grade stenosis of the right vertebral artery at the dural margin.   Carotid Doppler - CTA neck  2D Echo - EF 55-60%. No cardiac source of emboli identified.  LDL - 130  HgbA1c - 5.9  VTE prophylaxis - SCDs  Check puncture sites for bleeding or hematomas.  Bleeding precautions  Fall precautions  DIET DYS 2 Room service appropriate? Yes; Fluid consistency: Thin  aspirin 325 mg daily prior to admission, now on ASA 325mg  daily S/P TPA  Ongoing aggressive stroke risk factor management  Therapy recommendations:  SNF at family request  Disposition:  SNF, patient and daughter do not want CIR at this time  Hypertension  Stable              Permissive hypertension (OK if < 220/120) but gradually normalize in 5-7 days              Long-term BP goal normotensive  Hyperlipidemia  Home meds: No lipid lowering medications prior to admission  LDL 130, goal < 70  Add Pravachol 20 mg daily  Continue statin at discharge  Other Stroke Risk Factors  Advanced age  Former cigarette smoker - quit almost 60 years ago.  Atrial fibrillation - not anticoagulated, will defer to Cardiology to decide plan for [facility] therapy  Other Active Problems  Multiple food allergies  Bradycardia - (Will check TSH -> 2.35 WNL).  Discussed bradycardia with cardiology. No plans to treat bradycardia that occurs while sleeping. Would consider pacemaker only for symptomatic bradycardia while awake.   Hearing-impaired  Elevated BUN and creatinine -> improving - possibly dehydrated.  Hypokalemia - 2.9 -> supplemented -> repeat labs K+ 4.0- Resolved  Plan / Recommendations   Stroke workup - completed at this time  Awaiting bed availability and family decision on SNF  DISCHARGE  EXAM Blood pressure (!) 148/74, pulse (!) 49, temperature (!) 97.5 F (36.4 C), temperature source Oral, resp. rate 18, height 5\' 11"  (1.803 m), weight 71.1 kg (156 lb 12 oz), SpO2 100 %. Frail malnourished  looking elderly caucasian male  . Afebrile. Head is nontraumatic. Neck is supple without bruit.    Cardiac exam no murmur or gallop. Lungs are clear to auscultation. Distal pulses are well felt. Neurological Exam ;  Awake  Alert oriented x 3. Patient is extremely hard of hearing Normal speech and language.eye movements full without nystagmus.fundi were not visualized. Vision acuity and fields appear normal. Hearing is poor bilaterally Palatal movements are normal. Face symmetric. Tongue midline. Normal strength, tone, reflexes and coordination. Normal sensation. Gait deferred.  Discharge Diet   Check puncture sites for bleeding or hematomas. Bleeding precautions Fall precautions DIET DYS 2 Room service appropriate? Yes; Fluid consistency: Thin liquids  DISCHARGE PLAN  Disposition:  [facility] SNF  aspirin 325 mg daily for secondary stroke prevention.  Ongoing risk factor control by Primary Care Physician at time of discharge  Follow-up Dione Housekeeper, MD in 2 weeks.  Follow-up with Stroke Clinic in 6 weeks, office to schedule an appointment.  Follow up with Cardiology - Dr Lovena Le in 1 week  Greater than 30 minutes were spent preparing discharge.  Mary Sella, ANP-C Neurology Stroke Team 10/31/2017, 2:51 PM   Attending note: I reviewed above note and agree with the assessment and plan. I have made any additions or clarifications directly to the above note. Pt was seen and examined.   84 year old male with history of A. fib on aspirin, HTN, HLD admitted for aphasia and facial weakness.  Status post TPA.  Symptoms improved.  Had bradycardia episode during sleep, asymptomatic, cardiology  consulted and recommend continue  observation.  CT negative, CT head and neck showed  right VA occlusion, likely chronic.  Not able to perform MRI due to claustrophobia, but repeat CT negative.  EF 55-60%.  LDL 130 and A1c 5.9.  Resumed aspirin and added pravastatin for stroke prevention.  PT/OT recommend SNF.  Discharge to SNF in good condition.  Patient is to follow-up with Dr. Lovena Le, his cardiologist, within 1-2 weeks to discuss further antiplatelet versus anticoagulation.  Neurology will sign off. Please call with questions. Pt will follow up with Cecille Rubin, NP, at [facility] in about 6 weeks. Thanks for the consult.  Rosalin Hawking, MD PhD Stroke Neurology 10/31/2017 8:50 PM

## 2017-10-31 NOTE — Progress Notes (Signed)
STROKE TEAM PROGRESS NOTE   HISTORY OF PRESENT ILLNESS (per record) Mario May is a 84 y.o. male who was at a GI appointment about bloating when he developed acute onset facial weakness and aphasia. He actually improved en route and subsequently re-worsened. Initially, he was not given IV tPA due to mild symptoms, but when he worsened within the window he was treated. CTA with no LVO. At baseline, he has some mild memory problems, but he and his wife still live alone together and take care of their own ADLs.   LKW: 11:30 tpa given?: yes Modified Rankin Scale: 1-No significant post stroke disability and can perform usual duties with stroke symptoms  SUBJECTIVE (INTERVAL HISTORY) No family members at bedside. The patient voices no complaints. No acute/new events reported overnight. Per CIR, patient and daughter prefer SNF at this time. Social work assisting family on locating local SNF with bed availability. +BM overnight per nursing report. Continues to have some intermittent bradycardia per telemetry.  OBJECTIVE Temp:  [97.4 F (36.3 C)-98 F (36.7 C)] 97.5 F (36.4 C) (02/11 0900) Pulse Rate:  [50-61] 54 (02/11 0900) Cardiac Rhythm: Atrial fibrillation;Other (Comment) (02/11 0830) Resp:  [18] 18 (02/11 0900) BP: (147-168)/(60-83) 151/62 (02/11 0900) SpO2:  [99 %-100 %] 100 % (02/11 0900)  CBC:  Recent Labs  Lab 10/28/17 1300 10/30/17 0045 10/31/17 0227  WBC 6.9 5.7 7.4  NEUTROABS 4.1 3.9  --   HGB 12.4* 12.3* 12.7*  HCT 38.0* 36.2* 39.3  MCV 96.9 96.5 96.8  PLT 183 171 329    Basic Metabolic Panel:  Recent Labs  Lab 10/30/17 1224 10/31/17 0227  NA 141 141  K 4.2 4.0  CL 108 111  CO2 23 20*  GLUCOSE 95 99  BUN 17 16  CREATININE 0.91 0.90  CALCIUM 8.9 8.7*  MG 2.1 1.8  PHOS  --  2.6    Lipid Panel:     Component Value Date/Time   CHOL 191 10/29/2017 0517   TRIG 92 10/29/2017 0517   HDL 43 10/29/2017 0517   CHOLHDL 4.4 10/29/2017 0517   VLDL 18  10/29/2017 0517   LDLCALC 130 (H) 10/29/2017 0517   HgbA1c:  Lab Results  Component Value Date   HGBA1C 5.9 (H) 10/29/2017   Urine Drug Screen: No results found for: LABOPIA, COCAINSCRNUR, LABBENZ, AMPHETMU, THCU, LABBARB  Alcohol Level No results found for: [facility]  IMAGING  CT head without contrast 10/29/2017  Ct Angio Head W Or Wo Contrast Ct Angio Neck W Or Wo Contrast 10/28/2017 IMPRESSION:  1. New high-grade stenosis or occlusion of the right vertebral artery at the C1-2 level.  2. High-grade stenosis of the right vertebral artery at the dural margin.  3. Extensive distal small vessel disease is otherwise stable.  4. No new significant proximal stenosis or occlusion in the anterior circulation.  5. Atherosclerotic disease at the aortic arch and bilateral carotid bifurcations is stable.   Ct Head Wo Contrast 10/28/2017 IMPRESSION:  Severely motion degraded.  No gross hemorrhage or change from prior.   Ct Head Wo Contrast 10/29/2017 IMPRESSION: Stable compared to admission head CT.No hemorrhage or visible infarct.  Ct Head Code Stroke Wo Contrast 10/28/2017 IMPRESSION:  1. No evidence of acute intracranial abnormality.  2. ASPECTS is 10.  3. Moderate chronic small vessel ischemic disease.   Transthoracic Echocardiogram  10/30/2017 Study Conclusions  - Left ventricle: The cavity size was normal. Wall thickness was   normal. Systolic function was normal. The estimated  ejection   fraction was in the range of 55% to 60%. The study is not   technically sufficient to allow evaluation of LV diastolic   function. - Mitral valve: Nodular calcification of mitral leaflet tips   Calcified annulus. Mildly thickened leaflets . - Left atrium: The atrium was mildly dilated. - Right atrium: The atrium was mildly dilated.   PHYSICAL EXAM Vitals:   10/30/17 2143 10/31/17 0129 10/31/17 0537 10/31/17 0900  BP: (!) 168/69 (!) 155/66 (!) 147/73 (!) 151/62  Pulse: (!) 51 61 (!) 58 (!)  54  Resp: 18 18 18 18   Temp: 97.9 F (36.6 C) 98 F (36.7 C) 97.8 F (36.6 C) (!) 97.5 F (36.4 C)  TempSrc: Oral Oral Oral Oral  SpO2: 100% 100% 100% 100%  Weight:      Height:       Frail malnourished looking elderly caucasian male  . Afebrile. Head is nontraumatic. Neck is supple without bruit.    Cardiac exam no murmur or gallop. Lungs are clear to auscultation. Distal pulses are well felt. Neurological Exam ;  Awake  Alert oriented x 3. Patient is extremely hard of hearing Normal speech and language.eye movements full without nystagmus.fundi were not visualized. Vision acuity and fields appear normal. Hearing is poor bilaterally Palatal movements are normal. Face symmetric. Tongue midline. Normal strength, tone, reflexes and coordination. Normal sensation. Gait deferred.  ASSESSMENT/PLAN [name] is a 84 y.o. male with history of hearing impairment, permanent atrial fibrillation, bradycardia, hypertension, anemia, memory problems and hypothyroidism  presenting with facial weakness and aphasia. The patient received IV TPA on Friday, 10/28/2017 at 1230.  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  Resultant  no focal deficits   CT head - Severely motion degraded.  CT Head - F/U TPA - Stable compared to admission head CT.No hemorrhage or visible infarct. Start ASA.  MRA head - not performed  CTA H&N - New high-grade stenosis or occlusion of the right vertebral artery at the C1-2 level. High-grade stenosis of the right vertebral artery at the dural margin.   Carotid Doppler - CTA neck  2D Echo - EF 55-60%. No cardiac source of emboli identified.  LDL - 130  HgbA1c - 5.9  VTE prophylaxis - SCDs Check puncture sites for bleeding or hematomas. Bleeding precautions Fall precautions DIET DYS 2 Room service appropriate? Yes; Fluid consistency: Thin  aspirin 325 mg daily prior to admission, now on ASA 325mg  daily S/P TPA  Ongoing  aggressive stroke risk factor management  Therapy recommendations:  SNF at family request  Disposition:  SNF, patient and daughter do not want CIR at this time  Hypertension  Stable  Permissive hypertension (OK if < 220/120) but gradually normalize in 5-7 days  Long-term BP goal normotensive  Hyperlipidemia  Home meds: No lipid lowering medications prior to admission  LDL 130, goal < 70  Add Pravachol 20 mg daily  Continue statin at discharge  Other Stroke Risk Factors  Advanced age  Former cigarette smoker - quit almost 60 years ago.  Atrial fibrillation - not anticoagulated, will defer to Cardiology to decide plan for [facility] therapy  Other Active Problems  Multiple food allergies  Bradycardia - (Will check TSH -> 2.35 WNL).  Discussed bradycardia with cardiology. No plans to treat bradycardia that occurs while sleeping. Would consider pacemaker only for symptomatic bradycardia while awake.   Hearing-impaired  Elevated BUN and creatinine -> improving - possibly dehydrated.  Hypokalemia - 2.9 ->  supplemented -> repeat labs K+ 4.0- Resolved  Plan / Recommendations   Stroke workup - completed at this time  Awaiting bed availability and family decision on [facility]  Hospital day # [place], [place] Neurology Stroke Team 10/31/2017, 12:38 PM    To contact Stroke Continuity provider, please refer to [email or web address] After hours, contact General Neurology

## 2017-10-31 NOTE — Plan of Care (Signed)
  Progressing Education: Knowledge of disease or condition will improve 10/31/2017 1022 - Progressing by Harlin Heys, RN Knowledge of secondary prevention will improve 10/31/2017 1022 - Progressing by Harlin Heys, RN Knowledge of patient specific risk factors addressed and post discharge goals established will improve 10/31/2017 1022 - Progressing by Harlin Heys, RN Coping: Will verbalize positive feelings about self 10/31/2017 1022 - Progressing by Harlin Heys, RN Will identify appropriate support needs 10/31/2017 1022 - Progressing by Harlin Heys, [place] Behavior/Discharge Planning: Ability to manage health-related needs will improve 10/31/2017 1022 - Progressing by Harlin Heys, RN Self-Care: Ability to participate in self-care as condition permits will improve 10/31/2017 1022 - Progressing by Debe Coder, Seirra Kos, RN Verbalization of feelings and concerns over difficulty with self-care will improve 10/31/2017 1022 - Progressing by Harlin Heys, RN Ability to communicate needs accurately will improve 10/31/2017 1022 - Progressing by Harlin Heys, RN Nutrition: Risk of aspiration will decrease 10/31/2017 1022 - Progressing by Harlin Heys, RN Dietary intake will improve 10/31/2017 1022 - Progressing by Harlin Heys, RN Ischemic Stroke/TIA Tissue Perfusion: Complications of ischemic stroke/TIA will be minimized 10/31/2017 1022 - Progressing by Harlin Heys, RN Education: Knowledge of General Education information will improve 10/31/2017 1022 - Progressing by Harlin Heys, [place] Behavior/Discharge Planning: Ability to manage health-related needs will improve 10/31/2017 1022 - Progressing by Harlin Heys, RN Clinical Measurements: Ability to maintain clinical measurements within normal limits will improve 10/31/2017 1022 - Progressing by Harlin Heys, RN Will remain free from infection 10/31/2017 1022 - Progressing by Harlin Heys,  RN Diagnostic test results will improve 10/31/2017 1022 - Progressing by Harlin Heys, RN Respiratory complications will improve 10/31/2017 1022 - Progressing by Harlin Heys, RN Cardiovascular complication will be avoided 10/31/2017 1022 - Progressing by Harlin Heys, RN Activity: Risk for activity intolerance will decrease 10/31/2017 1022 - Progressing by Harlin Heys, RN Nutrition: Adequate nutrition will be maintained 10/31/2017 1022 - Progressing by Harlin Heys, RN Coping: Level of anxiety will decrease 10/31/2017 1022 - Progressing by Harlin Heys, RN Elimination: Will not experience complications related to bowel motility 10/31/2017 1022 - Progressing by Harlin Heys, RN Will not experience complications related to urinary retention 10/31/2017 1022 - Progressing by Harlin Heys, RN Pain Managment: General experience of comfort will improve 10/31/2017 1022 - Progressing by Harlin Heys, RN Safety: Ability to remain free from injury will improve 10/31/2017 1022 - Progressing by Harlin Heys, RN Skin Integrity: Risk for impaired skin integrity will decrease 10/31/2017 1022 - Progressing by Harlin Heys, RN

## 2017-10-31 NOTE — Progress Notes (Signed)
Report attempted to [facility], will try again later.

## 2017-10-31 NOTE — Progress Notes (Addendum)
Patient awaiting transportation to [facility]. At 2100 transportation still not here will give patient his Isosorbride Diniitrate 10 mg.

## 2017-11-11 ENCOUNTER — Ambulatory Visit: Payer: Medicare Other | Admitting: Internal Medicine

## 2017-11-18 ENCOUNTER — Other Ambulatory Visit: Payer: Self-pay | Admitting: Internal Medicine

## 2017-11-18 NOTE — Telephone Encounter (Signed)
That is not on current or history list and not typically used in elderly patients  Please find out more from her re: why does she want that Rx

## 2017-11-18 NOTE — Telephone Encounter (Signed)
I spoke with [facility] in [place] [phone] he currently is. They said hopefully he will go home soon like next Tuesday.  They are discharging him on the reglan rx and they have already sent it in to the [facility] as requested. I was unable to speak to the daughter. His wife answered the home # and said she was at the nursing center but they couldn't locate her for me to speak with.

## 2017-11-18 NOTE — Telephone Encounter (Signed)
Please advise Sir, thank you. 

## 2017-11-21 NOTE — Telephone Encounter (Signed)
Patient daughter stating pt is still having gi symptoms and wants to know what pt can do since Dr.Gessner next open is 5.9.19. Pt had stroke at last appt.

## 2017-11-21 NOTE — Telephone Encounter (Signed)
Left message on machine to call back  

## 2017-11-22 NOTE — Telephone Encounter (Signed)
I spoke with the patient's daughter.  She states that they started the reglan due to gas and bloating and constipation.  He is not taking Miralax.  She will have him resume Miralax 1-2 times a day.  Use the reglan as needed for severe bloating after a meal.  Increase the amount of fluid he is drinking and add benefiber as well.  They will call back if he has continued problems.

## 2017-11-29 ENCOUNTER — Inpatient Hospital Stay (HOSPITAL_COMMUNITY)
Admission: EM | Admit: 2017-11-29 | Discharge: 2017-12-07 | DRG: 435 | Disposition: A | Discharge status: Hospice | Payer: Medicare Other | Attending: Internal Medicine | Admitting: Internal Medicine

## 2017-11-29 ENCOUNTER — Emergency Department (HOSPITAL_COMMUNITY): Payer: Medicare Other

## 2017-11-29 ENCOUNTER — Encounter (HOSPITAL_COMMUNITY): Payer: Self-pay | Admitting: Emergency Medicine

## 2017-11-29 DIAGNOSIS — K8689 Other specified diseases of pancreas: Secondary | ICD-10-CM

## 2017-11-29 DIAGNOSIS — R070 Pain in throat: Secondary | ICD-10-CM | POA: Diagnosis not present

## 2017-11-29 DIAGNOSIS — Z885 Allergy status to narcotic agent status: Secondary | ICD-10-CM

## 2017-11-29 DIAGNOSIS — Z833 Family history of diabetes mellitus: Secondary | ICD-10-CM

## 2017-11-29 DIAGNOSIS — M542 Cervicalgia: Secondary | ICD-10-CM | POA: Diagnosis not present

## 2017-11-29 DIAGNOSIS — Z8371 Family history of colonic polyps: Secondary | ICD-10-CM

## 2017-11-29 DIAGNOSIS — K0889 Other specified disorders of teeth and supporting structures: Secondary | ICD-10-CM | POA: Diagnosis not present

## 2017-11-29 DIAGNOSIS — K5909 Other constipation: Secondary | ICD-10-CM | POA: Diagnosis present

## 2017-11-29 DIAGNOSIS — D649 Anemia, unspecified: Secondary | ICD-10-CM | POA: Diagnosis present

## 2017-11-29 DIAGNOSIS — E785 Hyperlipidemia, unspecified: Secondary | ICD-10-CM | POA: Diagnosis present

## 2017-11-29 DIAGNOSIS — E872 Acidosis: Secondary | ICD-10-CM | POA: Diagnosis present

## 2017-11-29 DIAGNOSIS — K311 Adult hypertrophic pyloric stenosis: Secondary | ICD-10-CM | POA: Diagnosis not present

## 2017-11-29 DIAGNOSIS — Z800 Family history of malignant neoplasm of digestive organs: Secondary | ICD-10-CM

## 2017-11-29 DIAGNOSIS — T402X5A Adverse effect of other opioids, initial encounter: Secondary | ICD-10-CM | POA: Diagnosis not present

## 2017-11-29 DIAGNOSIS — Z4659 Encounter for fitting and adjustment of other gastrointestinal appliance and device: Secondary | ICD-10-CM

## 2017-11-29 DIAGNOSIS — E039 Hypothyroidism, unspecified: Secondary | ICD-10-CM | POA: Diagnosis present

## 2017-11-29 DIAGNOSIS — Z87891 Personal history of nicotine dependence: Secondary | ICD-10-CM

## 2017-11-29 DIAGNOSIS — R451 Restlessness and agitation: Secondary | ICD-10-CM

## 2017-11-29 DIAGNOSIS — K567 Ileus, unspecified: Secondary | ICD-10-CM | POA: Diagnosis present

## 2017-11-29 DIAGNOSIS — M790 Rheumatism, unspecified: Secondary | ICD-10-CM | POA: Diagnosis present

## 2017-11-29 DIAGNOSIS — M25562 Pain in left knee: Secondary | ICD-10-CM | POA: Diagnosis not present

## 2017-11-29 DIAGNOSIS — E860 Dehydration: Secondary | ICD-10-CM | POA: Diagnosis present

## 2017-11-29 DIAGNOSIS — Z9079 Acquired absence of other genital organ(s): Secondary | ICD-10-CM

## 2017-11-29 DIAGNOSIS — E871 Hypo-osmolality and hyponatremia: Secondary | ICD-10-CM | POA: Diagnosis not present

## 2017-11-29 DIAGNOSIS — Z515 Encounter for palliative care: Secondary | ICD-10-CM | POA: Diagnosis present

## 2017-11-29 DIAGNOSIS — R935 Abnormal findings on diagnostic imaging of other abdominal regions, including retroperitoneum: Secondary | ICD-10-CM

## 2017-11-29 DIAGNOSIS — H9190 Unspecified hearing loss, unspecified ear: Secondary | ICD-10-CM | POA: Diagnosis present

## 2017-11-29 DIAGNOSIS — Z7982 Long term (current) use of aspirin: Secondary | ICD-10-CM

## 2017-11-29 DIAGNOSIS — Z87442 Personal history of urinary calculi: Secondary | ICD-10-CM

## 2017-11-29 DIAGNOSIS — Z91018 Allergy to other foods: Secondary | ICD-10-CM

## 2017-11-29 DIAGNOSIS — R739 Hyperglycemia, unspecified: Secondary | ICD-10-CM | POA: Diagnosis not present

## 2017-11-29 DIAGNOSIS — Z7189 Other specified counseling: Secondary | ICD-10-CM | POA: Diagnosis not present

## 2017-11-29 DIAGNOSIS — Z66 Do not resuscitate: Secondary | ICD-10-CM | POA: Diagnosis present

## 2017-11-29 DIAGNOSIS — Z8042 Family history of malignant neoplasm of prostate: Secondary | ICD-10-CM

## 2017-11-29 DIAGNOSIS — C259 Malignant neoplasm of pancreas, unspecified: Principal | ICD-10-CM | POA: Diagnosis present

## 2017-11-29 DIAGNOSIS — R634 Abnormal weight loss: Secondary | ICD-10-CM | POA: Diagnosis present

## 2017-11-29 DIAGNOSIS — Y92230 Patient room in hospital as the place of occurrence of the external cause: Secondary | ICD-10-CM | POA: Diagnosis not present

## 2017-11-29 DIAGNOSIS — Z79899 Other long term (current) drug therapy: Secondary | ICD-10-CM

## 2017-11-29 DIAGNOSIS — G92 Toxic encephalopathy: Secondary | ICD-10-CM | POA: Diagnosis not present

## 2017-11-29 DIAGNOSIS — I10 Essential (primary) hypertension: Secondary | ICD-10-CM | POA: Diagnosis present

## 2017-11-29 DIAGNOSIS — Z6821 Body mass index (BMI) 21.0-21.9, adult: Secondary | ICD-10-CM

## 2017-11-29 DIAGNOSIS — K219 Gastro-esophageal reflux disease without esophagitis: Secondary | ICD-10-CM | POA: Diagnosis present

## 2017-11-29 DIAGNOSIS — M109 Gout, unspecified: Secondary | ICD-10-CM | POA: Diagnosis present

## 2017-11-29 DIAGNOSIS — Z79890 Hormone replacement therapy (postmenopausal): Secondary | ICD-10-CM

## 2017-11-29 DIAGNOSIS — K3189 Other diseases of stomach and duodenum: Secondary | ICD-10-CM | POA: Diagnosis present

## 2017-11-29 DIAGNOSIS — Z8673 Personal history of transient ischemic attack (TIA), and cerebral infarction without residual deficits: Secondary | ICD-10-CM

## 2017-11-29 DIAGNOSIS — K869 Disease of pancreas, unspecified: Secondary | ICD-10-CM | POA: Diagnosis not present

## 2017-11-29 DIAGNOSIS — Z8719 Personal history of other diseases of the digestive system: Secondary | ICD-10-CM

## 2017-11-29 DIAGNOSIS — Z8249 Family history of ischemic heart disease and other diseases of the circulatory system: Secondary | ICD-10-CM

## 2017-11-29 DIAGNOSIS — I4891 Unspecified atrial fibrillation: Secondary | ICD-10-CM | POA: Diagnosis present

## 2017-11-29 HISTORY — DX: Other specified diseases of pancreas: K86.89

## 2017-11-29 LAB — TYPE AND SCREEN
ABO/RH(D): O NEG
ANTIBODY SCREEN: NEGATIVE

## 2017-11-29 LAB — URINALYSIS, ROUTINE W REFLEX MICROSCOPIC
BACTERIA UA: NONE SEEN
Bilirubin Urine: NEGATIVE
GLUCOSE, UA: NEGATIVE mg/dL
HGB URINE DIPSTICK: NEGATIVE
Ketones, ur: 5 mg/dL — AB
LEUKOCYTES UA: NEGATIVE
NITRITE: NEGATIVE
PROTEIN: 100 mg/dL — AB
SPECIFIC GRAVITY, URINE: 1.036 — AB (ref 1.005–1.030)
SQUAMOUS EPITHELIAL / LPF: NONE SEEN
pH: 5 (ref 5.0–8.0)

## 2017-11-29 LAB — CBC WITH DIFFERENTIAL/PLATELET
BASOS PCT: 0 %
Basophils Absolute: 0 10*3/uL (ref 0.0–0.1)
Eosinophils Absolute: 0 10*3/uL (ref 0.0–0.7)
Eosinophils Relative: 0 %
HEMATOCRIT: 38.8 % — AB (ref 39.0–52.0)
HEMOGLOBIN: 12.5 g/dL — AB (ref 13.0–17.0)
LYMPHS ABS: 1 10*3/uL (ref 0.7–4.0)
LYMPHS PCT: 12 %
MCH: 31.8 pg (ref 26.0–34.0)
MCHC: 32.2 g/dL (ref 30.0–36.0)
MCV: 98.7 fL (ref 78.0–100.0)
MONO ABS: 0.2 10*3/uL (ref 0.1–1.0)
MONOS PCT: 3 %
NEUTROS ABS: 6.6 10*3/uL (ref 1.7–7.7)
NEUTROS PCT: 85 %
Platelets: 237 10*3/uL (ref 150–400)
RBC: 3.93 MIL/uL — ABNORMAL LOW (ref 4.22–5.81)
RDW: 14.9 % (ref 11.5–15.5)
WBC: 7.7 10*3/uL (ref 4.0–10.5)

## 2017-11-29 LAB — I-STAT CG4 LACTIC ACID, ED: LACTIC ACID, VENOUS: 6.04 mmol/L — AB (ref 0.5–1.9)

## 2017-11-29 LAB — I-STAT TROPONIN, ED: TROPONIN I, POC: 0.02 ng/mL (ref 0.00–0.08)

## 2017-11-29 LAB — ABO/RH: ABO/RH(D): O NEG

## 2017-11-29 LAB — COMPREHENSIVE METABOLIC PANEL
ALT: 11 U/L — ABNORMAL LOW (ref 17–63)
ANION GAP: 19 — AB (ref 5–15)
AST: 41 U/L (ref 15–41)
Albumin: 3.6 g/dL (ref 3.5–5.0)
Alkaline Phosphatase: 155 U/L — ABNORMAL HIGH (ref 38–126)
BILIRUBIN TOTAL: 0.9 mg/dL (ref 0.3–1.2)
BUN: 16 mg/dL (ref 6–20)
CO2: 19 mmol/L — ABNORMAL LOW (ref 22–32)
Calcium: 9.4 mg/dL (ref 8.9–10.3)
Chloride: 98 mmol/L — ABNORMAL LOW (ref 101–111)
Creatinine, Ser: 1.18 mg/dL (ref 0.61–1.24)
GFR calc Af Amer: 56 mL/min — ABNORMAL LOW (ref >60)
GFR, EST NON AFRICAN AMERICAN: 49 mL/min — AB (ref >60)
GLUCOSE: 125 mg/dL — AB (ref 65–99)
POTASSIUM: 3.5 mmol/L (ref 3.5–5.1)
Sodium: 136 mmol/L (ref 135–145)
TOTAL PROTEIN: 7.2 g/dL (ref 6.5–8.1)

## 2017-11-29 LAB — LIPASE, BLOOD: LIPASE: 32 U/L (ref 11–51)

## 2017-11-29 MED ORDER — PANTOPRAZOLE SODIUM 40 MG IV SOLR
40.0000 mg | Freq: Once | INTRAVENOUS | Status: AC
  Administered 2017-11-29: 40 mg via INTRAVENOUS
  Filled 2017-11-29: qty 40

## 2017-11-29 MED ORDER — MORPHINE SULFATE (PF) 4 MG/ML IV SOLN
4.0000 mg | Freq: Once | INTRAVENOUS | Status: AC
  Administered 2017-11-29: 4 mg via INTRAVENOUS
  Filled 2017-11-29: qty 1

## 2017-11-29 MED ORDER — ENOXAPARIN SODIUM 30 MG/0.3ML ~~LOC~~ SOLN: 30.0000 mg | SUBCUTANEOUS | Status: DC

## 2017-11-29 MED ORDER — IOPAMIDOL (ISOVUE-300) INJECTION 61%
100.0000 mL | Freq: Once | INTRAVENOUS | Status: AC | PRN
  Administered 2017-11-29: 100 mL via INTRAVENOUS

## 2017-11-29 MED ORDER — PANTOPRAZOLE SODIUM 40 MG IV SOLR
40.0000 mg | Freq: Two times a day (BID) | INTRAVENOUS | Status: DC
  Administered 2017-11-30 – 2017-12-05 (×11): 40 mg via INTRAVENOUS
  Filled 2017-11-29 (×11): qty 40

## 2017-11-29 MED ORDER — ACETAMINOPHEN 650 MG RE SUPP: 650.0000 mg | RECTAL | Status: DC | PRN

## 2017-11-29 MED ORDER — LEVOTHYROXINE SODIUM 100 MCG IV SOLR
25.0000 ug | Freq: Every day | INTRAVENOUS | Status: DC
  Administered 2017-11-30 – 2017-12-05 (×6): 25 ug via INTRAVENOUS
  Filled 2017-11-29 (×6): qty 5

## 2017-11-29 MED ORDER — LACTATED RINGERS IV BOLUS (SEPSIS)
1000.0000 mL | Freq: Once | INTRAVENOUS | Status: AC
  Administered 2017-11-29: 1000 mL via INTRAVENOUS

## 2017-11-29 MED ORDER — ONDANSETRON HCL 4 MG/2ML IJ SOLN: 4.0000 mg | Freq: Four times a day (QID) | INTRAMUSCULAR | Status: DC | PRN

## 2017-11-29 MED ORDER — MORPHINE SULFATE (PF) 4 MG/ML IV SOLN
2.0000 mg | INTRAVENOUS | Status: DC | PRN
  Administered 2017-12-01 – 2017-12-02 (×3): 2 mg via INTRAVENOUS
  Filled 2017-11-29 (×4): qty 1

## 2017-11-29 MED ORDER — ONDANSETRON HCL 4 MG/2ML IJ SOLN
4.0000 mg | Freq: Once | INTRAMUSCULAR | Status: AC
  Administered 2017-11-29: 4 mg via INTRAVENOUS
  Filled 2017-11-29: qty 2

## 2017-11-29 MED ORDER — SODIUM CHLORIDE 0.9 % IV BOLUS (SEPSIS)
1000.0000 mL | Freq: Once | INTRAVENOUS | Status: AC
  Administered 2017-11-29: 1000 mL via INTRAVENOUS

## 2017-11-29 MED ORDER — MORPHINE SULFATE (PF) 4 MG/ML IV SOLN: 1.0000 mg | INTRAVENOUS | Status: DC | PRN

## 2017-11-29 NOTE — ED Provider Notes (Signed)
[facility] EMERGENCY DEPARTMENT Provider Note   CSN: [id number] Arrival date & time: 11/29/17  1629     History   Chief Complaint Chief Complaint  Patient presents with  . Abdominal Pain    HPI Mario May is a 84 y.o. male no history of HTN, hypothyroidism, constipation, kidney stones, small bowel obstruction is here for evaluation of constant abdominal pain "all over" radiating into chest with worsening abdominal swelling, that started 30 minutes after eating lunch approximately around 12 PM today.  Has a chronic wet sounding cough for several months, per daughter, he has noticed that he's been coughing and spitting up more frequently today. Per EMS patient had one episode of dark brown emesis. Daughter states that patient has had abdominal pain and cough for 2-3 months, he often complains of abdominal pain that they have been trying to treat without narcotic pain medications. She states patient now seems more comfortable and hiswrithing in bed due to the pain. He initially complained ofpain in his chest while at home so he was given 1 nitroglycerin tablet and 3 baby aspirin. Patient takes Protonix. No history of PUD or GI bleed. His last bowel movement was yesterday.  No fevers. Patient denies discomfort with urination.  HPI  Past Medical History:  Diagnosis Date  . Anemia   . Arthritis    "just my legs" (04/06/2013)  . Assistance needed for mobility    walks with walker  . Atrial fibrillation ([place])   . Constipation   . Gout   . HOH (hard of hearing)   . Hyperlipidemia   . Hypertension   . Hypothyroidism   . Kidney stones    "only once" (04/06/2013)  . Lumbar foraminal stenosis 06/04/2016  . 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 Rheumatism   . SIRS (systemic inflammatory response syndrome) ([place]) 02/2013   [name] 03/19/2013  (04/06/2013)  . Small bowel obstruction [facility])     Patient Active Problem List   Diagnosis Date Noted  .  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 CAP (community acquired pneumonia) 08/21/2017  . Neck pain 08/21/2017  . Bradycardia 01/23/2017  . Atrial fibrillation with slow ventricular response ([place]) 01/23/2017  . Dehydration 08/19/2016  . Elevated troponin 08/18/2016  . AKI (acute kidney injury) ([place]) 08/18/2016  . Difficulty in walking, not elsewhere classified   . Gait instability   . Left leg cellulitis 06/05/2016  . Orthostatic hypotension 06/04/2016  . 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 Lumbar foraminal stenosis 06/04/2016  . DJD (degenerative joint disease), multiple sites 06/04/2016  . Fever 06/04/2016  . Leukocytosis 06/04/2016  . Stasis dermatitis of both legs 07/01/2013  . Essential hypertension 03/15/2013  . Hypothyroidism, adult 03/18/2009  . CONSTIPATION 03/18/2009  . CHANGE IN BOWELS 03/18/2009    Past Surgical History:  Procedure Laterality Date  . CYSTOSCOPY W/ STONE MANIPULATION  ?1986  . NASAL FRACTURE SURGERY    . TRANSURETHRAL RESECTION OF PROSTATE         Home Medications    Prior to Admission medications   Medication Sig Start Date End Date Taking? Authorizing Provider  acetaminophen (TYLENOL) 650 MG CR tablet Take 650 mg by mouth every 8 (eight) hours as needed for pain.   Yes [provider]  allopurinol (ZYLOPRIM) 100 MG tablet Take 100 mg by mouth daily.   Yes [provider]  aspirin 325 MG tablet Take 325 mg by mouth daily.   Yes [provider]  dicyclomine (BENTYL)  20 MG tablet Take 1 tablet by mouth every 6 (six) hours as needed.   Yes [provider]  finasteride (PROSCAR) 5 MG tablet Take 5 mg by mouth daily.    Yes [provider]  furosemide (LASIX) 20 MG tablet Take 10 mg by mouth daily.  05/19/16  Yes [provider]  isosorbide dinitrate (ISORDIL) 10 MG tablet Take 10 mg by mouth 2 (two) times daily.   Yes [provider]  levothyroxine (SYNTHROID,  LEVOTHROID) 75 MCG tablet Take 75 mcg by mouth daily before breakfast.   Yes [provider]  meclizine (ANTIVERT) 25 MG tablet Take 25 mg by mouth 3 (three) times daily as needed (inner ear problems).   Yes [provider]  metoCLOPramide (REGLAN) 5 MG tablet Take 1 tablet by mouth 2 (two) times daily. 11/25/17  Yes [provider]  ondansetron (ZOFRAN) 4 MG tablet Take 1 tablet (4 mg total) by mouth every 6 (six) hours as needed. Patient taking differently: Take 4 mg by mouth every 6 (six) hours as needed for nausea or vomiting.  1/61/09  Yes Delora Fuel, MD  pantoprazole (PROTONIX) 40 MG tablet Take 1 tablet (40 mg total) by mouth daily. Patient taking differently: Take 40 mg by mouth every evening.  02/21/53  Yes Delora Fuel, MD  polyethylene glycol [facility] Floria Raveling) packet Take 17 g by mouth daily as needed for mild constipation.    Yes [provider]  Simethicone (GAS-X PO) Take 1 tablet by mouth daily as needed (flatulence).    Yes [provider]  levothyroxine (SYNTHROID, LEVOTHROID) 25 MCG tablet Take 1 tablet by mouth daily. 11/29/17   [provider]  pravastatin (PRAVACHOL) 40 MG tablet Take 1 tablet (40 mg total) by mouth daily at 6 PM. Patient not taking: Reported on 11/29/2017 10/31/17   Mary Sella, NP    Family History Family History  Problem Relation Age of Onset  . Colon cancer Mother   . Prostate cancer Father   . Heart disease Father   . Colon polyps Brother        x 2  . Heart disease Brother     Social History Social History   Tobacco Use  . Smoking status: Former Smoker    Packs/day: 0.50    Types: Cigarettes    Last attempt to quit: 1960    Years since quitting: 59.2  . Smokeless tobacco: Never Used  Substance Use Topics  . Alcohol use: No    Comment: 04/06/2013 "last time I've had any alcohol was 09/12/1945; never had a problem w/it"  . Drug use: No     Allergies   Codeine; Grapefruit  concentrate; Other; and Pineapple   Review of Systems Review of Systems  Respiratory: Positive for cough.   Cardiovascular: Positive for chest pain.  Gastrointestinal: Positive for abdominal pain, constipation (chronic, last BM yesterday), nausea and vomiting.  All other systems reviewed and are negative.    Physical Exam Updated Vital Signs BP 136/71   Pulse (!) 56   Temp (!) 97.5 F (36.4 C) (Rectal)   Resp 13   Ht 5\' 11"  (1.803 m)   Wt 68 kg (150 lb)   SpO2 97%   BMI 20.92 kg/m   Physical Exam  Constitutional: He is oriented to person, place, and time. He appears well-developed and well-nourished. No distress.  Awake, cooperative. Looks uncomfortable  HENT:  Head: Normocephalic and atraumatic.  Nose: Nose normal.  Mouth/Throat: No oropharyngeal  exudate.  Moist mucous membranes   Eyes: Conjunctivae and EOM are normal. Pupils are equal, round, and reactive to light.  Neck: Normal range of motion.  Cardiovascular: Normal rate, regular rhythm, normal heart sounds and intact distal pulses.  No murmur heard. 2+ DP and radial pulses bilaterally. No LE edema.   Pulmonary/Chest: Effort normal. He has decreased breath sounds.  Decreased breath sounds to lower lobes bilaterally, difficult exam due to body habitus and abdominal distention. No reproducible chest wall tenderness.  Abdominal: He exhibits distension. Bowel sounds are increased. There is generalized tenderness. There is rigidity.  No suprapubic or CVA tenderness.   Genitourinary:  Genitourinary Comments: No inguinal tenderness or bulging  Musculoskeletal: Normal range of motion. He exhibits no deformity.  Neurological: He is alert and oriented to person, place, and time.  Skin: Skin is warm and dry. Capillary refill takes less than 2 seconds.  Psychiatric: He has a normal mood and affect. His behavior is normal. Judgment and thought content normal.  Nursing note and vitals reviewed.    ED Treatments / Results    Labs (all labs ordered are listed, but only abnormal results are displayed) Labs Reviewed  CBC WITH DIFFERENTIAL/PLATELET - Abnormal; Notable for the following components:      Result Value   RBC 3.93 (*)    Hemoglobin 12.5 (*)    HCT 38.8 (*)    All other components within normal limits  COMPREHENSIVE METABOLIC PANEL - Abnormal; Notable for the following components:   Chloride 98 (*)    CO2 19 (*)    Glucose, Bld 125 (*)    ALT 11 (*)    Alkaline Phosphatase 155 (*)    GFR calc non Af Amer 49 (*)    GFR calc Af Amer 56 (*)    Anion gap 19 (*)    All other components within normal limits  I-STAT CG4 LACTIC ACID, ED - Abnormal; Notable for the following components:   Lactic Acid, Venous 6.04 (*)    All other components within normal limits  LIPASE, BLOOD  URINALYSIS, ROUTINE W REFLEX MICROSCOPIC  I-STAT TROPONIN, ED  I-STAT CG4 LACTIC ACID, ED  I-STAT CG4 LACTIC ACID, ED  TYPE AND SCREEN  ABO/RH    EKG  EKG Interpretation  Date/Time:  Tuesday November 29 2017 16:31:35 EDT Ventricular Rate:  39 PR Interval:    QRS Duration: 84 QT Interval:  532 QTC Calculation: 428 R Axis:   77 Text Interpretation:  atrial fibrillation, low rate Nonspecific ST abnormality Abnormal ECG No significant change since last tracing Confirmed by Alfonzo Beers [phone]) on 11/29/2017 4:52:16 PM       Radiology Ct Abdomen Pelvis W Contrast  Result Date: 11/29/2017 CLINICAL DATA:  Left upper quadrant pain EXAM: CT ABDOMEN AND PELVIS WITH CONTRAST TECHNIQUE: Multidetector CT imaging of the abdomen and pelvis was performed using the standard protocol following bolus administration of intravenous contrast. CONTRAST:  161mL ISOVUE-300 IOPAMIDOL (ISOVUE-300) INJECTION 61% COMPARISON:  10/05/2017 FINDINGS: Lower chest: Mild dependent atelectatic changes are noted bilaterally. No focal infiltrate is seen. Hepatobiliary: Gallbladder is well distended although no inflammatory changes are seen. The liver is  unremarkable. Pancreas: Body and tail of pancreas are within normal limits. Hypoattenuating mass lesion is again seen in the region of the uncinate process stable from the prior study. Spleen: Normal in size without focal abnormality. Adrenals/Urinary Tract: The adrenal glands are within normal limits. Renal cystic change is noted stable from the prior exam. No obstructive changes  are seen. The bladder is well distended. Stomach/Bowel: The stomach is significantly distended with food stuffs with evidence of fluid within the distal esophagus likely related to reflux. Some mild narrowing is noted in the second portion of the duodenum in the region of the known pancreatic mass. Some local invasion cannot be totally excluded. This is likely the etiology of the significantly distended stomach. Vascular/Lymphatic: Aortic atherosclerosis. No enlarged abdominal or pelvic lymph nodes. Reproductive: Prostate is unremarkable. Other: No abdominal wall hernia or abnormality. No abdominopelvic ascites. Musculoskeletal: Degenerative changes of the lumbar spine are noted. No acute bony abnormality is seen. IMPRESSION: Significantly distended stomach likely related to some narrowing in the second portion of the duodenum. This may be related to the patient's known pancreatic lesion. 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 Chronic changes as described above. Electronically Signed   By: Inez Catalina M.D.   On: 11/29/2017 21:08   Dg Abdomen Acute W/chest  Result Date: 11/29/2017 CLINICAL DATA:  One 84 year old male with abdominal pain. EXAM: DG ABDOMEN ACUTE W/ 1V CHEST COMPARISON:  Abdominal CT dated 10/05/2017 FINDINGS: Left lung base atelectatic changes. The right lung is clear. No large pleural effusion. There is no pneumothorax. Mild cardiomegaly. Atherosclerotic calcification of the aortic arch. There is severe gaseous distention of the stomach which may be related to gastroparesis or represent  gastric outlet obstruction caused by pancreatic mass seen on the prior CT. Further evaluation with CT of the abdomen pelvis recommended. There is air in the colon. No definite free air identified. Bubbly lucencies noted over the stomach may be mixed with gastric content, however gastric pneumatosis is not excluded. Clinical correlation is recommended. There is paucity of small bowel air. There is degenerative changes of the spine. No acute osseous pathology. IMPRESSION: 1. No acute cardiopulmonary process. 2. Severe gaseous distention of the stomach may represent gastroparesis or gastric outlet obstruction caused by pancreatic mass. Further evaluation with CT of the abdomen pelvis recommended. 3. Bubbly lucencies over the stomach may represent air mixed with gastric content or pneumatosis. No definite free air identified. Electronically Signed   By: Anner Crete M.D.   On: 11/29/2017 18:37    Procedures Procedures (including critical care time)  Medications Ordered in ED Medications  ondansetron (ZOFRAN) injection 4 mg (4 mg Intravenous Given 11/29/17 1813)  morphine 4 MG/ML injection 4 mg (4 mg Intravenous Given 11/29/17 1813)  sodium chloride 0.9 % bolus 1,000 mL (0 mLs Intravenous Stopped 11/29/17 2008)  pantoprazole (PROTONIX) injection 40 mg (40 mg Intravenous Given 11/29/17 1813)  iopamidol (ISOVUE-300) 61 % injection 100 mL (100 mLs Intravenous Contrast Given 11/29/17 2032)     Initial Impression / Assessment and Plan / ED Course  I have reviewed the triage vital signs and the nursing notes.  Pertinent labs & imaging results that were available during my care of the patient were reviewed by me and considered in my medical decision making (see chart for details).  Clinical Course as of Nov 30 2222  Tue Nov 29, 2017  2113  Stomach/Bowel: The stomach is significantly distended with food stuffs with evidence of fluid within the distal esophagus likely related to reflux. Some mild narrowing  is noted in the second portion of the duodenum in the region of the known pancreatic mass. Some local invasion cannot be totally excluded. This is likely the etiology of the significantly distended stomach. CT ABDOMEN PELVIS W CONTRAST [CG]  2209 Lactic Acid, Venous: (!!) 6.04 [CG]  Clinical Course User Index [CG] Kinnie Feil, PA-C   Concern for gastritis with hemorrhage, PUD, perforation, SBO, volvulus. We will initiate abdominal pain/chest pain workup, control symptoms and reassessed. He is hemodynamically stable. Discussed plan with daughter at bedside was agreeable.  Final Clinical Impressions(s) / ED Diagnoses   ED lab work and imaging reviewed. Lactic 6.04, CT abdomen/pelvis shows significantly distended stomach with distal obstruction likely from known pancreatic mass.  Discussed CT results with patient and patient's daughter at bedside. Patient's daughter, aware of pancreatic mass, is interested in speaking to surgeon to talk about possible interventions at this time. Daughter is concerned that patient has had abdominal pain for the last 3 months and only worsening. She is interested in pursuing treatments to make her father more comfortable. Unfortunately, multiple attempts to insert NG tube in the ED unsuccessful. Patient is in tears and requesting we stop. I explained to him the purpose of an NG tube to help him feel better, however patient states "go through my stomach" instead. Will re-attempt.   General surgery, Dr. Donne Hazel, consulted for further recommendations. He suggests pt be admitted to hospitalist service. Pt shared with SP.  Final diagnoses:  Gastric distention    ED Discharge Orders    None       Arlean Hopping 11/29/17 2224    Pixie Casino, MD 11/29/17 2231

## 2017-11-29 NOTE — ED Triage Notes (Signed)
Per EMS pt come from home complaining of LUQ abdominal pain.  Pt has a recent history of a stroke where he was seen her last month.  Also has a history of bowel obstruction.   He had one round of dark brown emesis with EMS 5 min before arriving at [facility].  Family thought he was having CP and gave him 1 nitro tablet as well as 3 baby Asprin and Protonix.  Family states this pt is often sinus brady.    Pt is AOx4.

## 2017-11-29 NOTE — ED Notes (Signed)
Admitting Provider at bedside. 

## 2017-11-29 NOTE — ED Notes (Signed)
Patient transported to CT 

## 2017-11-29 NOTE — H&P (Signed)
History and Physical    Mario May SEG:[id number] DOB: 21-Feb-1915 DOA: 11/29/2017  PCP: Dione Housekeeper, MD Patient coming from: Home  I have personally briefly reviewed patient's old medical records in [place]  Chief Complaint: Abdominal pain, distention  HPI: Mario May is a 84 y.o. male with medical history significant for known pancreatic mass, prior history of small bowel obstruction, A. fib not on systemic anticoagulation, hypertension, hypothyroidism and GERD who presents to the ED from home with left-sided abdominal pain, worsening abdominal distention and nausea with vomiting.  Patient has been unable to tolerate solid intake over the last few weeks, and it worsened to the point at which she is unable to keep down any solids or liquids.  He notes multiple episodes of dark brown emesis prior to presentation to the ED.  Patient's known pancreatic mass at the uncinate process was first identified in April 2018.  The mass is being followed expectantly.  CT A/P was obtained in the ED demonstrating significant gastric distention, likely related to narrowing within the second portion of the duodenum.  ED Course: In the ED, patient is afebrile, mildly bradycardic into the 40s, normotensive and saturating comfortably on room air.  Labs are notable for WBC 7.7, Hgb 12.5, platelets 237, normal electrolytes, normal renal function, CO2 19, anion gap 19, lactate 6.0, lipase 32, LFTs within normal limits other than mildly elevated alkaline phosphatase at 155.  Troponin detectable but not elevated at 0.02.  Given the significant gastric distention noted on abdominal x-ray and CT abdomen pelvis, patient was hydrated and an attempt was made to place NG tube.  On initial attempt at NG tube placement failed, placement will be obtained prior to admission to the floor.  General surgery was consulted in the ED; they feel the patient is not a surgical candidate at this time.  Patient stable for  admission to the floor.  Review of Systems: As per HPI otherwise 10 point review of systems negative.   Past Medical History:  Diagnosis Date  . Anemia   . Arthritis    "just my legs" (04/06/2013)  . Assistance needed for mobility    walks with walker  . Atrial fibrillation ([place])   . Constipation   . Gout   . HOH (hard of hearing)   . Hyperlipidemia   . Hypertension   . Hypothyroidism   . Kidney stones    "only once" (04/06/2013)  . Lumbar foraminal stenosis 06/04/2016  . 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 Rheumatism   . SIRS (systemic inflammatory response syndrome) ([place]) 02/2013   [name] 03/19/2013  (04/06/2013)  . Small bowel obstruction [facility])     Past Surgical History:  Procedure Laterality Date  . CYSTOSCOPY W/ STONE MANIPULATION  ?1986  . NASAL FRACTURE SURGERY    . TRANSURETHRAL RESECTION OF PROSTATE       reports that he quit smoking about 59 years ago. His smoking use included cigarettes. He smoked 0.50 packs per day. he has never used smokeless tobacco. He reports that he does not drink alcohol or use drugs.  Allergies  Allergen Reactions  . Codeine Other (See Comments)    Passes out  . Grapefruit Concentrate Other (See Comments)    unknown  . Other Other (See Comments)    Potatoes, green beans, corn - unknown  . Pineapple Other (See Comments)    unknown    Family History  Problem Relation Age of Onset  . Colon cancer Mother   .  Prostate cancer Father   . Heart disease Father   . Colon polyps Brother        x 2  . Heart disease Brother     Prior to Admission medications   Medication Sig Start Date End Date Taking? Authorizing Provider  acetaminophen (TYLENOL) 650 MG CR tablet Take 650 mg by mouth every 8 (eight) hours as needed for pain.   Yes [provider]  allopurinol (ZYLOPRIM) 100 MG tablet Take 100 mg by mouth daily.   Yes [provider]  aspirin 325 MG tablet Take 325 mg by mouth daily.   Yes [provider]  dicyclomine (BENTYL) 20 MG tablet Take 1 tablet by mouth every 6 (six) hours as needed.   Yes [provider]  finasteride (PROSCAR) 5 MG tablet Take 5 mg by mouth daily.    Yes [provider]  furosemide (LASIX) 20 MG tablet Take 10 mg by mouth daily.  05/19/16  Yes [provider]  isosorbide dinitrate (ISORDIL) 10 MG tablet Take 10 mg by mouth 2 (two) times daily.   Yes [provider]  levothyroxine (SYNTHROID, LEVOTHROID) 75 MCG tablet Take 75 mcg by mouth daily before breakfast.   Yes [provider]  meclizine (ANTIVERT) 25 MG tablet Take 25 mg by mouth 3 (three) times daily as needed (inner ear problems).   Yes [provider]  metoCLOPramide (REGLAN) 5 MG tablet Take 1 tablet by mouth 2 (two) times daily. 11/25/17  Yes [provider]  ondansetron (ZOFRAN) 4 MG tablet Take 1 tablet (4 mg total) by mouth every 6 (six) hours as needed. Patient taking differently: Take 4 mg by mouth every 6 (six) hours as needed for nausea or vomiting.  9/37/90  Yes Delora Fuel, MD  pantoprazole (PROTONIX) 40 MG tablet Take 1 tablet (40 mg total) by mouth daily. Patient taking differently: Take 40 mg by mouth every evening.  2/40/97  Yes Delora Fuel, MD  polyethylene glycol [facility] / Floria Raveling) packet Take 17 g by mouth daily as needed for mild constipation.    Yes [provider]  Simethicone (GAS-X PO) Take 1 tablet by mouth daily as needed (flatulence).    Yes [provider]  levothyroxine (SYNTHROID, LEVOTHROID) 25 MCG tablet Take 1 tablet by mouth daily. 11/29/17   [provider]  pravastatin (PRAVACHOL) 40 MG tablet Take 1 tablet (40 mg total) by mouth daily at 6 PM. Patient not taking: Reported on 11/29/2017 10/31/17   Mary Sella, NP    Physical Exam: Vitals:   11/29/17 1930 11/29/17 1956 11/29/17 2000 11/29/17 2015  BP: 137/69  (!) 147/67 136/71  Pulse: 63  (!) 56 (!) 56  Resp: 20  13  13   Temp:  (!) 97.5 F (36.4 C)    TempSrc:  Rectal    SpO2: 98%  97% 97%  Weight:      Height:        Constitutional: NAD, calm, comfortable Eyes: PERRL, lids and conjunctivae normal ENMT: Mucous membranes are dry. Neck: normal, supple, no masses Respiratory: clear to auscultation bilaterally, no wheezing, no crackles. Normal respiratory effort. Cardiovascular: Bradycardic, no murmurs / rubs / gallops. No extremity edema. 1+ pedal pulses. Abdomen: Distended abdomen. Non-tender to palpation. Positive bowel sounds. No palpable mass.  Musculoskeletal: no clubbing / cyanosis. No joint deformity upper and lower extremities. Skin: no rashes, lesions, ulcers. Neurologic: CN 2-12 grossly intact. Strength 5/5 in all 4.  Psychiatric: Normal judgment and insight.  Alert and oriented.  Labs on Admission: I have personally reviewed following labs and imaging studies  CBC: Recent Labs  Lab 11/29/17 1800  WBC 7.7  NEUTROABS 6.6  HGB 12.5*  HCT 38.8*  MCV 98.7  PLT 160   Basic Metabolic Panel: Recent Labs  Lab 11/29/17 1800  NA 136  K 3.5  CL 98*  CO2 19*  GLUCOSE 125*  BUN 16  CREATININE 1.18  CALCIUM 9.4   GFR: Estimated Creatinine Clearance: 32 mL/min (by C-G formula based on SCr of 1.18 mg/dL). Liver Function Tests: Recent Labs  Lab 11/29/17 1800  AST 41  ALT 11*  ALKPHOS 155*  BILITOT 0.9  PROT 7.2  ALBUMIN 3.6   Recent Labs  Lab 11/29/17 1800  LIPASE 32   No results for input(s): AMMONIA in the last 168 hours. Coagulation Profile: No results for input(s): INR, PROTIME in the last 168 hours. Cardiac Enzymes: No results for input(s): CKTOTAL, CKMB, CKMBINDEX, TROPONINI in the last 168 hours. BNP (last 3 results) No results for input(s): PROBNP in the last 8760 hours. HbA1C: No results for input(s): HGBA1C in the last 72 hours. CBG: No results for input(s): GLUCAP in the last 168 hours. Lipid Profile: No results for input(s): CHOL, HDL, LDLCALC, TRIG,  CHOLHDL, LDLDIRECT in the last 72 hours. Thyroid Function Tests: No results for input(s): TSH, T4TOTAL, FREET4, T3FREE, THYROIDAB in the last 72 hours. Anemia Panel: No results for input(s): VITAMINB12, FOLATE, FERRITIN, TIBC, IRON, RETICCTPCT in the last 72 hours. Urine analysis:    Component Value Date/Time   COLORURINE YELLOW 10/06/2017 0105   APPEARANCEUR CLEAR 10/06/2017 0105   LABSPEC 1.018 10/06/2017 0105   PHURINE 5.0 10/06/2017 0105   GLUCOSEU NEGATIVE 10/06/2017 0105   HGBUR NEGATIVE 10/06/2017 0105   BILIRUBINUR NEGATIVE 10/06/2017 0105   KETONESUR NEGATIVE 10/06/2017 0105   PROTEINUR NEGATIVE 10/06/2017 0105   UROBILINOGEN 0.2 10/02/2013 2114   NITRITE NEGATIVE 10/06/2017 0105   LEUKOCYTESUR SMALL (A) 10/06/2017 0105    Radiological Exams on Admission: Ct Abdomen Pelvis W Contrast  Result Date: 11/29/2017 CLINICAL DATA:  Left upper quadrant pain EXAM: CT ABDOMEN AND PELVIS WITH CONTRAST TECHNIQUE: Multidetector CT imaging of the abdomen and pelvis was performed using the standard protocol following bolus administration of intravenous contrast. CONTRAST:  167mL ISOVUE-300 IOPAMIDOL (ISOVUE-300) INJECTION 61% COMPARISON:  10/05/2017 FINDINGS: Lower chest: Mild dependent atelectatic changes are noted bilaterally. No focal infiltrate is seen. Hepatobiliary: Gallbladder is well distended although no inflammatory changes are seen. The liver is unremarkable. Pancreas: Body and tail of pancreas are within normal limits. Hypoattenuating mass lesion is again seen in the region of the uncinate process stable from the prior study. Spleen: Normal in size without focal abnormality. Adrenals/Urinary Tract: The adrenal glands are within normal limits. Renal cystic change is noted stable from the prior exam. No obstructive changes are seen. The bladder is well distended. Stomach/Bowel: The stomach is significantly distended with food stuffs with evidence of fluid within the distal esophagus  likely related to reflux. Some mild narrowing is noted in the second portion of the duodenum in the region of the known pancreatic mass. Some local invasion cannot be totally excluded. This is likely the etiology of the significantly distended stomach. Vascular/Lymphatic: Aortic atherosclerosis. No enlarged abdominal or pelvic lymph nodes. Reproductive: Prostate is unremarkable. Other: No abdominal wall hernia or abnormality. No abdominopelvic ascites. Musculoskeletal: Degenerative changes of the lumbar spine are noted. No acute bony abnormality is seen. IMPRESSION: Significantly distended stomach likely  related to some narrowing in the second portion of the duodenum. This may be related to the patient's known pancreatic lesion. 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 Chronic changes as described above. Electronically Signed   By: Inez Catalina M.D.   On: 11/29/2017 21:08   Dg Abdomen Acute W/chest  Result Date: 11/29/2017 CLINICAL DATA:  One 84 year old male with abdominal pain. EXAM: DG ABDOMEN ACUTE W/ 1V CHEST COMPARISON:  Abdominal CT dated 10/05/2017 FINDINGS: Left lung base atelectatic changes. The right lung is clear. No large pleural effusion. There is no pneumothorax. Mild cardiomegaly. Atherosclerotic calcification of the aortic arch. There is severe gaseous distention of the stomach which may be related to gastroparesis or represent gastric outlet obstruction caused by pancreatic mass seen on the prior CT. Further evaluation with CT of the abdomen pelvis recommended. There is air in the colon. No definite free air identified. Bubbly lucencies noted over the stomach may be mixed with gastric content, however gastric pneumatosis is not excluded. Clinical correlation is recommended. There is paucity of small bowel air. There is degenerative changes of the spine. No acute osseous pathology. IMPRESSION: 1. No acute cardiopulmonary process. 2. Severe gaseous distention of the  stomach may represent gastroparesis or gastric outlet obstruction caused by pancreatic mass. Further evaluation with CT of the abdomen pelvis recommended. 3. Bubbly lucencies over the stomach may represent air mixed with gastric content or pneumatosis. No definite free air identified. Electronically Signed   By: Anner Crete M.D.   On: 11/29/2017 18:37    Assessment/Plan Active Problems:   Gastric outlet obstruction  Gastric outlet obstruction 2/2 mechanical compression by pancreatic mass - Will place NGT for decompression - Keep pt strict NPO - Continue hydration - General Surgery consulted, pt is unlikely to be surgical candidate - Would consult Gastroenterology in AM for possible stenting - Consider Palliative consult - Pain control with IV morphine PRN - Zofran PRN for nausea - Hold all non-essential medications  Lactic acidosis In the setting of acute nausea and vomiting with dehydration. Pt is not toxic appearing on exam. He is on no medications that can precipitate lactic acidosis. - Trend lactate to close - Hydrate patient as above  Hypothyroidism - Check TSH - Switch to IV Synthroid  GERD Questionable report of coffee ground emesis Hgb is stable and no complaints of melena. - Will start IV PPI BID initially, anticipate rapid deescalation - GI consult as above  DVT prophylaxis: SQH Code Status: Full; family will discuss further with patient Family Communication: Daughter, Kathrine Cords Disposition Plan: PT eval; consider Hospice/Palliative consult Consults called: General Surgery Donne Hazel) Admission status: Inpatient   Adam Sharene Butters MD [facility]  If 7PM-7AM, please contact night-coverage [email or web address] Password [facility]  11/29/2017, 11:13 PM

## 2017-11-30 ENCOUNTER — Encounter (HOSPITAL_COMMUNITY): Payer: Self-pay | Admitting: General Practice

## 2017-11-30 ENCOUNTER — Inpatient Hospital Stay (HOSPITAL_COMMUNITY): Payer: Medicare Other

## 2017-11-30 ENCOUNTER — Other Ambulatory Visit: Payer: Self-pay

## 2017-11-30 DIAGNOSIS — K311 Adult hypertrophic pyloric stenosis: Secondary | ICD-10-CM

## 2017-11-30 DIAGNOSIS — R935 Abnormal findings on diagnostic imaging of other abdominal regions, including retroperitoneum: Secondary | ICD-10-CM

## 2017-11-30 DIAGNOSIS — K869 Disease of pancreas, unspecified: Secondary | ICD-10-CM

## 2017-11-30 LAB — COMPREHENSIVE METABOLIC PANEL
ALK PHOS: 132 U/L — AB (ref 38–126)
ALT: 10 U/L — AB (ref 17–63)
AST: 35 U/L (ref 15–41)
Albumin: 3.1 g/dL — ABNORMAL LOW (ref 3.5–5.0)
Anion gap: 11 (ref 5–15)
BUN: 17 mg/dL (ref 6–20)
CO2: 21 mmol/L — ABNORMAL LOW (ref 22–32)
CREATININE: 1.08 mg/dL (ref 0.61–1.24)
Calcium: 8.9 mg/dL (ref 8.9–10.3)
Chloride: 103 mmol/L (ref 101–111)
GFR, EST NON AFRICAN AMERICAN: 54 mL/min — AB (ref >60)
Glucose, Bld: 97 mg/dL (ref 65–99)
Potassium: 4 mmol/L (ref 3.5–5.1)
Sodium: 135 mmol/L (ref 135–145)
Total Bilirubin: 1.1 mg/dL (ref 0.3–1.2)
Total Protein: 6.4 g/dL — ABNORMAL LOW (ref 6.5–8.1)

## 2017-11-30 LAB — TSH: TSH: 4.243 u[IU]/mL (ref 0.350–4.500)

## 2017-11-30 LAB — CBC
HCT: 36.3 % — ABNORMAL LOW (ref 39.0–52.0)
HEMOGLOBIN: 12.1 g/dL — AB (ref 13.0–17.0)
MCH: 32.8 pg (ref 26.0–34.0)
MCHC: 33.3 g/dL (ref 30.0–36.0)
MCV: 98.4 fL (ref 78.0–100.0)
Platelets: 202 10*3/uL (ref 150–400)
RBC: 3.69 MIL/uL — AB (ref 4.22–5.81)
RDW: 15 % (ref 11.5–15.5)
WBC: 10.5 10*3/uL (ref 4.0–10.5)

## 2017-11-30 LAB — PROTIME-INR
INR: 1.07
Prothrombin Time: 13.8 seconds (ref 11.4–15.2)

## 2017-11-30 LAB — LACTIC ACID, PLASMA
LACTIC ACID, VENOUS: 2.2 mmol/L — AB (ref 0.5–1.9)
LACTIC ACID, VENOUS: 1.3 mmol/L (ref 0.5–1.9)
Lactic Acid, Venous: 2.2 mmol/L (ref 0.5–1.9)

## 2017-11-30 LAB — I-STAT CG4 LACTIC ACID, ED: LACTIC ACID, VENOUS: 2.22 mmol/L — AB (ref 0.5–1.9)

## 2017-11-30 LAB — PHOSPHORUS: Phosphorus: 3.5 mg/dL (ref 2.5–4.6)

## 2017-11-30 LAB — CBG MONITORING, ED: GLUCOSE-CAPILLARY: 96 mg/dL (ref 65–99)

## 2017-11-30 LAB — MAGNESIUM: MAGNESIUM: 1.9 mg/dL (ref 1.7–2.4)

## 2017-11-30 LAB — APTT: aPTT: 27 seconds (ref 24–36)

## 2017-11-30 MED ORDER — SODIUM CHLORIDE 0.9 % IV SOLN
INTRAVENOUS | Status: DC
  Administered 2017-11-30 – 2017-12-02 (×4): via INTRAVENOUS

## 2017-11-30 MED ORDER — SODIUM CHLORIDE 0.9 % IV BOLUS (SEPSIS)
250.0000 mL | Freq: Once | INTRAVENOUS | Status: AC
  Administered 2017-11-30: 250 mL via INTRAVENOUS

## 2017-11-30 NOTE — Consult Note (Signed)
Reason for Consult:abd pain Referring Physician: Dr Nickie Retort is an 84 y.o. male.  HPI: 28 yom with expected comorbidities presented to er with progressive decreased food intake over several weeks that was unable to tolerate PO.  He began having n/v and distention also.  He was seen in er and evaluated.  He undewrent xray and ct scan taht shows likely GOO possibly related to uncinate process mass which has been present.  He has ng tube and feels much better now.  He is somnolent on my evaluation and no one else present in room    Past Medical History:  Diagnosis Date  . Anemia   . Arthritis    "just my legs" (04/06/2013)  . Assistance needed for mobility    walks with walker  . Atrial fibrillation ([place])   . Constipation   . Gout   . HOH (hard of hearing)   . Hyperlipidemia   . Hypertension   . Hypothyroidism   . Kidney stones    "only once" (04/06/2013)  . Lumbar foraminal stenosis 06/04/2016  . 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 Rheumatism   . SIRS (systemic inflammatory response syndrome) ([place]) 02/2013   [name] 03/19/2013  (04/06/2013)  . Small bowel obstruction [facility])     Past Surgical History:  Procedure Laterality Date  . CYSTOSCOPY W/ STONE MANIPULATION  ?1986  . NASAL FRACTURE SURGERY    . TRANSURETHRAL RESECTION OF PROSTATE      Family History  Problem Relation Age of Onset  . Colon cancer Mother   . Prostate cancer Father   . Heart disease Father   . Colon polyps Brother        x 2  . Heart disease Brother     Social History:  reports that he quit smoking about 59 years ago. His smoking use included cigarettes. He smoked 0.50 packs per day. he has never used smokeless tobacco. He reports that he does not drink alcohol or use drugs.  Allergies:  Allergies  Allergen Reactions  . Codeine Other (See Comments)    Passes out  . Grapefruit Concentrate Other (See Comments)    unknown  . Other Other (See Comments)    Potatoes,  green beans, corn - unknown  . Pineapple Other (See Comments)    unknown    Medications: I have reviewed the patient's current medications.  Results for orders placed or performed during the hospital encounter of 11/29/17 (from the past 48 hour(s))  CBC with Differential     Status: Abnormal   Collection Time: 11/29/17  6:00 PM  Result Value Ref Range   WBC 7.7 4.0 - 10.5 K/uL   RBC 3.93 (L) 4.22 - 5.81 MIL/uL   Hemoglobin 12.5 (L) 13.0 - 17.0 g/dL   HCT 38.8 (L) 39.0 - 52.0 %   MCV 98.7 78.0 - 100.0 fL   MCH 31.8 26.0 - 34.0 pg   MCHC 32.2 30.0 - 36.0 g/dL   RDW 14.9 11.5 - 15.5 %   Platelets 237 150 - 400 K/uL   Neutrophils Relative % 85 %   Neutro Abs 6.6 1.7 - 7.7 K/uL   Lymphocytes Relative 12 %   Lymphs Abs 1.0 0.7 - 4.0 K/uL   Monocytes Relative 3 %   Monocytes Absolute 0.2 0.1 - 1.0 K/uL   Eosinophils Relative 0 %   Eosinophils Absolute 0.0 0.0 - 0.7 K/uL   Basophils Relative 0 %   Basophils Absolute 0.0 0.0 -  0.1 K/uL    Comment: Performed at [facility], [place] [address], [place], [place] [address]  Comprehensive metabolic panel     Status: Abnormal   Collection Time: 11/29/17  6:00 PM  Result Value Ref Range   Sodium 136 135 - 145 mmol/L   Potassium 3.5 3.5 - 5.1 mmol/L   Chloride 98 (L) 101 - 111 mmol/L   CO2 19 (L) 22 - 32 mmol/L   Glucose, Bld 125 (H) 65 - 99 mg/dL   BUN 16 6 - 20 mg/dL   Creatinine, Ser 1.18 0.61 - 1.24 mg/dL   Calcium 9.4 8.9 - 10.3 mg/dL   Total Protein 7.2 6.5 - 8.1 g/dL   Albumin 3.6 3.5 - 5.0 g/dL   AST 41 15 - 41 U/L   ALT 11 (L) 17 - 63 U/L   Alkaline Phosphatase 155 (H) 38 - 126 U/L   Total Bilirubin 0.9 0.3 - 1.2 mg/dL   GFR calc non Af Amer 49 (L) >60 mL/min   GFR calc Af Amer 56 (L) >60 mL/min    Comment: (NOTE) The eGFR has been calculated using the CKD EPI equation. This calculation has not been validated in all clinical situations. eGFR's persistently <60 mL/min signify possible Chronic Kidney Disease.    Anion  gap 19 (H) 5 - 15    Comment: Performed at [facility], [place] [address], [place], [place] [address]  Lipase, blood     Status: None   Collection Time: 11/29/17  6:00 PM  Result Value Ref Range   Lipase 32 11 - 51 U/L    Comment: Performed at [facility] [address], [place], [place] [address]  Type and screen [place]     Status: None   Collection Time: 11/29/17  6:35 PM  Result Value Ref Range   ABO/RH(D) O NEG    Antibody Screen NEG    Sample Expiration      12/02/2017 Performed at [facility], [place] [address], [place], [place] [address]   ABO/Rh     Status: None   Collection Time: 11/29/17  6:35 PM  Result Value Ref Range   ABO/RH(D)      O NEG Performed at [facility] [address], [place],  [address]   I-stat troponin, ED     Status: None   Collection Time: 11/29/17  6:50 PM  Result Value Ref Range   Troponin i, poc 0.02 0.00 - 0.08 ng/mL   Comment 3            Comment: Due to the release kinetics of cTnI, a negative result within the first hours of the onset of symptoms does not rule out myocardial infarction with certainty. If myocardial infarction is still suspected, repeat the test at appropriate intervals.   I-Stat CG4 Lactic Acid, ED     Status: Abnormal   Collection Time: 11/29/17  6:52 PM  Result Value Ref Range   Lactic Acid, Venous 6.04 (HH) 0.5 - 1.9 mmol/L   Comment NOTIFIED PHYSICIAN   Urinalysis, Routine w reflex microscopic     Status: Abnormal   Collection Time: 11/29/17 10:15 PM  Result Value Ref Range   Color, Urine YELLOW YELLOW   APPearance CLEAR CLEAR   Specific Gravity, Urine 1.036 (H) 1.005 - 1.030   pH 5.0 5.0 - 8.0   Glucose, UA NEGATIVE NEGATIVE mg/dL   Hgb urine dipstick NEGATIVE NEGATIVE  Bilirubin Urine NEGATIVE NEGATIVE   Ketones, ur 5 (A) NEGATIVE mg/dL   Protein, ur 100 (A) NEGATIVE mg/dL   Nitrite NEGATIVE NEGATIVE   Leukocytes, UA NEGATIVE NEGATIVE   RBC / HPF 0-5  0 - 5 RBC/hpf   WBC, UA 0-5 0 - 5 WBC/hpf   Bacteria, UA NONE SEEN NONE SEEN   Squamous Epithelial / LPF NONE SEEN NONE SEEN   Mucus PRESENT    Hyaline Casts, UA PRESENT     Comment: Performed at [facility], [place] [address] [place], [place] [address]  Lactic acid, plasma     Status: Abnormal   Collection Time: 11/29/17 11:52 PM  Result Value Ref Range   Lactic Acid, Venous 2.2 (HH) 0.5 - 1.9 mmol/L    Comment: CRITICAL RESULT CALLED TO, READ BACK BY AND VERIFIED WITHKathryne Sharper [id number] 11/30/17 D BRADLEY Performed at [facility] [address], [place], [place] [address]   I-Stat CG4 Lactic Acid, ED     Status: Abnormal   Collection Time: 11/30/17 12:01 AM  Result Value Ref Range   Lactic Acid, Venous 2.22 (HH) 0.5 - 1.9 mmol/L   Comment NOTIFIED PHYSICIAN   Magnesium     Status: None   Collection Time: 11/30/17 12:07 AM  Result Value Ref Range   Magnesium 1.9 1.7 - 2.4 mg/dL    Comment: Performed at [facility], [place] [address] [place], [place] [address]  Phosphorus     Status: None   Collection Time: 11/30/17 12:07 AM  Result Value Ref Range   Phosphorus 3.5 2.5 - 4.6 mg/dL    Comment: Performed at [facility] [address] [place], [place] [address]  Protime-INR     Status: None   Collection Time: 11/30/17 12:07 AM  Result Value Ref Range   Prothrombin Time 13.8 11.4 - 15.2 seconds   INR 1.07     Comment: Performed at [facility], [place] [address] [place], [place] [address]  APTT     Status: None   Collection Time: 11/30/17 12:07 AM  Result Value Ref Range   aPTT 27 24 - 36 seconds    Comment: Performed at [facility] [address], [place], [place] [address]  TSH     Status: None   Collection Time: 11/30/17 12:08 AM  Result Value Ref Range   TSH 4.243 0.350 - 4.500 uIU/mL    Comment: Performed by a 3rd Generation assay with a functional sensitivity of <=0.01 uIU/mL. Performed at [facility], [place] [address]  [place], [place] [address]   Lactic acid, plasma     Status: Abnormal   Collection Time: 11/30/17  1:43 AM  Result Value Ref Range   Lactic Acid, Venous 2.2 (HH) 0.5 - 1.9 mmol/L    Comment: CRITICAL RESULT CALLED TO, READ BACK BY AND VERIFIED WITHKathryne Sharper [id number] 11/30/17 D BRADLEY Performed at [facility] [address] [place], [place] [address]   Lactic acid, plasma     Status: None   Collection Time: 11/30/17  4:59 AM  Result Value Ref Range   Lactic Acid, Venous 1.3 0.5 - 1.9 mmol/L    Comment: Performed at [facility] [address], [place],  [address]  CBC     Status: Abnormal   Collection Time: 11/30/17  5:00 AM  Result Value Ref Range   WBC 10.5 4.0 - 10.5 K/uL  RBC 3.69 (L) 4.22 - 5.81 MIL/uL   Hemoglobin 12.1 (L) 13.0 - 17.0 g/dL   HCT 36.3 (L) 39.0 - 52.0 %   MCV 98.4 78.0 - 100.0 fL   MCH 32.8 26.0 - 34.0 pg   MCHC 33.3 30.0 - 36.0 g/dL   RDW 15.0 11.5 - 15.5 %   Platelets 202 150 - 400 K/uL    Comment: Performed at [facility] [address], [place], [place] [address]  Comprehensive metabolic panel     Status: Abnormal   Collection Time: 11/30/17  5:00 AM  Result Value Ref Range   Sodium 135 135 - 145 mmol/L   Potassium 4.0 3.5 - 5.1 mmol/L   Chloride 103 101 - 111 mmol/L   CO2 21 (L) 22 - 32 mmol/L   Glucose, Bld 97 65 - 99 mg/dL   BUN 17 6 - 20 mg/dL   Creatinine, Ser 1.08 0.61 - 1.24 mg/dL   Calcium 8.9 8.9 - 10.3 mg/dL   Total Protein 6.4 (L) 6.5 - 8.1 g/dL   Albumin 3.1 (L) 3.5 - 5.0 g/dL   AST 35 15 - 41 U/L   ALT 10 (L) 17 - 63 U/L   Alkaline Phosphatase 132 (H) 38 - 126 U/L   Total Bilirubin 1.1 0.3 - 1.2 mg/dL   GFR calc non Af Amer 54 (L) >60 mL/min   GFR calc Af Amer >60 >60 mL/min    Comment: (NOTE) The eGFR has been calculated using the CKD EPI equation. This calculation has not been validated in all clinical situations. eGFR's persistently <60 mL/min signify possible Chronic Kidney Disease.    Anion gap 11  5 - 15    Comment: Performed at [facility] [address], [place], [place] [address]    Ct Abdomen Pelvis W Contrast  Result Date: 11/29/2017 CLINICAL DATA:  Left upper quadrant pain EXAM: CT ABDOMEN AND PELVIS WITH CONTRAST TECHNIQUE: Multidetector CT imaging of the abdomen and pelvis was performed using the standard protocol following bolus administration of intravenous contrast. CONTRAST:  137m ISOVUE-300 IOPAMIDOL (ISOVUE-300) INJECTION 61% COMPARISON:  10/05/2017 FINDINGS: Lower chest: Mild dependent atelectatic changes are noted bilaterally. No focal infiltrate is seen. Hepatobiliary: Gallbladder is well distended although no inflammatory changes are seen. The liver is unremarkable. Pancreas: Body and tail of pancreas are within normal limits. Hypoattenuating mass lesion is again seen in the region of the uncinate process stable from the prior study. Spleen: Normal in size without focal abnormality. Adrenals/Urinary Tract: The adrenal glands are within normal limits. Renal cystic change is noted stable from the prior exam. No obstructive changes are seen. The bladder is well distended. Stomach/Bowel: The stomach is significantly distended with food stuffs with evidence of fluid within the distal esophagus likely related to reflux. Some mild narrowing is noted in the second portion of the duodenum in the region of the known pancreatic mass. Some local invasion cannot be totally excluded. This is likely the etiology of the significantly distended stomach. Vascular/Lymphatic: Aortic atherosclerosis. No enlarged abdominal or pelvic lymph nodes. Reproductive: Prostate is unremarkable. Other: No abdominal wall hernia or abnormality. No abdominopelvic ascites. Musculoskeletal: Degenerative changes of the lumbar spine are noted. No acute bony abnormality is seen. IMPRESSION: Significantly distended stomach likely related to some narrowing in the second portion of the duodenum. This may be related to  the patient's known pancreatic lesion. 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 Chronic changes as described above. Electronically Signed  By: Inez Catalina M.D.   On: 11/29/2017 21:08   Dg Abdomen Acute W/chest  Result Date: 11/29/2017 CLINICAL DATA:  One 84 year old male with abdominal pain. EXAM: DG ABDOMEN ACUTE W/ 1V CHEST COMPARISON:  Abdominal CT dated 10/05/2017 FINDINGS: Left lung base atelectatic changes. The right lung is clear. No large pleural effusion. There is no pneumothorax. Mild cardiomegaly. Atherosclerotic calcification of the aortic arch. There is severe gaseous distention of the stomach which may be related to gastroparesis or represent gastric outlet obstruction caused by pancreatic mass seen on the prior CT. Further evaluation with CT of the abdomen pelvis recommended. There is air in the colon. No definite free air identified. Bubbly lucencies noted over the stomach may be mixed with gastric content, however gastric pneumatosis is not excluded. Clinical correlation is recommended. There is paucity of small bowel air. There is degenerative changes of the spine. No acute osseous pathology. IMPRESSION: 1. No acute cardiopulmonary process. 2. Severe gaseous distention of the stomach may represent gastroparesis or gastric outlet obstruction caused by pancreatic mass. Further evaluation with CT of the abdomen pelvis recommended. 3. Bubbly lucencies over the stomach may represent air mixed with gastric content or pneumatosis. No definite free air identified. Electronically Signed   By: Anner Crete M.D.   On: 11/29/2017 18:37   Dg Abd Portable 1 View  Result Date: 11/30/2017 CLINICAL DATA:  One 84 year old male with distention of the stomach and a degree of gastric outlet obstruction. Status post NG tube placement. EXAM: PORTABLE ABDOMEN - 1 VIEW COMPARISON:  Abdominal CT dated 11/29/2017. FINDINGS: There has been interval placement of an enteric tube  with tip and side-port over the stomach. There is continuous distention of the stomach. No free air identified. No acute cardiopulmonary process. IMPRESSION: Interval placement of an enteric tube with tip and side-port over the stomach. Electronically Signed   By: Anner Crete M.D.   On: 11/30/2017 00:23    Review of Systems  Gastrointestinal: Positive for abdominal pain, nausea and vomiting.  All other systems reviewed and are negative.  Blood pressure (!) 151/78, pulse 68, temperature 98.6 F (37 C), temperature source Oral, resp. rate 16, height 5' 11"  (1.803 m), weight 68 kg (150 lb), SpO2 94 %. Physical Exam  Vitals reviewed. Constitutional: He is oriented to person, place, and time. He appears well-developed and well-nourished.  Eyes: No scleral icterus.  Neck: Neck supple.  Cardiovascular: Normal rate and regular rhythm.  Respiratory: Effort normal and breath sounds normal.  GI: Soft. There is no tenderness.  Neurological: He is alert and oriented to person, place, and time.  Skin: Skin is warm and dry.    Assessment/Plan: Gastric outlet obstruction  He has ng and feels better now.  I think this likely needs further evaluation prior to any decision for therapy.  I think either ugi or gi consult with endoscopy would be first choice.  If he indeed has gastric outlet obstruction options would possibly include stenting, gastrostomy or surgery.  He is a candidate for surgery but I dont believe this will likely be the best option for him. I think he would not do well with general anesthesia so attempts to treat this in other ways would be ideal.  We will follow with you.  Agree with discussion of goals of care in this 84 yo with he and his family and including palliative care  Rolm Bookbinder 11/30/2017, 6:06 AM

## 2017-11-30 NOTE — ED Notes (Addendum)
Received critical lactic 2.22 on plasma, per Diamantina Providence RN - admitting MD is already aware of this value.

## 2017-11-30 NOTE — ED Notes (Signed)
Patient is stable and ready to be transport to the floor at this time.  Report was called to [place].  Belongings taken with the patient to the floor.

## 2017-11-30 NOTE — Progress Notes (Signed)
1300 Received pt from [facility] Groggy but easy to arouse. Oriented to person only. Denies pain at this time. Abd is soft, non-tender. NGT to low intermittent suction. Family at the bedside.

## 2017-11-30 NOTE — ED Notes (Signed)
CHECKED PATIENT BLOOD SUGAR IT WAS 96 NOTIFIED rn OF BLOOD SUGAR PATIENT IS RESTING WITH CALL BELL IN REACH

## 2017-11-30 NOTE — Progress Notes (Signed)
[facility] PROGRESS NOTE  Mario May OIN:[id number] DOB: 04-02-1915 DOA: 11/29/2017  PCP: Dione Housekeeper, MD  Brief History/Interval Summary: 84 year old Caucasian male with a past medical history significant for known pancreatic mass, prior history of small bowel obstruction, atrial fibrillation not on systemic anticoagulation, hypertension hypothyroidism presented with abdominal pain distention nausea and vomiting.  He has been unable to tolerate solid foods over the last several weeks.  Patient has lost weight.  Evaluation revealed significant gastric distention.  Narrowing of the second portion of duodenum.  General surgery was consulted.  They do not feel that the patient is a candidate for surgical intervention.  Reason for Visit: Gastric outlet obstruction due to pancreatic mass  Consultants: General surgery.  Gastroenterology will be consulted.  Procedures: None yet  Antibiotics: None  Subjective/Interval History: Patient mildly confused and distracted.  Does not appear to be in any discomfort.  He denies any shortness of breath.  Does admit to some abdominal pain.  NG tube is noted.  ROS: Denies any chest pain.  Objective:  Vital Signs  Vitals:   11/30/17 0500 11/30/17 0501 11/30/17 0600 11/30/17 1000  BP: (!) 187/75 (!) 151/78 (!) 159/77 (!) 154/64  Pulse: (!) 54 68 64 (!) 50  Resp: 14 16 15 15   Temp:  98.6 F (37 C)    TempSrc:  Oral    SpO2: 98% 94% 97% 98%  Weight:      Height:        Intake/Output Summary (Last 24 hours) at 11/30/2017 1039 Last data filed at 11/30/2017 0700 Gross per 24 hour  Intake 2250 ml  Output 925 ml  Net 1325 ml   Filed Weights   11/29/17 1643  Weight: 68 kg (150 lb)    General appearance: alert, cooperative, appears stated age and no distress Head: Normocephalic, without obvious abnormality, atraumatic Resp: clear to auscultation bilaterally Cardio: regular rate and rhythm, S1, S2 normal, no murmur, click, rub  or gallop GI: Abdomen is soft.  Mildly tender in the epigastric area without any rebound rigidity or guarding.  No obvious masses organomegaly appreciated.  Bowel sounds sluggish. Extremities: extremities normal, atraumatic, no cyanosis or edema Pulses: 2+ and symmetric Neurologic: Grossly normal  Lab Results:  Data Reviewed: I have personally reviewed following labs and imaging studies  CBC: Recent Labs  Lab 11/29/17 1800 11/30/17 0500  WBC 7.7 10.5  NEUTROABS 6.6  --   HGB 12.5* 12.1*  HCT 38.8* 36.3*  MCV 98.7 98.4  PLT 237 709    Basic Metabolic Panel: Recent Labs  Lab 11/29/17 1800 11/30/17 0007 11/30/17 0500  NA 136  --  135  K 3.5  --  4.0  CL 98*  --  103  CO2 19*  --  21*  GLUCOSE 125*  --  97  BUN 16  --  17  CREATININE 1.18  --  1.08  CALCIUM 9.4  --  8.9  MG  --  1.9  --   PHOS  --  3.5  --     GFR: Estimated Creatinine Clearance: 35 mL/min (by C-G formula based on SCr of 1.08 mg/dL).  Liver Function Tests: Recent Labs  Lab 11/29/17 1800 11/30/17 0500  AST 41 35  ALT 11* 10*  ALKPHOS 155* 132*  BILITOT 0.9 1.1  PROT 7.2 6.4*  ALBUMIN 3.6 3.1*    Recent Labs  Lab 11/29/17 1800  LIPASE 32   No results for input(s): AMMONIA in the last 168 hours.  Coagulation Profile: Recent Labs  Lab 11/30/17 0007  INR 1.07    Cardiac Enzymes: No results for input(s): CKTOTAL, CKMB, CKMBINDEX, TROPONINI in the last 168 hours.  BNP (last 3 results) No results for input(s): PROBNP in the last 8760 hours.  HbA1C: No results for input(s): HGBA1C in the last 72 hours.  CBG: Recent Labs  Lab 11/30/17 0824  GLUCAP 96    Lipid Profile: No results for input(s): CHOL, HDL, LDLCALC, TRIG, CHOLHDL, LDLDIRECT in the last 72 hours.  Thyroid Function Tests: Recent Labs    11/30/17 0008  TSH 4.243    Anemia Panel: No results for input(s): VITAMINB12, FOLATE, FERRITIN, TIBC, IRON, RETICCTPCT in the last 72 hours.  No results found for this  or any previous visit (from the past 240 hour(s)).    Radiology Studies: Ct Abdomen Pelvis W Contrast  Result Date: 11/29/2017 CLINICAL DATA:  Left upper quadrant pain EXAM: CT ABDOMEN AND PELVIS WITH CONTRAST TECHNIQUE: Multidetector CT imaging of the abdomen and pelvis was performed using the standard protocol following bolus administration of intravenous contrast. CONTRAST:  132mL ISOVUE-300 IOPAMIDOL (ISOVUE-300) INJECTION 61% COMPARISON:  10/05/2017 FINDINGS: Lower chest: Mild dependent atelectatic changes are noted bilaterally. No focal infiltrate is seen. Hepatobiliary: Gallbladder is well distended although no inflammatory changes are seen. The liver is unremarkable. Pancreas: Body and tail of pancreas are within normal limits. Hypoattenuating mass lesion is again seen in the region of the uncinate process stable from the prior study. Spleen: Normal in size without focal abnormality. Adrenals/Urinary Tract: The adrenal glands are within normal limits. Renal cystic change is noted stable from the prior exam. No obstructive changes are seen. The bladder is well distended. Stomach/Bowel: The stomach is significantly distended with food stuffs with evidence of fluid within the distal esophagus likely related to reflux. Some mild narrowing is noted in the second portion of the duodenum in the region of the known pancreatic mass. Some local invasion cannot be totally excluded. This is likely the etiology of the significantly distended stomach. Vascular/Lymphatic: Aortic atherosclerosis. No enlarged abdominal or pelvic lymph nodes. Reproductive: Prostate is unremarkable. Other: No abdominal wall hernia or abnormality. No abdominopelvic ascites. Musculoskeletal: Degenerative changes of the lumbar spine are noted. No acute bony abnormality is seen. IMPRESSION: Significantly distended stomach likely related to some narrowing in the second portion of the duodenum. This may be related to the patient's known  pancreatic lesion. 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 Chronic changes as described above. Electronically Signed   By: Inez Catalina M.D.   On: 11/29/2017 21:08   Dg Abdomen Acute W/chest  Result Date: 11/29/2017 CLINICAL DATA:  One 84 year old male with abdominal pain. EXAM: DG ABDOMEN ACUTE W/ 1V CHEST COMPARISON:  Abdominal CT dated 10/05/2017 FINDINGS: Left lung base atelectatic changes. The right lung is clear. No large pleural effusion. There is no pneumothorax. Mild cardiomegaly. Atherosclerotic calcification of the aortic arch. There is severe gaseous distention of the stomach which may be related to gastroparesis or represent gastric outlet obstruction caused by pancreatic mass seen on the prior CT. Further evaluation with CT of the abdomen pelvis recommended. There is air in the colon. No definite free air identified. Bubbly lucencies noted over the stomach may be mixed with gastric content, however gastric pneumatosis is not excluded. Clinical correlation is recommended. There is paucity of small bowel air. There is degenerative changes of the spine. No acute osseous pathology. IMPRESSION: 1. No acute cardiopulmonary process. 2. Severe gaseous  distention of the stomach may represent gastroparesis or gastric outlet obstruction caused by pancreatic mass. Further evaluation with CT of the abdomen pelvis recommended. 3. Bubbly lucencies over the stomach may represent air mixed with gastric content or pneumatosis. No definite free air identified. Electronically Signed   By: Anner Crete M.D.   On: 11/29/2017 18:37   Dg Abd Portable 1 View  Result Date: 11/30/2017 CLINICAL DATA:  One 84 year old male with distention of the stomach and a degree of gastric outlet obstruction. Status post NG tube placement. EXAM: PORTABLE ABDOMEN - 1 VIEW COMPARISON:  Abdominal CT dated 11/29/2017. FINDINGS: There has been interval placement of an enteric tube with tip and side-port  over the stomach. There is continuous distention of the stomach. No free air identified. No acute cardiopulmonary process. IMPRESSION: Interval placement of an enteric tube with tip and side-port over the stomach. Electronically Signed   By: Anner Crete M.D.   On: 11/30/2017 00:23     Medications:  Scheduled: . enoxaparin (LOVENOX) injection  30 mg Subcutaneous Q24H  . levothyroxine  25 mcg Intravenous Daily  . pantoprazole (PROTONIX) IV  40 mg Intravenous Q12H   Continuous:  VEH:[id number], morphine injection, ondansetron (ZOFRAN) IV  Assessment/Plan:  Active Problems:   Gastric outlet obstruction    Gastric outlet obstruction secondary to mechanical compression by pancreatic mass Seen by general surgery.  Not thought to be of good candidate for surgical intervention.  We have consulted gastroenterology to consider stent placement. NG tube has been placed.  Pain appears to be reasonably well controlled.  Will consult palliative medicine as well.  Lactic acidosis Etiology unclear but could be due to nausea vomiting and dehydration.  Lactic acid levels have improved significantly.  Continue hydration.  History of hypothyroidism Currently receiving levothyroxine intravenously.  History of GERD Questionable reports of coffee-ground emesis.  Hemoglobin remains stable.  Continue IV PPI.  Stop his Lovenox.  DVT Prophylaxis: SCDs     Code Status: Full code Family Communication: Family at bedside Disposition Plan: Discussed with gastroenterology who will review his CT scan.  Will consult palliative medicine.    LOS: 1 day   [facility] Pager [phone] 11/30/2017, 10:39 AM  If 7PM-7AM, please contact night-coverage at [email or web address], password [facility]

## 2017-11-30 NOTE — ED Notes (Signed)
Date and time results received: 11/30/17 2:47 AM  (use smartphrase ".now" to insert current time)  Test: Lactic Acid Critical Value: 2.2  Name of Provider Notified: Tylene Fantasia PA via text page  Orders Received? Or Actions Taken?: awaiting response. Also notified that pt likely needs to be upgraded to tele bed

## 2017-11-30 NOTE — ED Notes (Signed)
Help get patient cleaned up patient is now resting with call bell in reach

## 2017-11-30 NOTE — Consult Note (Addendum)
[facility] Consult: 1:19 PM 11/30/2017  LOS: 1 day    Referring Provider: Curly Rim  Primary Care Physician:  Dione Housekeeper, MD Primary Gastroenterologist:  Dr. Carlean Purl  Family contact: dtr Kathrine Cords  Cell [phone].     Reason for Consultation:  Gastric outlet obstruction   HPI: Mario May is a 84 y.o. male.  A fib, not on AC.  GERD.  HTN.  Hypothyroidism.  Emphysema.  Chronic constipation, abdominal pain.  Diastasis Recti.  Previous, remote flex sigs, last in 2003 by Dr Annabell Sabal .  BEs normal in 1977, 1988.  12/2016 CT renal stone study: 3.8 x 4.3 cm irregular soft tissue mass in region of pancreatic head/ uncinate process  Pancreatic mass at inferior aspect of uncinate on MRI of 01/2017 performed for acute on chronic abdominal pain.   CT of 10/05/17 with ill-defined soft tissue mass arising from the inferior uncinate process of the pancreas as seen on the prior CT (no mention of pancreatic mass on CT of  and MRI. Mild GB distention. Mild gastric distention without obstruction.  No acute intra-abdominal or pelvic pathology.   Was never referred to GI for eval of mass  Seen 10/28/17 by Dr Carlean Purl for bloating, constipation, not for mass.  Sub optimal response to Mag citrate, Miralax, Senokot.  Lost his appetite; weight 170 on 08/21/17 >> 155 on 10/28/17.  In office pt had mental status and physical changes suggestive of CVA so visit aborted and EMS was immediately called.  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  Discharge to SNF on ASA 325 daily, no AC.  Returned to home ~ 2 weeks ago,.     Pt admitted yesterday.  Ongoing anorexia, poor po intake, marked increase abdominal distention with a severe abdominal discomfort/pain. Had been "spitting up".  After arrival  to ED had first episode of true emesis, and it was CG looking.    CT ab/pelvis: stable mass in pancreatic uncinate.  The stomach is significantly distended with food, fluid in the distal esophagus...  Mild narrowing  in the second portion of the duodenum in region of the  pancreatic mass.  Local invasion cannot be excluded. This is likely the etiology of the significantly distended stomach.  Abdominal films show GOO, gastric distention.  Bubbly lucencies over the stomach may represent air mixed with gastric content or pneumatosis. No definite free air identified NGT placed and daughter notes drastic improvement in abdominal distention and patient's pain. Nearly 1 liter output within a few hours.   Output currently looks feculent.      Family History: Family History of Colon Cancer:Mother  Family History of Prostate Cancer:Father Family History of Diabetes: Multiple family members  Family History of Colon Polyps: Brothers x 2  Family History of Heart Disease: Brother and Father    Past Medical History:  Diagnosis Date  . Anemia   . Arthritis    "just my legs" (04/06/2013)  . Assistance needed for mobility    walks with walker  .  Atrial fibrillation ([place])   . Constipation   . Gout   . HOH (hard of hearing)   . Hyperlipidemia   . Hypertension   . Hypothyroidism   . Kidney stones    "only once" (04/06/2013)  . Lumbar foraminal stenosis 06/04/2016  . Pancreatic mass   . 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 Rheumatism   . SIRS (systemic inflammatory response syndrome) ([place]) 02/2013   [name] 03/19/2013  (04/06/2013)  . Small bowel obstruction [facility])     Past Surgical History:  Procedure Laterality Date  . CYSTOSCOPY W/ STONE MANIPULATION  ?1986  . NASAL FRACTURE SURGERY    . TRANSURETHRAL RESECTION OF PROSTATE      Prior to Admission medications   Medication Sig Start Date End Date Taking? Authorizing Provider  acetaminophen (TYLENOL) 650 MG CR tablet Take 650 mg by  mouth every 8 (eight) hours as needed for pain.   Yes [provider]  allopurinol (ZYLOPRIM) 100 MG tablet Take 100 mg by mouth daily.   Yes [provider]  aspirin 325 MG tablet Take 325 mg by mouth daily.   Yes [provider]  dicyclomine (BENTYL) 20 MG tablet Take 1 tablet by mouth every 6 (six) hours as needed.   Yes [provider]  finasteride (PROSCAR) 5 MG tablet Take 5 mg by mouth daily.    Yes [provider]  furosemide (LASIX) 20 MG tablet Take 10 mg by mouth daily.  05/19/16  Yes [provider]  isosorbide dinitrate (ISORDIL) 10 MG tablet Take 10 mg by mouth 2 (two) times daily.   Yes [provider]  levothyroxine (SYNTHROID, LEVOTHROID) 75 MCG tablet Take 75 mcg by mouth daily before breakfast.   Yes [provider]  meclizine (ANTIVERT) 25 MG tablet Take 25 mg by mouth 3 (three) times daily as needed (inner ear problems).   Yes [provider]  metoCLOPramide (REGLAN) 5 MG tablet Take 1 tablet by mouth 2 (two) times daily. 11/25/17  Yes [provider]  ondansetron (ZOFRAN) 4 MG tablet Take 1 tablet (4 mg total) by mouth every 6 (six) hours as needed. Patient taking differently: Take 4 mg by mouth every 6 (six) hours as needed for nausea or vomiting.  9/67/89  Yes Delora Fuel, MD  pantoprazole (PROTONIX) 40 MG tablet Take 1 tablet (40 mg total) by mouth daily. Patient taking differently: Take 40 mg by mouth every evening.  3/81/01  Yes Delora Fuel, MD  polyethylene glycol [facility] / Floria Raveling) packet Take 17 g by mouth daily as needed for mild constipation.    Yes [provider]  Simethicone (GAS-X PO) Take 1 tablet by mouth daily as needed (flatulence).    Yes [provider]  levothyroxine (SYNTHROID, LEVOTHROID) 25 MCG tablet Take 1 tablet by mouth daily. 11/29/17   [provider]  pravastatin (PRAVACHOL) 40 MG tablet Take 1 tablet (40 mg total) by mouth daily at  6 PM. Patient not taking: Reported on 11/29/2017 10/31/17   Mary Sella, NP    Scheduled Meds: . levothyroxine  25 mcg Intravenous Daily  . pantoprazole (PROTONIX) IV  40 mg Intravenous Q12H   Infusions: . sodium chloride     PRN Meds: acetaminophen, morphine injection, ondansetron (ZOFRAN) IV   Allergies as of 11/29/2017 - Review Complete 11/29/2017  Allergen Reaction Noted  . Codeine Other (See Comments)   . Grapefruit concentrate Other (See Comments) 07/01/2013  . Other Other (See Comments) 07/01/2013  .  Pineapple Other (See Comments) 07/01/2013    Family History  Problem Relation Age of Onset  . Colon cancer Mother   . Prostate cancer Father   . Heart disease Father   . Colon polyps Brother        x 2  . Heart disease Brother     Social History   Socioeconomic History  . Marital status: Married    Spouse name: Not on file  . Number of children: 2  . Years of education: Not on file  . Highest education level: Not on file  Social Needs  . Financial resource strain: Not on file  . Food insecurity - worry: Not on file  . Food insecurity - inability: Not on file  . Transportation needs - medical: Not on file  . Transportation needs - non-medical: Not on file  Occupational History  . Not on file  Tobacco Use  . Smoking status: Former Smoker    Packs/day: 0.50    Types: Cigarettes    Last attempt to quit: 1960    Years since quitting: 59.2  . Smokeless tobacco: Never Used  Substance and Sexual Activity  . Alcohol use: No    Comment: 04/06/2013 "last time I've had any alcohol was 09/12/1945; never had a problem w/it"  . Drug use: No  . Sexual activity: Not on file  Other Topics Concern  . Not on file  Social History Narrative   Patient is married he has 2 children he is retired   No alcohol tobacco or drug use    REVIEW OF SYSTEMS: Constitutional: Weak. ENT:  No nose bleeds Pulm: No cough or shortness of breath. CV:  No palpitations, no LE edema.   No chest pain GU:  No hematuria, no frequency GI:  Per HPI Heme: No unusual bleeding.  Develops purpura easily but no acute significant bruising. Transfusions: None Neuro: Mental status had actually been pretty good up until he got morphine yesterday in the emergency department which resulted in drowsiness and some confusion which lingers today.  Sequela from his stroke last month is negligible.  His only symptom was really altered speech.  He does not have any unilateral limb weakness.  His speech had returned to normal. Derm:  No itching, no rash or sores.  Endocrine:  No sweats or chills.  No polyuria or dysuria Immunization: Did not inquire as to recent vaccinations. Travel:  None beyond local counties in last few months.    PHYSICAL EXAM: Vital signs in last 24 hours: Vitals:   11/30/17 1200 11/30/17 1304  BP: (!) 148/68 (!) 145/67  Pulse: (!) 51 (!) 34  Resp: 15 16  Temp:  98.2 F (36.8 C)  SpO2: 97% 99%   Wt Readings from Last 3 Encounters:  11/29/17 150 lb (68 kg)  10/28/17 156 lb 12 oz (71.1 kg)  10/28/17 155 lb (70.3 kg)    General: Patient does not look 84 years old.  He looks better than some 47 year olds. Head: No facial asymmetry or swelling. Eyes: No scleral icterus.  No conjunctival pallor. Ears: Hard of hearing. Nose: No discharge or congestion.  NG tube in place with a muddy greenish/gray/brown effluent.  Output does not look obviously bloody or melenic. Mouth: Tongue midline.  Oral mucosa pink, moist, clear. Neck: No JVD, no masses, no thyromegaly. Lungs: Lungs are clear though the breath sounds are bit diminished on the left side.  No cough.  No dyspnea. Heart: RRR.  No MRG.  S1, S2  present Abdomen: Soft.  Not distended.  Not tender..   Rectal: Deferred rectal exam. Musc/Skeltl: No joint swelling, erythema.  Arthritic deformities in the hands. Extremities: No CCE. Neurologic: Sleepy, awakens readily.  Not oriented to time.  Oriented to himself, his  family, place.  Follows commands.  Moves all 4 limbs.  No tremors.  Limb strength was not tested. Skin: Good coloring.  No pallor.  No jaundice. Tattoos: None Nodes: No cervical adenopathy. Psych: Pleasant, cooperative, calm.  Intake/Output from previous day: 03/12 0701 - 03/13 0700 In: 2250 [IV Piggyback:2250] Out: 925 [Urine:325; Emesis/NG output:600] Intake/Output this shift: No intake/output data recorded.  LAB RESULTS: Recent Labs    11/29/17 1800 11/30/17 0500  WBC 7.7 10.5  HGB 12.5* 12.1*  HCT 38.8* 36.3*  PLT 237 202   BMET Lab Results  Component Value Date   NA 135 11/30/2017   NA 136 11/29/2017   NA 141 10/31/2017   K 4.0 11/30/2017   K 3.5 11/29/2017   K 4.0 10/31/2017   CL 103 11/30/2017   CL 98 (L) 11/29/2017   CL 111 10/31/2017   CO2 21 (L) 11/30/2017   CO2 19 (L) 11/29/2017   CO2 20 (L) 10/31/2017   GLUCOSE 97 11/30/2017   GLUCOSE 125 (H) 11/29/2017   GLUCOSE 99 10/31/2017   BUN 17 11/30/2017   BUN 16 11/29/2017   BUN 16 10/31/2017   CREATININE 1.08 11/30/2017   CREATININE 1.18 11/29/2017   CREATININE 0.90 10/31/2017   CALCIUM 8.9 11/30/2017   CALCIUM 9.4 11/29/2017   CALCIUM 8.7 (L) 10/31/2017   LFT Recent Labs    11/29/17 1800 11/30/17 0500  PROT 7.2 6.4*  ALBUMIN 3.6 3.1*  AST 41 35  ALT 11* 10*  ALKPHOS 155* 132*  BILITOT 0.9 1.1   PT/INR Lab Results  Component Value Date   INR 1.07 11/30/2017   INR 1.07 10/28/2017   Hepatitis Panel No results for input(s): HEPBSAG, HCVAB, HEPAIGM, HEPBIGM in the last 72 hours. C-Diff No components found for: CDIFF Lipase     Component Value Date/Time   LIPASE 32 11/29/2017 1800    Drugs of Abuse  No results found for: LABOPIA, COCAINSCRNUR, LABBENZ, AMPHETMU, THCU, LABBARB   RADIOLOGY STUDIES: Ct Abdomen Pelvis W Contrast  Result Date: 11/29/2017 CLINICAL DATA:  Left upper quadrant pain EXAM: CT ABDOMEN AND PELVIS WITH CONTRAST TECHNIQUE: Multidetector CT imaging of the abdomen  and pelvis was performed using the standard protocol following bolus administration of intravenous contrast. CONTRAST:  158mL ISOVUE-300 IOPAMIDOL (ISOVUE-300) INJECTION 61% COMPARISON:  10/05/2017 FINDINGS: Lower chest: Mild dependent atelectatic changes are noted bilaterally. No focal infiltrate is seen. Hepatobiliary: Gallbladder is well distended although no inflammatory changes are seen. The liver is unremarkable. Pancreas: Body and tail of pancreas are within normal limits. Hypoattenuating mass lesion is again seen in the region of the uncinate process stable from the prior study. Spleen: Normal in size without focal abnormality. Adrenals/Urinary Tract: The adrenal glands are within normal limits. Renal cystic change is noted stable from the prior exam. No obstructive changes are seen. The bladder is well distended. Stomach/Bowel: The stomach is significantly distended with food stuffs with evidence of fluid within the distal esophagus likely related to reflux. Some mild narrowing is noted in the second portion of the duodenum in the region of the known pancreatic mass. Some local invasion cannot be totally excluded. This is likely the etiology of the significantly distended stomach. Vascular/Lymphatic: Aortic atherosclerosis. No enlarged abdominal or pelvic  lymph nodes. Reproductive: Prostate is unremarkable. Other: No abdominal wall hernia or abnormality. No abdominopelvic ascites. Musculoskeletal: Degenerative changes of the lumbar spine are noted. No acute bony abnormality is seen. IMPRESSION: Significantly distended stomach likely related to some narrowing in the second portion of the duodenum. This may be related to the patient's known pancreatic lesion. 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 Chronic changes as described above. Electronically Signed   By: Inez Catalina M.D.   On: 11/29/2017 21:08   Dg Abdomen Acute W/chest  Result Date: 11/29/2017 CLINICAL DATA:  One 84  year old male with abdominal pain. EXAM: DG ABDOMEN ACUTE W/ 1V CHEST COMPARISON:  Abdominal CT dated 10/05/2017 FINDINGS: Left lung base atelectatic changes. The right lung is clear. No large pleural effusion. There is no pneumothorax. Mild cardiomegaly. Atherosclerotic calcification of the aortic arch. There is severe gaseous distention of the stomach which may be related to gastroparesis or represent gastric outlet obstruction caused by pancreatic mass seen on the prior CT. Further evaluation with CT of the abdomen pelvis recommended. There is air in the colon. No definite free air identified. Bubbly lucencies noted over the stomach may be mixed with gastric content, however gastric pneumatosis is not excluded. Clinical correlation is recommended. There is paucity of small bowel air. There is degenerative changes of the spine. No acute osseous pathology. IMPRESSION: 1. No acute cardiopulmonary process. 2. Severe gaseous distention of the stomach may represent gastroparesis or gastric outlet obstruction caused by pancreatic mass. Further evaluation with CT of the abdomen pelvis recommended. 3. Bubbly lucencies over the stomach may represent air mixed with gastric content or pneumatosis. No definite free air identified. Electronically Signed   By: Anner Crete M.D.   On: 11/29/2017 18:37   Dg Abd Portable 1 View  Result Date: 11/30/2017 CLINICAL DATA:  One 84 year old male with distention of the stomach and a degree of gastric outlet obstruction. Status post NG tube placement. EXAM: PORTABLE ABDOMEN - 1 VIEW COMPARISON:  Abdominal CT dated 11/29/2017. FINDINGS: There has been interval placement of an enteric tube with tip and side-port over the stomach. There is continuous distention of the stomach. No free air identified. No acute cardiopulmonary process. IMPRESSION: Interval placement of an enteric tube with tip and side-port over the stomach. Electronically Signed   By: Anner Crete M.D.   On:  11/30/2017 00:23     IMPRESSION:   *  Pancreatic mass dating to 01/2017, has never been worked up.  Now looks to be causing GOO.  Sxs and exam better post NGT.  Dr Donne Hazel states "I think either ugi or gi consult with endoscopy would be first choice.  If he indeed has gastric outlet obstruction options would possibly include stenting, gastrostomy or surgery. He is a candidate for surgery but I dont believe this will likely be the best option for him..   *   Palliative GOC is pending.     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  On full dose ASA chronically.      PLAN:     *   Dr. Henrene Pastor reviewed the case and desires upper GI series in order to evaluate the duodenal stenosis.  I ordered this for tomorrow as there are several cases pending in fluoro for this afternoon/evening.  Continue BID IV Protonix.      Azucena Freed  11/30/2017, 1:19 PM Pager: [phone]  GI ATTENDING  History, laboratories, x-rays reviewed. Patient personally seen  and examined. Daughter and wife in room. Agree with comprehensive consultation note as outlined above. The patient has symptomatic gastric outlet obstruction at the duodenal level. Etiology not defined but possible causes include mass of the pancreas, or benign disease such as chronic ulcer disease. Has been seen by surgery. I agree that the next step would be upper GI series to better define the anatomy. Endoscopy thereafter with possible palliative stenting if appropriate and amenable. The patient would be high-risk given his advanced age. I have discussed this with the daughter. In the interim, continue NG suction and IV fluids for adequate hydration  Keyion Knack N. Geri Seminole., M.D. [facility] Division of Gastroenterology

## 2017-12-01 ENCOUNTER — Inpatient Hospital Stay (HOSPITAL_COMMUNITY): Payer: Medicare Other

## 2017-12-01 LAB — COMPREHENSIVE METABOLIC PANEL
ALT: 8 U/L — AB (ref 17–63)
ANION GAP: 9 (ref 5–15)
AST: 24 U/L (ref 15–41)
Albumin: 2.6 g/dL — ABNORMAL LOW (ref 3.5–5.0)
Alkaline Phosphatase: 110 U/L (ref 38–126)
BUN: 16 mg/dL (ref 6–20)
CO2: 21 mmol/L — AB (ref 22–32)
CREATININE: 0.94 mg/dL (ref 0.61–1.24)
Calcium: 8.4 mg/dL — ABNORMAL LOW (ref 8.9–10.3)
Chloride: 105 mmol/L (ref 101–111)
Glucose, Bld: 86 mg/dL (ref 65–99)
Potassium: 3.6 mmol/L (ref 3.5–5.1)
SODIUM: 135 mmol/L (ref 135–145)
Total Bilirubin: 1.6 mg/dL — ABNORMAL HIGH (ref 0.3–1.2)
Total Protein: 5.5 g/dL — ABNORMAL LOW (ref 6.5–8.1)

## 2017-12-01 LAB — CBC
HCT: 31.9 % — ABNORMAL LOW (ref 39.0–52.0)
Hemoglobin: 10.4 g/dL — ABNORMAL LOW (ref 13.0–17.0)
MCH: 32 pg (ref 26.0–34.0)
MCHC: 32.6 g/dL (ref 30.0–36.0)
MCV: 98.2 fL (ref 78.0–100.0)
PLATELETS: 178 10*3/uL (ref 150–400)
RBC: 3.25 MIL/uL — AB (ref 4.22–5.81)
RDW: 15 % (ref 11.5–15.5)
WBC: 7.9 10*3/uL (ref 4.0–10.5)

## 2017-12-01 MED ORDER — PHENOL 1.4 % MT LIQD
1.0000 | OROMUCOSAL | Status: DC | PRN
  Administered 2017-12-06: 1 via OROMUCOSAL
  Filled 2017-12-01: qty 177

## 2017-12-01 NOTE — Progress Notes (Signed)
[facility] PROGRESS NOTE  Mario May NLG:[id number] DOB: May 15, 1915 DOA: 11/29/2017  PCP: Dione Housekeeper, MD  Brief History/Interval Summary: 84 year old Caucasian male with a past medical history significant for known pancreatic mass, prior history of small bowel obstruction, atrial fibrillation not on systemic anticoagulation, hypertension hypothyroidism presented with abdominal pain distention nausea and vomiting.  He has been unable to tolerate solid foods over the last several weeks.  Patient has lost weight.  Evaluation revealed significant gastric distention.  Narrowing of the second portion of duodenum.  General surgery was consulted.  They do not feel that the patient is a candidate for surgical intervention.  Reason for Visit: Gastric outlet obstruction due to pancreatic mass  Consultants: General surgery.  Gastroenterology will be consulted.  Procedures: None yet  Antibiotics: None  Subjective/Interval History: No acute complaint.  No acute events overnight.  Still has significant output from the NG tube.  ROS: Denies any chest pain, abdominal pain, nausea  Objective:  Vital Signs  Vitals:   11/30/17 1304 11/30/17 2147 12/01/17 0632 12/01/17 1336  BP: (!) 145/67 (!) 134/53 133/72 (!) 141/71  Pulse: (!) 34 (!) 51 (!) 58 (!) 50  Resp: 16 16 16 16   Temp: 98.2 F (36.8 C) 98.7 F (37.1 C) 98.6 F (37 C) 98.8 F (37.1 C)  TempSrc: Oral Oral Oral Oral  SpO2: 99% 98% 98% 98%  Weight:      Height:        Intake/Output Summary (Last 24 hours) at 12/01/2017 1601 Last data filed at 12/01/2017 1337 Gross per 24 hour  Intake 1470 ml  Output 1750 ml  Net -280 ml   Filed Weights   11/29/17 1643  Weight: 68 kg (150 lb)    General appearance: alert, cooperative, appears stated age and no distress Head: Normocephalic, without obvious abnormality, atraumatic Resp: clear to auscultation bilaterally Cardio: regular rate and rhythm, S1, S2 normal, no  murmur, click, rub or gallop GI: Abdomen is soft.  Mildly tender in the epigastric area without any rebound rigidity or guarding.  No obvious masses organomegaly appreciated.  Bowel sounds sluggish. Extremities: extremities normal, atraumatic, no cyanosis or edema Pulses: 2+ and symmetric Neurologic: Grossly normal  Lab Results:  Data Reviewed: I have personally reviewed following labs and imaging studies  CBC: Recent Labs  Lab 11/29/17 1800 11/30/17 0500 12/01/17 0612  WBC 7.7 10.5 7.9  NEUTROABS 6.6  --   --   HGB 12.5* 12.1* 10.4*  HCT 38.8* 36.3* 31.9*  MCV 98.7 98.4 98.2  PLT 237 202 081    Basic Metabolic Panel: Recent Labs  Lab 11/29/17 1800 11/30/17 0007 11/30/17 0500 12/01/17 0612  NA 136  --  135 135  K 3.5  --  4.0 3.6  CL 98*  --  103 105  CO2 19*  --  21* 21*  GLUCOSE 125*  --  97 86  BUN 16  --  17 16  CREATININE 1.18  --  1.08 0.94  CALCIUM 9.4  --  8.9 8.4*  MG  --  1.9  --   --   PHOS  --  3.5  --   --     GFR: Estimated Creatinine Clearance: 40.2 mL/min (by C-G formula based on SCr of 0.94 mg/dL).  Liver Function Tests: Recent Labs  Lab 11/29/17 1800 11/30/17 0500 12/01/17 0612  AST 41 35 24  ALT 11* 10* 8*  ALKPHOS 155* 132* 110  BILITOT 0.9 1.1 1.6*  PROT 7.2  6.4* 5.5*  ALBUMIN 3.6 3.1* 2.6*    Recent Labs  Lab 11/29/17 1800  LIPASE 32   No results for input(s): AMMONIA in the last 168 hours.  Coagulation Profile: Recent Labs  Lab 11/30/17 0007  INR 1.07    Cardiac Enzymes: No results for input(s): CKTOTAL, CKMB, CKMBINDEX, TROPONINI in the last 168 hours.  BNP (last 3 results) No results for input(s): PROBNP in the last 8760 hours.  HbA1C: No results for input(s): HGBA1C in the last 72 hours.  CBG: Recent Labs  Lab 11/30/17 0824  GLUCAP 96    Lipid Profile: No results for input(s): CHOL, HDL, LDLCALC, TRIG, CHOLHDL, LDLDIRECT in the last 72 hours.  Thyroid Function Tests: Recent Labs    11/30/17 0008    TSH 4.243    Anemia Panel: No results for input(s): VITAMINB12, FOLATE, FERRITIN, TIBC, IRON, RETICCTPCT in the last 72 hours.  No results found for this or any previous visit (from the past 240 hour(s)).    Radiology Studies: Ct Abdomen Pelvis W Contrast  Result Date: 11/29/2017 CLINICAL DATA:  Left upper quadrant pain EXAM: CT ABDOMEN AND PELVIS WITH CONTRAST TECHNIQUE: Multidetector CT imaging of the abdomen and pelvis was performed using the standard protocol following bolus administration of intravenous contrast. CONTRAST:  12mL ISOVUE-300 IOPAMIDOL (ISOVUE-300) INJECTION 61% COMPARISON:  10/05/2017 FINDINGS: Lower chest: Mild dependent atelectatic changes are noted bilaterally. No focal infiltrate is seen. Hepatobiliary: Gallbladder is well distended although no inflammatory changes are seen. The liver is unremarkable. Pancreas: Body and tail of pancreas are within normal limits. Hypoattenuating mass lesion is again seen in the region of the uncinate process stable from the prior study. Spleen: Normal in size without focal abnormality. Adrenals/Urinary Tract: The adrenal glands are within normal limits. Renal cystic change is noted stable from the prior exam. No obstructive changes are seen. The bladder is well distended. Stomach/Bowel: The stomach is significantly distended with food stuffs with evidence of fluid within the distal esophagus likely related to reflux. Some mild narrowing is noted in the second portion of the duodenum in the region of the known pancreatic mass. Some local invasion cannot be totally excluded. This is likely the etiology of the significantly distended stomach. Vascular/Lymphatic: Aortic atherosclerosis. No enlarged abdominal or pelvic lymph nodes. Reproductive: Prostate is unremarkable. Other: No abdominal wall hernia or abnormality. No abdominopelvic ascites. Musculoskeletal: Degenerative changes of the lumbar spine are noted. No acute bony abnormality is seen.  IMPRESSION: Significantly distended stomach likely related to some narrowing in the second portion of the duodenum. This may be related to the patient's known pancreatic lesion. 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 Chronic changes as described above. Electronically Signed   By: Inez Catalina M.D.   On: 11/29/2017 21:08   Dg Abdomen Acute W/chest  Result Date: 11/29/2017 CLINICAL DATA:  One 84 year old male with abdominal pain. EXAM: DG ABDOMEN ACUTE W/ 1V CHEST COMPARISON:  Abdominal CT dated 10/05/2017 FINDINGS: Left lung base atelectatic changes. The right lung is clear. No large pleural effusion. There is no pneumothorax. Mild cardiomegaly. Atherosclerotic calcification of the aortic arch. There is severe gaseous distention of the stomach which may be related to gastroparesis or represent gastric outlet obstruction caused by pancreatic mass seen on the prior CT. Further evaluation with CT of the abdomen pelvis recommended. There is air in the colon. No definite free air identified. Bubbly lucencies noted over the stomach may be mixed with gastric content, however gastric pneumatosis is  not excluded. Clinical correlation is recommended. There is paucity of small bowel air. There is degenerative changes of the spine. No acute osseous pathology. IMPRESSION: 1. No acute cardiopulmonary process. 2. Severe gaseous distention of the stomach may represent gastroparesis or gastric outlet obstruction caused by pancreatic mass. Further evaluation with CT of the abdomen pelvis recommended. 3. Bubbly lucencies over the stomach may represent air mixed with gastric content or pneumatosis. No definite free air identified. Electronically Signed   By: Anner Crete M.D.   On: 11/29/2017 18:37   Dg Abd Portable 1 View  Result Date: 11/30/2017 CLINICAL DATA:  One 84 year old male with distention of the stomach and a degree of gastric outlet obstruction. Status post NG tube placement.  EXAM: PORTABLE ABDOMEN - 1 VIEW COMPARISON:  Abdominal CT dated 11/29/2017. FINDINGS: There has been interval placement of an enteric tube with tip and side-port over the stomach. There is continuous distention of the stomach. No free air identified. No acute cardiopulmonary process. IMPRESSION: Interval placement of an enteric tube with tip and side-port over the stomach. Electronically Signed   By: Anner Crete M.D.   On: 11/30/2017 00:23   Dg Duanne Limerick  W/kub  Result Date: 12/01/2017 CLINICAL DATA:  Gastric outlet obstruction EXAM: UPPER GI SERIES WITH KUB TECHNIQUE: After obtaining a scout radiograph a routine upper GI series was performed using thin barium FLUOROSCOPY TIME:  Fluoroscopy Time:  2 minutes Radiation Exposure Index (if provided by the fluoroscopic device): Number of Acquired Spot Images: 0 COMPARISON:  CT 11/29/2017 FINDINGS: Scout film demonstrates a NG tube coiled in the fundus of the stomach. There is gaseous distention of bowel diffusely. Thin barium was injected through the NG tube into the stomach. This demonstrates a distended stomach with a large amount of retained food material/debris. Only a trickle of contrast passed through the pylorus after 10-15 minutes on the table. Spontaneous reflux was noted into the upper thoracic esophagus during the study. The patient was sent to his room and KUB is or obtained for 2-3 hours. Final KUB demonstrates continued large volume contrast and debris within the stomach with distention of the stomach. Very small amount of contrast has passed into the small bowel. IMPRESSION: Distention of the stomach with large amount of retained food material in the stomach. Only a very small amount of contrast passes through the pylorus in 2-3 hours. Findings compatible with high grade gastric outlet obstruction. Spontaneous gastroesophageal reflux. Electronically Signed   By: Rolm Baptise M.D.   On: 12/01/2017 11:15     Medications:  Scheduled: . levothyroxine   25 mcg Intravenous Daily  . pantoprazole (PROTONIX) IV  40 mg Intravenous Q12H   Continuous: . sodium chloride 75 mL/hr at 12/01/17 0108   BEM:[id number], morphine injection, ondansetron (ZOFRAN) IV, phenol  Assessment/Plan:  Active Problems:   Gastric outlet obstruction    Gastric outlet obstruction secondary to mechanical compression by pancreatic mass Seen by general surgery.  Not thought to be of good candidate for surgical intervention.  We have consulted gastroenterology to consider stent placement. NG tube has been placed.  Pain appears to be reasonably well controlled.  Will consult palliative medicine as well.  Lactic acidosis Etiology unclear but could be due to nausea vomiting and dehydration.  Lactic acid levels have improved significantly.  Continue hydration.  History of hypothyroidism Currently receiving levothyroxine intravenously.  History of GERD Questionable reports of coffee-ground emesis.  Hemoglobin remains stable.  Continue IV PPI.  Stop his Lovenox.  DVT Prophylaxis: SCDs     Code Status: Full code Family Communication: Family at bedside Disposition Plan: Continue nasogastric suction and perform endoscopic stent placement once able.    LOS: 2 days   Berle Mull 12/01/2017, 4:01 PM  If 7PM-7AM, please contact night-coverage at [email or web address], password [facility]

## 2017-12-01 NOTE — Progress Notes (Addendum)
Mario May Progress Note    Chief Complaint:   Abdominal distention, vomiting  SUBJECTIVE:   No complaints. I awoke him from sleep   ASSESSMENT AND PLAN:   84 yo male with GOO (narrowing in D2, most likely secondary to known pancreatic mass).  -UGI series has been completed, awaiting result. Depending on results he may need duodenal stent placement. This was discussed with wife and daughter yesterday. Patient is at high risk for procedure given advanced age.     OBJECTIVE:     Vital signs in last 24 hours: Temp:  [98.2 F (36.8 C)-98.7 F (37.1 C)] 98.6 F (37 C) (03/14 9562) Pulse Rate:  [34-58] 58 (03/14 0632) Resp:  [15-16] 16 (03/14 1308) BP: (133-154)/(53-74) 133/72 (03/14 6578) SpO2:  [97 %-100 %] 98 % (03/14 4696) Last BM Date: 11/28/17 General:   Alert, well-developed white male  in NAD EENT:  Hard of hearing, non icteric sclera, conjunctive pink. NGT in place, capped off Heart:  Regular rate and rhythm, no lower extremity edema Pulm: Normal respiratory effor. Abdomen:  Soft, nondistended, nontender.  Normal bowel sounds, no masses felt. No hepatomegaly.    Neurologic:  Alert and  oriented ;  grossly normal neurologically. Psych:  Pleasant, cooperative.  Normal mood and affect.   Intake/Output from previous day: 03/13 0701 - 03/14 0700 In: 1245 [I.V.:1245] Out: 1150 [Urine:600; Emesis/NG output:550] Intake/Output this shift: No intake/output data recorded.  Lab Results: Recent Labs    11/29/17 1800 11/30/17 0500 12/01/17 0612  WBC 7.7 10.5 7.9  HGB 12.5* 12.1* 10.4*  HCT 38.8* 36.3* 31.9*  PLT 237 202 178   BMET Recent Labs    11/29/17 1800 11/30/17 0500 12/01/17 0612  NA 136 135 135  K 3.5 4.0 3.6  CL 98* 103 105  CO2 19* 21* 21*  GLUCOSE 125* 97 86  BUN 16 17 16   CREATININE 1.18 1.08 0.94  CALCIUM 9.4 8.9 8.4*   LFT Recent Labs    12/01/17 0612  PROT 5.5*  ALBUMIN 2.6*  AST 24  ALT 8*  ALKPHOS 110    BILITOT 1.6*   PT/INR Recent Labs    11/30/17 0007  LABPROT 13.8  INR 1.07     Ct Abdomen Pelvis W Contrast  Result Date: 11/29/2017 CLINICAL DATA:  Left upper quadrant pain EXAM: CT ABDOMEN AND PELVIS WITH CONTRAST TECHNIQUE: Multidetector CT imaging of the abdomen and pelvis was performed using the standard protocol following bolus administration of intravenous contrast. CONTRAST:  144mL ISOVUE-300 IOPAMIDOL (ISOVUE-300) INJECTION 61% COMPARISON:  10/05/2017 FINDINGS: Lower chest: Mild dependent atelectatic changes are noted bilaterally. No focal infiltrate is seen. Hepatobiliary: Gallbladder is well distended although no inflammatory changes are seen. The liver is unremarkable. Pancreas: Body and tail of pancreas are within normal limits. Hypoattenuating mass lesion is again seen in the region of the uncinate process stable from the prior study. Spleen: Normal in size without focal abnormality. Adrenals/Urinary Tract: The adrenal glands are within normal limits. Renal cystic change is noted stable from the prior exam. No obstructive changes are seen. The bladder is well distended. Stomach/Bowel: The stomach is significantly distended with food stuffs with evidence of fluid within the distal esophagus likely related to reflux. Some mild narrowing is noted in the second portion of the duodenum in the region of the known pancreatic mass. Some local invasion cannot be totally excluded. This is likely the etiology of the significantly distended stomach. Vascular/Lymphatic: Aortic atherosclerosis. No enlarged abdominal or  pelvic lymph nodes. Reproductive: Prostate is unremarkable. Other: No abdominal wall hernia or abnormality. No abdominopelvic ascites. Musculoskeletal: Degenerative changes of the lumbar spine are noted. No acute bony abnormality is seen. IMPRESSION: Significantly distended stomach likely related to some narrowing in the second portion of the duodenum. This may be related to the  patient's known pancreatic lesion. 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 Chronic changes as described above. Electronically Signed   By: Mario Catalina M.D.   On: 11/29/2017 21:08   Dg Abdomen Acute W/chest  Result Date: 11/29/2017 CLINICAL DATA:  One 84 year old male with abdominal pain. EXAM: DG ABDOMEN ACUTE W/ 1V CHEST COMPARISON:  Abdominal CT dated 10/05/2017 FINDINGS: Left lung base atelectatic changes. The right lung is clear. No large pleural effusion. There is no pneumothorax. Mild cardiomegaly. Atherosclerotic calcification of the aortic arch. There is severe gaseous distention of the stomach which may be related to gastroparesis or represent gastric outlet obstruction caused by pancreatic mass seen on the prior CT. Further evaluation with CT of the abdomen pelvis recommended. There is air in the colon. No definite free air identified. Bubbly lucencies noted over the stomach may be mixed with gastric content, however gastric pneumatosis is not excluded. Clinical correlation is recommended. There is paucity of small bowel air. There is degenerative changes of the spine. No acute osseous pathology. IMPRESSION: 1. No acute cardiopulmonary process. 2. Severe gaseous distention of the stomach may represent gastroparesis or gastric outlet obstruction caused by pancreatic mass. Further evaluation with CT of the abdomen pelvis recommended. 3. Bubbly lucencies over the stomach may represent air mixed with gastric content or pneumatosis. No definite free air identified. Electronically Signed   By: Mario Crete M.D.   On: 11/29/2017 18:37   Dg Abd Portable 1 View  Result Date: 11/30/2017 CLINICAL DATA:  One 84 year old male with distention of the stomach and a degree of gastric outlet obstruction. Status post NG tube placement. EXAM: PORTABLE ABDOMEN - 1 VIEW COMPARISON:  Abdominal CT dated 11/29/2017. FINDINGS: There has been interval placement of an enteric tube with  tip and side-port over the stomach. There is continuous distention of the stomach. No free air identified. No acute cardiopulmonary process. IMPRESSION: Interval placement of an enteric tube with tip and side-port over the stomach. Electronically Signed   By: Mario Crete M.D.   On: 11/30/2017 00:23    Active Problems:   Gastric outlet obstruction    LOS: 2 days   Mario May ,NP 12/01/2017, 9:35 AM  Pager number [phone]  GI ATTENDING  Interval history data reviewed. Patient seen and examined. Upper GI series reviewed. Patient has a dilated stomach with lots of food retained still. Obstruction at the level of the pylorus. Recommend ongoing NG decompression to try to remove is much food, contrast, and debris as possible. When this is achieved, I recommend upper endoscopy to assess his obstruction directly and see what if any options may exist to help based on the etiology. Will follow.  Mario Chuck. Geri Seminole., M.D. [facility] Division of [facility]

## 2017-12-01 NOTE — Progress Notes (Signed)
Initial Nutrition Assessment  DOCUMENTATION CODES:   Not applicable  INTERVENTION:   -RD will follow for potential diet advancement and adjust plan when goals of care are established  NUTRITION DIAGNOSIS:   Inadequate oral intake related to altered GI function as evidenced by NPO status.  GOAL:   Patient will meet greater than or equal to 90% of their needs   MONITOR:   Diet advancement, Labs, Weight trends, Skin, I & O's  REASON FOR ASSESSMENT:   Malnutrition Screening Tool, Consult Assessment of nutrition requirement/status  ASSESSMENT:   Mario May is a 84 y.o. male with medical history significant for known pancreatic mass, prior history of small bowel obstruction, A. fib not on systemic anticoagulation, hypertension, hypothyroidism and GERD who presents to the ED from home with left-sided abdominal pain, worsening abdominal distention and nausea with vomiting.  3/12- NGT placed and connected to low-intermittent suction  NGT: 550 ml x 24 hours  Pt receiving nursing care at times of visits. Unable to obtain further hx or complete nutrition-focused physical exam.   Noted pt has experienced a 12.9% wt loss over the past 3 months, which is significant for time frame.   Palliative care and GI consults pending. Pt is a high risk surgical candidate.   Labs reviewed.  Diet Order:  No diet orders on file  EDUCATION NEEDS:   Not appropriate for education at this time  Skin:  Skin Assessment: Reviewed RN Assessment  Last BM:  11/28/17  Height:   Ht Readings from Last 1 Encounters:  11/29/17 5\' 11"  (1.803 m)    Weight:   Wt Readings from Last 1 Encounters:  12/02/17 148 lb 5.9 oz (67.3 kg)    Ideal Body Weight:  75.5 kg  BMI:  Body mass index is 20.69 kg/m.  Estimated Nutritional Needs:   Kcal:  1500-1700  Protein:  75-90 grams  Fluid:  > 1.5 L    Gifford Ballon A. Jimmye Norman, RD, LDN, CDE Pager: [phone] After hours Pager: [phone]

## 2017-12-01 NOTE — Consult Note (Signed)
Consultation Note Date: 12/01/2017   Patient Name: Mario May  DOB: March 07, 1915  MRN: [id number]  Age / Sex: 84 y.o., male  PCP: Mario Housekeeper, MD Referring Physician: Lavina Hamman, MD  Reason for Consultation: Establishing goals of care  HPI/Patient Profile: 83 y.o. male  admitted on 11/29/2017 with abdominal pain, distention, nausea, and vomiting. He has a past medical history significant for  known pancreatic mass, prior history of small bowel obstruction, atrial fibrillation not on systemic anticoagulation, hypertension, GERD, and hypothyroidism.  Patient has been unable to tolerate solid intake over the last few weeks, and it worsened to the point at which he was unable to keep down any solids or liquids.  He notes multiple episodes of dark brown emesis prior to presentation to the ED.  Patient's known pancreatic mass at the uncinate process was first identified in April 2018. The mass is being followed expectantly.  CT A/P was obtained in the ED demonstrating significant gastric distention, likely related to narrowing within the second portion of the duodenum. Patient is receiving hydration and a NG tube was placed on second attempt. General surgery was consulted in the ED; they feel the patient is not a surgical candidate at this time. Palliative Medicine has been consulted for goals of care.   Clinical Assessment and Goals of Care: I have reviewed medical records including MAR, lab results, imaging results, received report from bedside RN. I assessed the patient and then met at the bedside along with Mario May (daughter/HCPOA) and wife Mario May  to discuss diagnosis prognosis, Mario May, Mario May wishes, disposition and options.  I introduced Palliative Medicine as specialized medical care for people living with serious illness. It focuses on providing relief from the symptoms and stress of a serious  illness. The goal is to improve quality of life for both the patient and the family.  We discussed a brief life review of the patient. Family reports Mario May retired from the [employer], were he was employed for over 65 years. He has been married to his wife for over 38 years and they have a 1 daughter and a son, along with 4 grandchildren. He loved spending time with his family, being active in church, and gardening.   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 He began to show signs of decline over the past few months. Family reports noticing changes in his appetite and interest for things. He also began to be weaker.   We discussed their current illness and what it means in the larger context of their on-going co-morbidities.  Natural disease trajectory and expectations at Mario May were discussed.  I attempted to elicit values and goals of care important to the patient.    The difference between aggressive medical intervention and comfort care was considered in light of the patient's goals of care. Daughter was tearful during conversation. She verbalized understanding and noticing the  signs that her father is tired and ready to transition. She reports the hardest part isn't allowing this to happen but how her mother will respond. Mario May is at the bedside and she verbalized not wanting to be involved in the conversation. She sat at the window and looked out tearful. She is also hard of hearing. Daughter verbalized they have discussed this some amongst themselves however, she felt that there was a little bit of hope based on some reports from providers. She verbalized that family knows that even with intervention it would only be a temporary fix and that they are even considering not going through with aggressive measures knowing further risk or decline is involved.  Daughter verbalizes wanting to discuss situation again this evening amongst her family (brother and mother) and make sure they are all in mutual agreement with shifting focus to comfort versus aggressive care.   Hospice and Palliative Care services outpatient were explained and offered. She verbalized they would most likely opt to take him home with hospice services versus a residential facility to promote comfort and being in a familiar environment.   Advanced directives, concepts specific to code status, artifical feeding and hydration, and rehospitalization were considered and discussed. At this time daughter verbalizes not wanting to proceed with DNR/DNI until she was able to have a follow up conversation amongst family.   Questions and concerns were addressed.  The family was encouraged to call with questions or concerns.  PMT will continue to support holistically.  HCPOA- Mario May (Daughter/HCPOA) and Mario May (Spouse)    SUMMARY OF RECOMMENDATIONS    FULL CODE-family will discuss amongst themselves this afternoon and provide medical team with decision later today or tomorrow  Continue to treat the treatable per attending as family requesting time to discuss amongst themselves a decision to shift to full comfort care and in home hospice.   Palliative medicine team will follow up with family tomorrow morning and continue to provide support to patient, family, and medical team during hospitalization.   Code Status/Advance Care Planning: Full code -daughter expressed that she is onboard with transitioning to DNR/DNI however, would like to speak with her brother and mother prior to making a decision. She verbalized she would notify team with finalized decision if not today then first thing tomorrow.    Palliative Prophylaxis:   Aspiration, Bowel Regimen, Delirium Protocol, Frequent Pain Assessment, Oral Care and Turn Reposition  Additional Recommendations (Limitations, Scope,  Preferences):  Full Scope Treatment-continue to treat the treatable until family is able to discuss shifting focus to comfort measures amongst themselves. Will await family's decision.   Psycho-social/Spiritual:   Desire for further Chaplaincy support:No  Additional Recommendations: Caregiving  Support/Resources and Education on Hospice  Prognosis:   Unable to determine-Gurarded to poor in the setting of SBO (not a surgical candidate), known pancreatic mass, malnutrition, poor po intake, immobility.   Discharge Planning: To Be Determined -family having discussion amongst themselves     Primary Diagnoses: Present on Admission: . Gastric outlet obstruction   I have reviewed the medical record, interviewed the patient and family, and examined the patient. The following aspects are pertinent.  Past Medical History:  Diagnosis Date  . Anemia   . Arthritis    "just my legs" (04/06/2013)  . Assistance needed for mobility    walks with walker  . Atrial fibrillation ([place])   . Constipation   . Gout   . HOH (hard of hearing)   . Hyperlipidemia   .  Hypertension   . Hypothyroidism   . Kidney stones    "only once" (04/06/2013)  . Lumbar foraminal stenosis 06/04/2016  . Pancreatic mass   . 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 Rheumatism   . SIRS (systemic inflammatory response syndrome) ([place]) 02/2013   [name] 03/19/2013  (04/06/2013)  . Small bowel obstruction ([facility])    Social History   Socioeconomic History  . Marital status: Married    Spouse name: None  . Number of children: 2  . Years of education: None  . Highest education level: None  Social Needs  . Financial resource strain: None  . Food insecurity - worry: None  . Food insecurity - inability: None  . Transportation needs - medical: None  . Transportation needs - non-medical: None  Occupational History  . None  Tobacco Use  . Smoking status: Former Smoker    Packs/day: 0.50    Types: Cigarettes      Last attempt to quit: 1960    Years since quitting: 59.2  . Smokeless tobacco: Never Used  Substance and Sexual Activity  . Alcohol use: No    Comment: 04/06/2013 "last time I've had any alcohol was 09/12/1945; never had a problem w/it"  . Drug use: No  . Sexual activity: None  Other Topics Concern  . None  Social History Narrative   Patient is married he has 2 children he is retired   No alcohol tobacco or drug use   Family History  Problem Relation Age of Onset  . Colon cancer Mother   . Prostate cancer Father   . Heart disease Father   . Colon polyps Brother        x 2  . Heart disease Brother    Scheduled Meds: . levothyroxine  25 mcg Intravenous Daily  . pantoprazole (PROTONIX) IV  40 mg Intravenous Q12H   Continuous Infusions: . sodium chloride 75 mL/hr at 12/01/17 0108   PRN Meds:.acetaminophen, morphine injection, ondansetron (ZOFRAN) IV, phenol Medications Prior to Admission:  Prior to Admission medications   Medication Sig Start Date End Date Taking? Authorizing Provider  acetaminophen (TYLENOL) 650 MG CR tablet Take 650 mg by mouth every 8 (eight) hours as needed for pain.   Yes [provider]  allopurinol (ZYLOPRIM) 100 MG tablet Take 100 mg by mouth daily.   Yes [provider]  aspirin 325 MG tablet Take 325 mg by mouth daily.   Yes [provider]  dicyclomine (BENTYL) 20 MG tablet Take 1 tablet by mouth every 6 (six) hours as needed.   Yes [provider]  finasteride (PROSCAR) 5 MG tablet Take 5 mg by mouth daily.    Yes [provider]  furosemide (LASIX) 20 MG tablet Take 10 mg by mouth daily.  05/19/16  Yes [provider]  isosorbide dinitrate (ISORDIL) 10 MG tablet Take 10 mg by mouth 2 (two) times daily.   Yes [provider]  levothyroxine (SYNTHROID, LEVOTHROID) 75 MCG tablet Take 75 mcg by mouth daily before breakfast.   Yes [provider]  meclizine (ANTIVERT) 25 MG tablet  Take 25 mg by mouth 3 (three) times daily as needed (inner ear problems).   Yes [provider]  metoCLOPramide (REGLAN) 5 MG tablet Take 1 tablet by mouth 2 (two) times daily. 11/25/17  Yes [provider]  ondansetron (ZOFRAN) 4 MG tablet Take 1 tablet (4 mg total) by mouth every 6 (six) hours as needed. Patient taking differently: Take 4  mg by mouth every 6 (six) hours as needed for nausea or vomiting.  02/24/76  Yes Delora Fuel, MD  pantoprazole (PROTONIX) 40 MG tablet Take 1 tablet (40 mg total) by mouth daily. Patient taking differently: Take 40 mg by mouth every evening.  0/34/03  Yes Delora Fuel, MD  polyethylene glycol [facility] Floria Raveling) packet Take 17 g by mouth daily as needed for mild constipation.    Yes [provider]  Simethicone (GAS-X PO) Take 1 tablet by mouth daily as needed (flatulence).    Yes [provider]  levothyroxine (SYNTHROID, LEVOTHROID) 25 MCG tablet Take 1 tablet by mouth daily. 11/29/17   [provider]  pravastatin (PRAVACHOL) 40 MG tablet Take 1 tablet (40 mg total) by mouth daily at 6 PM. Patient not taking: Reported on 11/29/2017 10/31/17   Keyarah Mcroy Sella, NP   Allergies  Allergen Reactions  . Codeine Other (See Comments)    Passes out  . Grapefruit Concentrate Other (See Comments)    unknown  . Other Other (See Comments)    Potatoes, green beans, corn - unknown  . Pineapple Other (See Comments)    unknown   Review of Systems  Unable to perform ROS: Acuity of condition    Physical Exam  Constitutional: Vital signs are normal. He is sleeping and cooperative. He appears ill.  Cardiovascular: Normal rate, regular rhythm and intact distal pulses.  Pulmonary/Chest: Effort normal. He has decreased breath sounds.  Abdominal: Soft. Normal appearance. Bowel sounds are decreased.  Musculoskeletal:  Generalized weakness   Neurological: He is alert.    Vital Signs: BP (!) 141/71 (BP Location: Left Arm)    Pulse (!) 50   Temp 98.8 F (37.1 C) (Oral)   Resp 16   Ht 5' 11"  (1.803 m)   Wt 68 kg (150 lb)   SpO2 98%   BMI 20.92 kg/m  Pain Assessment: 0-10   Pain Score: Asleep   SpO2: SpO2: 98 % O2 Device:SpO2: 98 % O2 Flow Rate: .   IO: Intake/output summary:   Intake/Output Summary (Last 24 hours) at 12/01/2017 1637 Last data filed at 12/01/2017 1337 Gross per 24 hour  Intake 1470 ml  Output 1750 ml  Net -280 ml    LBM: Last BM Date: 11/28/17 Baseline Weight: Weight: 68 kg (150 lb) Most recent weight: Weight: 68 kg (150 lb)     Palliative Assessment/Data:PPS 30%     Time In: 1400 Time Out: 1515 Time Total: 75 min   Greater than 50%  of this time was spent counseling and coordinating care related to the above assessment and plan.  Signed by:  Alda Lea, NP-BC Palliative Medicine Team  Phone: [phone] Fax: [phone]  Discussed and Agree with above/co-sign        Wadie Lessen NP   Please contact Palliative Medicine Team phone at [phone] for questions and concerns.  For individual provider: See Shea Evans

## 2017-12-01 NOTE — Progress Notes (Signed)
[facility] Progress Note     Subjective: CC: jaw pain Patient denies abdominal pain, nausea. No flatus. Reports some jaw pain that started this AM.  Objective: Vital signs in last 24 hours: Temp:  [98.2 F (36.8 C)-98.7 F (37.1 C)] 98.6 F (37 C) (03/14 7858) Pulse Rate:  [34-58] 58 (03/14 0632) Resp:  [15-16] 16 (03/14 0632) BP: (133-154)/(53-74) 133/72 (03/14 0632) SpO2:  [97 %-100 %] 98 % (03/14 8502) Last BM Date: 11/28/17  Intake/Output from previous day: 03/13 0701 - 03/14 0700 In: 1245 [I.V.:1245] Out: 1150 [Urine:600; Emesis/NG output:550] Intake/Output this shift: No intake/output data recorded.  PE: Gen:  Alert, NAD, pleasant Card:  Regular rate and rhythm, pedal pulses 2+ BL Pulm:  Normal effort, clear to auscultation bilaterally Abd: Soft, mildly tender, non-distended, bowel sounds present, no HSM Skin: warm and dry, no rashes  Psych: A&Ox3   Lab Results:  Recent Labs    11/30/17 0500 12/01/17 0612  WBC 10.5 7.9  HGB 12.1* 10.4*  HCT 36.3* 31.9*  PLT 202 178   BMET Recent Labs    11/30/17 0500 12/01/17 0612  NA 135 135  K 4.0 3.6  CL 103 105  CO2 21* 21*  GLUCOSE 97 86  BUN 17 16  CREATININE 1.08 0.94  CALCIUM 8.9 8.4*   PT/INR Recent Labs    11/30/17 0007  LABPROT 13.8  INR 1.07   CMP     Component Value Date/Time   NA 135 12/01/2017 0612   K 3.6 12/01/2017 0612   CL 105 12/01/2017 0612   CO2 21 (L) 12/01/2017 0612   GLUCOSE 86 12/01/2017 0612   BUN 16 12/01/2017 0612   CREATININE 0.94 12/01/2017 0612   CALCIUM 8.4 (L) 12/01/2017 0612   PROT 5.5 (L) 12/01/2017 0612   ALBUMIN 2.6 (L) 12/01/2017 0612   AST 24 12/01/2017 0612   ALT 8 (L) 12/01/2017 0612   ALKPHOS 110 12/01/2017 0612   BILITOT 1.6 (H) 12/01/2017 0612   GFRNONAA >60 12/01/2017 0612   GFRAA >60 12/01/2017 0612   Lipase     Component Value Date/Time   LIPASE 32 11/29/2017 1800       Studies/Results: Ct Abdomen Pelvis W  Contrast  Result Date: 11/29/2017 CLINICAL DATA:  Left upper quadrant pain EXAM: CT ABDOMEN AND PELVIS WITH CONTRAST TECHNIQUE: Multidetector CT imaging of the abdomen and pelvis was performed using the standard protocol following bolus administration of intravenous contrast. CONTRAST:  140mL ISOVUE-300 IOPAMIDOL (ISOVUE-300) INJECTION 61% COMPARISON:  10/05/2017 FINDINGS: Lower chest: Mild dependent atelectatic changes are noted bilaterally. No focal infiltrate is seen. Hepatobiliary: Gallbladder is well distended although no inflammatory changes are seen. The liver is unremarkable. Pancreas: Body and tail of pancreas are within normal limits. Hypoattenuating mass lesion is again seen in the region of the uncinate process stable from the prior study. Spleen: Normal in size without focal abnormality. Adrenals/Urinary Tract: The adrenal glands are within normal limits. Renal cystic change is noted stable from the prior exam. No obstructive changes are seen. The bladder is well distended. Stomach/Bowel: The stomach is significantly distended with food stuffs with evidence of fluid within the distal esophagus likely related to reflux. Some mild narrowing is noted in the second portion of the duodenum in the region of the known pancreatic mass. Some local invasion cannot be totally excluded. This is likely the etiology of the significantly distended stomach. Vascular/Lymphatic: Aortic atherosclerosis. No enlarged abdominal or pelvic lymph nodes. Reproductive: Prostate is unremarkable. Other: No  abdominal wall hernia or abnormality. No abdominopelvic ascites. Musculoskeletal: Degenerative changes of the lumbar spine are noted. No acute bony abnormality is seen. IMPRESSION: Significantly distended stomach likely related to some narrowing in the second portion of the duodenum. This may be related to the patient's known pancreatic lesion. 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 Chronic changes as  described above. Electronically Signed   By: Inez Catalina M.D.   On: 11/29/2017 21:08   Dg Abdomen Acute W/chest  Result Date: 11/29/2017 CLINICAL DATA:  One 84 year old male with abdominal pain. EXAM: DG ABDOMEN ACUTE W/ 1V CHEST COMPARISON:  Abdominal CT dated 10/05/2017 FINDINGS: Left lung base atelectatic changes. The right lung is clear. No large pleural effusion. There is no pneumothorax. Mild cardiomegaly. Atherosclerotic calcification of the aortic arch. There is severe gaseous distention of the stomach which may be related to gastroparesis or represent gastric outlet obstruction caused by pancreatic mass seen on the prior CT. Further evaluation with CT of the abdomen pelvis recommended. There is air in the colon. No definite free air identified. Bubbly lucencies noted over the stomach may be mixed with gastric content, however gastric pneumatosis is not excluded. Clinical correlation is recommended. There is paucity of small bowel air. There is degenerative changes of the spine. No acute osseous pathology. IMPRESSION: 1. No acute cardiopulmonary process. 2. Severe gaseous distention of the stomach may represent gastroparesis or gastric outlet obstruction caused by pancreatic mass. Further evaluation with CT of the abdomen pelvis recommended. 3. Bubbly lucencies over the stomach may represent air mixed with gastric content or pneumatosis. No definite free air identified. Electronically Signed   By: Anner Crete M.D.   On: 11/29/2017 18:37   Dg Abd Portable 1 View  Result Date: 11/30/2017 CLINICAL DATA:  One 84 year old male with distention of the stomach and a degree of gastric outlet obstruction. Status post NG tube placement. EXAM: PORTABLE ABDOMEN - 1 VIEW COMPARISON:  Abdominal CT dated 11/29/2017. FINDINGS: There has been interval placement of an enteric tube with tip and side-port over the stomach. There is continuous distention of the stomach. No free air identified. No acute  cardiopulmonary process. IMPRESSION: Interval placement of an enteric tube with tip and side-port over the stomach. Electronically Signed   By: Anner Crete M.D.   On: 11/30/2017 00:23    Anti-infectives: Anti-infectives (From admission, onward)   None       Assessment/Plan Hx of CVA Atrial Fibrillation HTN Hypothyroidism  Gastric outlet obstruction Pancreatic mass - dating to 01/2017 - GI on board and recommending UGI series, possibly followed by endoscopy - NGT with 550 cc out in 24h - palliative consult to establish GOC pending - no indications for acute surgical intervention - we will continue to follow   FEN: NPO, IVF; NGT to LIWS VTE: SCDs ID: no abx indicated  LOS: 2 days    Brigid Re , [facility] 12/01/2017, 9:17 AM Pager: [phone] Consults: [phone] Mon-Fri 7:00 am-4:30 pm Sat-Sun 7:00 am-11:30 am

## 2017-12-02 ENCOUNTER — Inpatient Hospital Stay (HOSPITAL_COMMUNITY): Payer: Medicare Other

## 2017-12-02 DIAGNOSIS — K8689 Other specified diseases of pancreas: Secondary | ICD-10-CM

## 2017-12-02 DIAGNOSIS — Z7189 Other specified counseling: Secondary | ICD-10-CM

## 2017-12-02 DIAGNOSIS — Z515 Encounter for palliative care: Secondary | ICD-10-CM

## 2017-12-02 LAB — BASIC METABOLIC PANEL
Anion gap: 12 (ref 5–15)
BUN: 14 mg/dL (ref 6–20)
CALCIUM: 8.3 mg/dL — AB (ref 8.9–10.3)
CO2: 18 mmol/L — ABNORMAL LOW (ref 22–32)
Chloride: 107 mmol/L (ref 101–111)
Creatinine, Ser: 0.92 mg/dL (ref 0.61–1.24)
GFR calc Af Amer: >60 mL/min (ref >60)
GLUCOSE: 73 mg/dL (ref 65–99)
Potassium: 3.4 mmol/L — ABNORMAL LOW (ref 3.5–5.1)
Sodium: 137 mmol/L (ref 135–145)

## 2017-12-02 LAB — GLUCOSE, CAPILLARY
Glucose-Capillary: 69 mg/dL (ref 65–99)
Glucose-Capillary: 98 mg/dL (ref 65–99)

## 2017-12-02 MED ORDER — DEXTROSE 50 % IV SOLN
INTRAVENOUS | Status: AC
  Administered 2017-12-02: 25 mL via INTRAVENOUS
  Filled 2017-12-02: qty 50

## 2017-12-02 MED ORDER — KCL IN DEXTROSE-NACL 20-5-0.45 MEQ/L-%-% IV SOLN
INTRAVENOUS | Status: DC
  Administered 2017-12-02 – 2017-12-03 (×3): via INTRAVENOUS
  Filled 2017-12-02 (×3): qty 1000

## 2017-12-02 MED ORDER — BENZOCAINE 10 % MT GEL
Freq: Four times a day (QID) | OROMUCOSAL | Status: DC | PRN
  Filled 2017-12-02: qty 9

## 2017-12-02 MED ORDER — LORAZEPAM 2 MG/ML IJ SOLN
0.5000 mg | INTRAMUSCULAR | Status: DC | PRN
  Administered 2017-12-03 – 2017-12-05 (×2): 0.5 mg via INTRAVENOUS
  Filled 2017-12-02 (×2): qty 1

## 2017-12-02 MED ORDER — DEXTROSE 50 % IV SOLN
25.0000 mL | Freq: Once | INTRAVENOUS | Status: AC
  Administered 2017-12-02: 25 mL via INTRAVENOUS

## 2017-12-02 MED ORDER — GLYCOPYRROLATE 0.2 MG/ML IJ SOLN
0.2000 mg | Freq: Four times a day (QID) | INTRAMUSCULAR | Status: DC | PRN
  Administered 2017-12-04 – 2017-12-06 (×3): 0.2 mg via INTRAVENOUS
  Filled 2017-12-02 (×3): qty 1

## 2017-12-02 MED ORDER — MORPHINE SULFATE (PF) 4 MG/ML IV SOLN
2.0000 mg | INTRAVENOUS | Status: DC | PRN
  Administered 2017-12-02 – 2017-12-03 (×4): 2 mg via INTRAVENOUS
  Filled 2017-12-02 (×4): qty 1

## 2017-12-02 NOTE — Progress Notes (Signed)
Patient ID: Mario May, male   DOB: 08/08/1915, 84 y.o.   MRN: [id number]   Have noted UGI results Awaiting GI plan re: stent as well as Palliative Care  Call us if needed.  Imogene Burn. Georgette Dover, MD, [facility]  General/ Trauma Surgery  12/02/2017 8:38 AM

## 2017-12-02 NOTE — Progress Notes (Signed)
Hypoglycemic Event  CBG: 69  Treatment: D50 IV 25 mL  Symptoms: None  Follow-up CBG: Time:09 CBG Result:98  Possible Reasons for Event: Inadequate meal intake  Comments/MD notified: Dr Posey Pronto notified.    Mario May Karin Golden

## 2017-12-02 NOTE — Progress Notes (Signed)
1300 NGT not draining, pt's abd is distended but soft.  Checked for patency/placement by xray. 1500 pt pulled out the NGT accidentally.  1600 New NGT inserted, FR 18, mod amount of brownish gastric content noted. Placement confirmed by Xray. Connected to LIS.

## 2017-12-02 NOTE — Progress Notes (Addendum)
[facility] Progress Note   Chief Complaint:   Gastric outlet obstruction  SUBJECTIVE:    "not much better". Wife and daughter visiting.    ASSESSMENT AND PLAN:   84 yo male with high grade GOO. A lot of debris in stomach on UGI series yesterday. Very little contrast passed through pylorus.  -Plan is for EGD with intent to place a stent once stomach decompressed. For now continue NGT decompression. He isn't having much NGT output. Per daughter the nurses have irrigated and repositioned NGT but still with minimal output.  -Hospice is now following patient. Family still wants to try to get stent placed when we feel comfortable trying.      OBJECTIVE:     Vital signs in last 24 hours: Temp:  [98.2 F (36.8 C)-98.8 F (37.1 C)] 98.2 F (36.8 C) (03/15 0420) Pulse Rate:  [50-54] 50 (03/15 0420) Resp:  [16-18] 18 (03/15 0420) BP: (141-162)/(63-78) 159/63 (03/15 0420) SpO2:  [98 %] 98 % (03/15 0420) Weight:  [148 lb 5.9 oz (67.3 kg)] 148 lb 5.9 oz (67.3 kg) (03/15 0420) Last BM Date: 11/28/17 General:   Alert, well-developed, white male  in NAD EENT:  Normal hearing, non icteric sclera, conjunctive pink.  Heart:  Regular rate and rhythm, no lower extremity edema Pulm: Normal respiratory effort Abdomen:  Soft, nondistended, nontender.  Normal bowel sounds, no masses felt. No hepatomegaly.    Neurologic:  Alert and  oriented x4;  grossly normal neurologically. Psych:  Pleasant, cooperative.  Normal mood and affect.   Intake/Output from previous day: 03/14 0701 - 03/15 0700 In: 1756 [I.V.:1756] Out: 1275 [Urine:1125; Emesis/NG output:150] Intake/Output this shift: No intake/output data recorded.  Lab Results: Recent Labs    11/29/17 1800 11/30/17 0500 12/01/17 0612  WBC 7.7 10.5 7.9  HGB 12.5* 12.1* 10.4*  HCT 38.8* 36.3* 31.9*  PLT 237 202 178   BMET Recent Labs    11/30/17 0500 12/01/17 0612 12/02/17 0803  NA 135 135 137  K 4.0 3.6 3.4*    CL 103 105 107  CO2 21* 21* 18*  GLUCOSE 97 86 73  BUN 17 16 14   CREATININE 1.08 0.94 0.92  CALCIUM 8.9 8.4* 8.3*   LFT Recent Labs    12/01/17 0612  PROT 5.5*  ALBUMIN 2.6*  AST 24  ALT 8*  ALKPHOS 110  BILITOT 1.6*   PT/INR Recent Labs    11/30/17 0007  LABPROT 13.8  INR 1.07   Hepatitis Panel No results for input(s): HEPBSAG, HCVAB, HEPAIGM, HEPBIGM in the last 72 hours.  Dg Ugi  W/kub  Result Date: 12/01/2017 CLINICAL DATA:  Gastric outlet obstruction EXAM: UPPER GI SERIES WITH KUB TECHNIQUE: After obtaining a scout radiograph a routine upper GI series was performed using thin barium FLUOROSCOPY TIME:  Fluoroscopy Time:  2 minutes Radiation Exposure Index (if provided by the fluoroscopic device): Number of Acquired Spot Images: 0 COMPARISON:  CT 11/29/2017 FINDINGS: Scout film demonstrates a NG tube coiled in the fundus of the stomach. There is gaseous distention of bowel diffusely. Thin barium was injected through the NG tube into the stomach. This demonstrates a distended stomach with a large amount of retained food material/debris. Only a trickle of contrast passed through the pylorus after 10-15 minutes on the table. Spontaneous reflux was noted into the upper thoracic esophagus during the study. The patient was sent to his room and KUB is or obtained for 2-3 hours. Final KUB demonstrates continued large volume  contrast and debris within the stomach with distention of the stomach. Very small amount of contrast has passed into the small bowel. IMPRESSION: Distention of the stomach with large amount of retained food material in the stomach. Only a very small amount of contrast passes through the pylorus in 2-3 hours. Findings compatible with high grade gastric outlet obstruction. Spontaneous gastroesophageal reflux. Electronically Signed   By: Rolm Baptise M.D.   On: 12/01/2017 11:15    Active Problems:   Gastric outlet obstruction    LOS: 3 days   Mario May ,NP  12/02/2017, 10:58 AM  Pager number [phone]  GI ATTENDING  Interval history data reviewed. Patient seen and examined. Agree with above progress note as outlined. Plans are for upper endoscopy to assess the pathology more adequately. This will allow [place] to determine if endoscopic stenting is feasible and helpful. I would not attempt endoscopy until the stomach is sufficiently clear of debris. Continue to monitor NG output and Check KUB in a.m.  Mario May., M.D. [facility] Division of Gastroenterology

## 2017-12-02 NOTE — Progress Notes (Signed)
Nutrition Brief Note  Chart reviewed. Pt now transitioning to comfort care (per palliative notes, will transition to residential hospice placement in [facility]).  No further nutrition interventions warranted at this time.  Please re-consult as needed.   Merida Alcantar A. Jimmye Norman, RD, LDN, CDE Pager: [phone] After hours Pager: [phone]

## 2017-12-02 NOTE — Progress Notes (Signed)
[facility] PROGRESS NOTE  JACCOB CZAPLICKI WNU:[id number] DOB: [birth year] DOA: 11/29/2017  PCP: Dione Housekeeper, MD  Brief History/Interval Summary: 84 year old Caucasian male with a past medical history significant for known pancreatic mass, prior history of small bowel obstruction, atrial fibrillation not on systemic anticoagulation, hypertension hypothyroidism presented with abdominal pain distention nausea and vomiting.  He has been unable to tolerate solid foods over the last several weeks.  Patient has lost weight.  Evaluation revealed significant gastric distention.  Narrowing of the second portion of duodenum.  General surgery was consulted.  They do not feel that the patient is a candidate for surgical intervention.  Reason for Visit: Gastric outlet obstruction due to pancreatic mass  Consultants: General surgery.  Gastroenterology  Procedures: None yet  Antibiotics: None  Subjective/Interval History: Comment about pain in his right molar teeth as well as left knee.  No other acute events overnight.  During the day patient needed reinsertion of his NG tube twice.  ROS: Denies any chest pain, abdominal pain, nausea  Objective:  Vital Signs  Vitals:   12/01/17 1336 12/01/17 2005 12/02/17 0420 12/02/17 1456  BP: (!) 141/71 (!) 162/78 (!) 159/63 (!) 159/92  Pulse: (!) 50 (!) 54 (!) 50 63  Resp: 16 17 18 18   Temp: 98.8 F (37.1 C) 98.2 F (36.8 C) 98.2 F (36.8 C) 97.6 F (36.4 C)  TempSrc: Oral Oral  Oral  SpO2: 98% 98% 98% 99%  Weight:   67.3 kg (148 lb 5.9 oz)   Height:        Intake/Output Summary (Last 24 hours) at 12/02/2017 1844 Last data filed at 12/02/2017 1800 Gross per 24 hour  Intake 1287.67 ml  Output 1175 ml  Net 112.67 ml   Filed Weights   11/29/17 1643 12/02/17 0420  Weight: 68 kg (150 lb) 67.3 kg (148 lb 5.9 oz)    General appearance: alert, cooperative, appears stated age and no distress Head: Normocephalic, without obvious  abnormality, atraumatic Resp: clear to auscultation bilaterally Cardio: regular rate and rhythm, S1, S2 normal, no murmur, click, rub or gallop GI: Abdomen is soft.  Mildly tender in the epigastric area without any rebound rigidity or guarding.  No obvious masses organomegaly appreciated.  Bowel sounds sluggish. Extremities: extremities normal, atraumatic, no cyanosis or edema Pulses: 2+ and symmetric Neurologic: Grossly normal  Lab Results:  Data Reviewed: I have personally reviewed following labs and imaging studies  CBC: Recent Labs  Lab 11/29/17 1800 11/30/17 0500 12/01/17 0612  WBC 7.7 10.5 7.9  NEUTROABS 6.6  --   --   HGB 12.5* 12.1* 10.4*  HCT 38.8* 36.3* 31.9*  MCV 98.7 98.4 98.2  PLT 237 202 034    Basic Metabolic Panel: Recent Labs  Lab 11/29/17 1800 11/30/17 0007 11/30/17 0500 12/01/17 0612 12/02/17 0803  NA 136  --  135 135 137  K 3.5  --  4.0 3.6 3.4*  CL 98*  --  103 105 107  CO2 19*  --  21* 21* 18*  GLUCOSE 125*  --  97 86 73  BUN 16  --  17 16 14   CREATININE 1.18  --  1.08 0.94 0.92  CALCIUM 9.4  --  8.9 8.4* 8.3*  MG  --  1.9  --   --   --   PHOS  --  3.5  --   --   --     GFR: Estimated Creatinine Clearance: 40.6 mL/min (by C-G formula based on SCr  of 0.92 mg/dL).  Liver Function Tests: Recent Labs  Lab 11/29/17 1800 11/30/17 0500 12/01/17 0612  AST 41 35 24  ALT 11* 10* 8*  ALKPHOS 155* 132* 110  BILITOT 0.9 1.1 1.6*  PROT 7.2 6.4* 5.5*  ALBUMIN 3.6 3.1* 2.6*    Recent Labs  Lab 11/29/17 1800  LIPASE 32   No results for input(s): AMMONIA in the last 168 hours.  Coagulation Profile: Recent Labs  Lab 11/30/17 0007  INR 1.07    Cardiac Enzymes: No results for input(s): CKTOTAL, CKMB, CKMBINDEX, TROPONINI in the last 168 hours.  BNP (last 3 results) No results for input(s): PROBNP in the last 8760 hours.  HbA1C: No results for input(s): HGBA1C in the last 72 hours.  CBG: Recent Labs  Lab 11/30/17 0824  12/02/17 0829 12/02/17 0918  GLUCAP 96 69 98    Lipid Profile: No results for input(s): CHOL, HDL, LDLCALC, TRIG, CHOLHDL, LDLDIRECT in the last 72 hours.  Thyroid Function Tests: Recent Labs    11/30/17 0008  TSH 4.243    Anemia Panel: No results for input(s): VITAMINB12, FOLATE, FERRITIN, TIBC, IRON, RETICCTPCT in the last 72 hours.  No results found for this or any previous visit (from the past 240 hour(s)).    Radiology Studies: Dg Abd Portable 1v  Result Date: 12/02/2017 CLINICAL DATA:  Encounter for nasogastric tube placement. EXAM: PORTABLE ABDOMEN - 1 VIEW COMPARISON:  Radiograph of December 02, 2017. FINDINGS: Distal tip of nasogastric tube is seen in proximal stomach. Large amount of residual contrast is noted in the stomach. Mild gastric distention is noted. Air-filled colon is noted. Mild bowel dilatation is noted suggesting ileus. IMPRESSION: Distal tip of nasogastric tube is seen in proximal stomach. Probable ileus is noted. Electronically Signed   By: Marijo Conception, M.D.   On: 12/02/2017 17:06   Dg Abd Portable 1v  Result Date: 12/02/2017 CLINICAL DATA:  Nasogastric tube placement EXAM: PORTABLE ABDOMEN - 1 VIEW COMPARISON:  December 01, 2017 FINDINGS: Nasogastric tube tip and side port are in the stomach. There is contrast in the stomach and portion of the colon. There is moderate generalized bowel dilatation. No free air. There is atelectatic change in the left lung base. IMPRESSION: Nasogastric tube tip and side port in stomach. Suspect a degree of underlying ileus. No free air. Electronically Signed   By: Lowella Grip III M.D.   On: 12/02/2017 15:08   Dg Duanne Limerick  W/kub  Result Date: 12/01/2017 CLINICAL DATA:  Gastric outlet obstruction EXAM: UPPER GI SERIES WITH KUB TECHNIQUE: After obtaining a scout radiograph a routine upper GI series was performed using thin barium FLUOROSCOPY TIME:  Fluoroscopy Time:  2 minutes Radiation Exposure Index (if provided by the  fluoroscopic device): Number of Acquired Spot Images: 0 COMPARISON:  CT 11/29/2017 FINDINGS: Scout film demonstrates a NG tube coiled in the fundus of the stomach. There is gaseous distention of bowel diffusely. Thin barium was injected through the NG tube into the stomach. This demonstrates a distended stomach with a large amount of retained food material/debris. Only a trickle of contrast passed through the pylorus after 10-15 minutes on the table. Spontaneous reflux was noted into the upper thoracic esophagus during the study. The patient was sent to his room and KUB is or obtained for 2-3 hours. Final KUB demonstrates continued large volume contrast and debris within the stomach with distention of the stomach. Very small amount of contrast has passed into the small bowel. IMPRESSION: Distention of  the stomach with large amount of retained food material in the stomach. Only a very small amount of contrast passes through the pylorus in 2-3 hours. Findings compatible with high grade gastric outlet obstruction. Spontaneous gastroesophageal reflux. Electronically Signed   By: Rolm Baptise M.D.   On: 12/01/2017 11:15     Medications:  Scheduled: . levothyroxine  25 mcg Intravenous Daily  . pantoprazole (PROTONIX) IV  40 mg Intravenous Q12H   Continuous: . dextrose 5 % and 0.45 % NaCl with KCl 20 mEq/L 50 mL/hr at 12/02/17 1052   HQP:[id number], benzocaine, glycopyrrolate, LORazepam, morphine injection, ondansetron (ZOFRAN) IV, phenol  Assessment/Plan:  Active Problems:   Gastric outlet obstruction   Pancreatic mass   Palliative care by specialist   DNR (do not resuscitate) discussion    Gastric outlet obstruction secondary to mechanical compression by pancreatic mass Seen by general surgery.  Not thought to be of good candidate for surgical intervention.   Underwent upper GI series. Palliative care, GI as well as general surgery discussed with family today. Goal is for hospice with  comfort care protocol. GI will consider palliative stenting. Continue NG tube for now.  Lactic acidosis Etiology unclear but could be due to nausea vomiting and dehydration.  Lactic acid levels have improved significantly.  Continue hydration.  History of hypothyroidism Currently receiving levothyroxine intravenously.  History of GERD Questionable reports of coffee-ground emesis.  Hemoglobin remains stable.  Continue IV PPI.  Stop his Lovenox.  Throat pain. Use Orajel.  DVT Prophylaxis: SCDs     Code Status: Full code Family Communication: Family at bedside Disposition Plan: Continue nasogastric suction and perform endoscopic stent placement once able.    LOS: 3 days   Berle Mull 12/02/2017, 6:44 PM  If 7PM-7AM, please contact night-coverage at [email or web address], password [facility]

## 2017-12-02 NOTE — Progress Notes (Addendum)
Palliative Medicine RN Note: Follow up for symptoms and decisions.  Patient continues to c/o mouth pain. RN is waiting on Orajel to arrive. Patient is also requesting morphine; I updated Dr Hilma Favors, who both adjusted morphine and added Robinul.  Met with patient, daughter Jenny Reichmann, and wife. GI NP was rounding at the same time. We discussed plan of care and goals. GI feels that they can decompress Mr Desa and place a palliative stent, which would relieve a lot of suffering and allow d/c of NGT. The family, reasonably, would like to try this. Family also agrees that they do not want him to have aggressive measures if his heart were to stop, so DNR order was placed.   Plan for PMT to watch for GI progress. We will discuss appropriate hospice options once GI has been able to evaluate further. If he is unable to be scoped or have a stent placed, then he will need residential hospice placement in [place]. If the stent can be placed, he may need home hospice, but it is likely he will still need inpatient hospice based on a very short prognosis.  I flushed NGT twice with a total of 100 cc of water to ensure it is working and replaced the wall suction regulator. Charge RN Remo Lipps and [employer] are both aware.  Marjie Skiff Radford Pease, RN, BSN, [facility] Palliative Medicine Team 12/02/2017 12:04 PM Office [phone]

## 2017-12-02 NOTE — Care Management Important Message (Signed)
Important Message  Patient Details  Name: Mario May MRN: [id number] Date of Birth: 04/25/1915   Medicare Important Message Given:  Yes    [name] 12/02/2017, 11:17 AM

## 2017-12-03 ENCOUNTER — Inpatient Hospital Stay (HOSPITAL_COMMUNITY): Payer: Medicare Other

## 2017-12-03 LAB — BASIC METABOLIC PANEL
Anion gap: 6 (ref 5–15)
BUN: 5 mg/dL — ABNORMAL LOW (ref 6–20)
CALCIUM: 7.7 mg/dL — AB (ref 8.9–10.3)
CO2: 22 mmol/L (ref 22–32)
CREATININE: 0.73 mg/dL (ref 0.61–1.24)
Chloride: 103 mmol/L (ref 101–111)
GFR calc non Af Amer: >60 mL/min (ref >60)
Glucose, Bld: 346 mg/dL — ABNORMAL HIGH (ref 65–99)
Potassium: 4.8 mmol/L (ref 3.5–5.1)
SODIUM: 131 mmol/L — AB (ref 135–145)

## 2017-12-03 LAB — GLUCOSE, CAPILLARY
Glucose-Capillary: 105 mg/dL — ABNORMAL HIGH (ref 65–99)
Glucose-Capillary: 104 mg/dL — ABNORMAL HIGH (ref 65–99)

## 2017-12-03 MED ORDER — POTASSIUM CHLORIDE IN NACL 20-0.45 MEQ/L-% IV SOLN
INTRAVENOUS | Status: DC
  Administered 2017-12-04 – 2017-12-05 (×3): via INTRAVENOUS
  Filled 2017-12-03 (×6): qty 1000

## 2017-12-03 MED ORDER — HALOPERIDOL LACTATE 5 MG/ML IJ SOLN
1.0000 mg | Freq: Four times a day (QID) | INTRAMUSCULAR | Status: DC | PRN
  Administered 2017-12-03 – 2017-12-06 (×10): 2 mg via INTRAVENOUS
  Filled 2017-12-03 (×11): qty 1

## 2017-12-03 NOTE — Progress Notes (Signed)
Patient pulled his NGT , very confused, attempted to get OOB multiple times. MD made aware of patient behavior. Family notified about patient behavior.

## 2017-12-03 NOTE — Progress Notes (Signed)
[facility] PROGRESS NOTE  Mario May:[id number] DOB: 10-10-1914 DOA: 11/29/2017  PCP: Dione Housekeeper, MD  Brief History/Interval Summary: 84 year old Caucasian male with a past medical history significant for known pancreatic mass, prior history of small bowel obstruction, atrial fibrillation not on systemic anticoagulation, hypertension hypothyroidism presented with abdominal pain distention nausea and vomiting.  He has been unable to tolerate solid foods over the last several weeks.  Patient has lost weight.  Evaluation revealed significant gastric distention.  Narrowing of the second portion of duodenum.  General surgery was consulted.  They do not feel that the patient is a candidate for surgical intervention.  Reason for Visit: Gastric outlet obstruction due to pancreatic mass  Consultants: General surgery.  Gastroenterology  Procedures: None yet  Antibiotics: None  Subjective/Interval History: No acute event overnight.  This morning patient pulled out his NG tube again.  Reinserted.  Oral pain as well as knee pain better.  ROS: Denies any chest pain, abdominal pain, nausea  Objective:  Vital Signs  Vitals:   12/02/17 2035 12/03/17 0500 12/03/17 0627 12/03/17 1402  BP: (!) 151/75  (!) 158/79 (!) 179/69  Pulse: (!) 53   67  Resp: 18  18 18   Temp: 98.4 F (36.9 C)  98.7 F (37.1 C) 98.1 F (36.7 C)  TempSrc: Oral  Oral Oral  SpO2: 100%  100% 98%  Weight:  70.4 kg (155 lb 3.3 oz)    Height:        Intake/Output Summary (Last 24 hours) at 12/03/2017 1630 Last data filed at 12/03/2017 1500 Gross per 24 hour  Intake 1206.67 ml  Output 1400 ml  Net -193.33 ml   Filed Weights   11/29/17 1643 12/02/17 0420 12/03/17 0500  Weight: 68 kg (150 lb) 67.3 kg (148 lb 5.9 oz) 70.4 kg (155 lb 3.3 oz)    General appearance: alert, cooperative, appears stated age and no distress Head: Normocephalic, without obvious abnormality, atraumatic Resp: clear to  auscultation bilaterally Cardio: regular rate and rhythm, S1, S2 normal, no murmur, click, rub or gallop GI: Abdomen is soft.  Mildly tender in the epigastric area without any rebound rigidity or guarding.  No obvious masses organomegaly appreciated.  Bowel sounds sluggish. Extremities: extremities normal, atraumatic, no cyanosis or edema Pulses: 2+ and symmetric Neurologic: Grossly normal  Lab Results:  Data Reviewed: I have personally reviewed following labs and imaging studies  CBC: Recent Labs  Lab 11/29/17 1800 11/30/17 0500 12/01/17 0612  WBC 7.7 10.5 7.9  NEUTROABS 6.6  --   --   HGB 12.5* 12.1* 10.4*  HCT 38.8* 36.3* 31.9*  MCV 98.7 98.4 98.2  PLT 237 202 956    Basic Metabolic Panel: Recent Labs  Lab 11/29/17 1800 11/30/17 0007 11/30/17 0500 12/01/17 0612 12/02/17 0803 12/03/17 0740  NA 136  --  135 135 137 131*  K 3.5  --  4.0 3.6 3.4* 4.8  CL 98*  --  103 105 107 103  CO2 19*  --  21* 21* 18* 22  GLUCOSE 125*  --  97 86 73 346*  BUN 16  --  17 16 14  5*  CREATININE 1.18  --  1.08 0.94 0.92 0.73  CALCIUM 9.4  --  8.9 8.4* 8.3* 7.7*  MG  --  1.9  --   --   --   --   PHOS  --  3.5  --   --   --   --  GFR: Estimated Creatinine Clearance: 48.9 mL/min (by C-G formula based on SCr of 0.73 mg/dL).  Liver Function Tests: Recent Labs  Lab 11/29/17 1800 11/30/17 0500 12/01/17 0612  AST 41 35 24  ALT 11* 10* 8*  ALKPHOS 155* 132* 110  BILITOT 0.9 1.1 1.6*  PROT 7.2 6.4* 5.5*  ALBUMIN 3.6 3.1* 2.6*    Recent Labs  Lab 11/29/17 1800  LIPASE 32   No results for input(s): AMMONIA in the last 168 hours.  Coagulation Profile: Recent Labs  Lab 11/30/17 0007  INR 1.07    Cardiac Enzymes: No results for input(s): CKTOTAL, CKMB, CKMBINDEX, TROPONINI in the last 168 hours.  BNP (last 3 results) No results for input(s): PROBNP in the last 8760 hours.  HbA1C: No results for input(s): HGBA1C in the last 72 hours.  CBG: Recent Labs  Lab  11/30/17 0824 12/02/17 0829 12/02/17 0918 12/03/17 0624 12/03/17 0749  GLUCAP 96 69 98 105* 104*    Lipid Profile: No results for input(s): CHOL, HDL, LDLCALC, TRIG, CHOLHDL, LDLDIRECT in the last 72 hours.  Thyroid Function Tests: No results for input(s): TSH, T4TOTAL, FREET4, T3FREE, THYROIDAB in the last 72 hours.  Anemia Panel: No results for input(s): VITAMINB12, FOLATE, FERRITIN, TIBC, IRON, RETICCTPCT in the last 72 hours.  No results found for this or any previous visit (from the past 240 hour(s)).    Radiology Studies: Dg Abd 1 View  Result Date: 12/03/2017 CLINICAL DATA:  NG tube placement EXAM: ABDOMEN - 1 VIEW COMPARISON:  12/03/2017 600 hours FINDINGS: NG tube has been placed. The tip is in the fundus of the stomach. There is barium in the stomach and colon. Colon and stomach are dilated. IMPRESSION: NG tube tip is in the fundus of the stomach. Electronically Signed   By: Marybelle Killings M.D.   On: 12/03/2017 11:52   Dg Abd Portable 1v  Result Date: 12/03/2017 CLINICAL DATA:  Gastric outlet obstruction. EXAM: PORTABLE ABDOMEN - 1 VIEW COMPARISON:  Abdominal x-ray from yesterday. FINDINGS: Unchanged positioning of the nasogastric tube with the tip in the proximal stomach. Large amount of residual contrast within the stomach is grossly unchanged. Air-filled colon is similar to prior study. Borderline small bowel dilatation, also unchanged. IMPRESSION: 1. Unchanged nasogastric tube within the proximal stomach, with evidence of gastric outlet obstruction. 2. Unchanged ileus. Electronically Signed   By: Titus Dubin M.D.   On: 12/03/2017 09:00   Dg Abd Portable 1v  Result Date: 12/02/2017 CLINICAL DATA:  Encounter for nasogastric tube placement. EXAM: PORTABLE ABDOMEN - 1 VIEW COMPARISON:  Radiograph of December 02, 2017. FINDINGS: Distal tip of nasogastric tube is seen in proximal stomach. Large amount of residual contrast is noted in the stomach. Mild gastric distention is  noted. Air-filled colon is noted. Mild bowel dilatation is noted suggesting ileus. IMPRESSION: Distal tip of nasogastric tube is seen in proximal stomach. Probable ileus is noted. Electronically Signed   By: Marijo Conception, M.D.   On: 12/02/2017 17:06   Dg Abd Portable 1v  Result Date: 12/02/2017 CLINICAL DATA:  Nasogastric tube placement EXAM: PORTABLE ABDOMEN - 1 VIEW COMPARISON:  December 01, 2017 FINDINGS: Nasogastric tube tip and side port are in the stomach. There is contrast in the stomach and portion of the colon. There is moderate generalized bowel dilatation. No free air. There is atelectatic change in the left lung base. IMPRESSION: Nasogastric tube tip and side port in stomach. Suspect a degree of underlying ileus. No free air. Electronically  Signed   By: Lowella Grip III M.D.   On: 12/02/2017 15:08     Medications:  Scheduled: . levothyroxine  25 mcg Intravenous Daily  . pantoprazole (PROTONIX) IV  40 mg Intravenous Q12H   Continuous: . dextrose 5 % and 0.45 % NaCl with KCl 20 mEq/L 50 mL/hr at 12/03/17 1330   IWP:[id number], benzocaine, glycopyrrolate, LORazepam, morphine injection, ondansetron (ZOFRAN) IV, phenol  Assessment/Plan:  Active Problems:   Gastric outlet obstruction   Pancreatic mass   Palliative care by specialist   DNR (do not resuscitate) discussion    Gastric outlet obstruction secondary to mechanical compression by pancreatic mass Seen by general surgery.  Not thought to be of good candidate for surgical intervention.   Underwent upper GI series. Palliative care, GI as well as general surgery discussed with family today. Goal is for hospice with comfort care protocol. GI will consider palliative stenting. Continue NG tube for now.  After reinsertion of the NG tube today patient was placed on continuous suction followed by low intermittent suction again.  Lactic acidosis Etiology unclear but could be due to nausea vomiting and dehydration.   Lactic acid levels have improved significantly.  Continue hydration.  History of hypothyroidism Currently receiving levothyroxine intravenously.  History of GERD Questionable reports of coffee-ground emesis.  Hemoglobin remains stable.  Continue IV PPI.  Stop his Lovenox.  Throat pain. Use Orajel.  DVT Prophylaxis: SCDs     Code Status: Full code Family Communication: Family at bedside Disposition Plan: Continue nasogastric suction and perform endoscopic stent placement once able.    LOS: 4 days   Berle Mull 12/03/2017, 4:30 PM  If 7PM-7AM, please contact night-coverage at [email or web address], password [facility]

## 2017-12-03 NOTE — Progress Notes (Signed)
Patient continue to be very agitated despite ativan and morphine, MD made aware.

## 2017-12-03 NOTE — Progress Notes (Signed)
GI ATTENDING  Today's x-ray reviewed. Still much residual material in the stomach to perform endoscopy (risk of aspiration would be quite high). Continue with aggressive NG tube suctioning. We will recheck x-ray tomorrow. When stomach reasonably cleared we will perform diagnostic upper endoscopy and decide whether therapeutic stenting is an option  Shilo Philipson N. Geri Seminole., M.D. [facility] Division of Gastroenterology

## 2017-12-04 ENCOUNTER — Inpatient Hospital Stay (HOSPITAL_COMMUNITY): Payer: Medicare Other

## 2017-12-04 DIAGNOSIS — R935 Abnormal findings on diagnostic imaging of other abdominal regions, including retroperitoneum: Secondary | ICD-10-CM

## 2017-12-04 DIAGNOSIS — R451 Restlessness and agitation: Secondary | ICD-10-CM

## 2017-12-04 DIAGNOSIS — K3189 Other diseases of stomach and duodenum: Secondary | ICD-10-CM

## 2017-12-04 LAB — GLUCOSE, CAPILLARY: Glucose-Capillary: 136 mg/dL — ABNORMAL HIGH (ref 65–99)

## 2017-12-04 MED ORDER — HYDROMORPHONE HCL 1 MG/ML IJ SOLN
0.5000 mg | INTRAMUSCULAR | Status: DC | PRN
  Administered 2017-12-05: 0.5 mg via INTRAVENOUS
  Filled 2017-12-04 (×2): qty 1

## 2017-12-04 NOTE — Progress Notes (Signed)
Daily Progress Note   Patient Name: Mario May       Date: 12/04/2017 DOB: 12-May-1915  Age: 84 y.o. MRN#: [id number] Attending Physician: Lavina Hamman, MD Primary Care Physician: Dione Housekeeper, MD Admit Date: 11/29/2017  Reason for Consultation/Follow-up: Establishing goals of care, Non pain symptom management, Pain control and Psychosocial/spiritual support  Subjective: Patient was asleep in bed. Son Marden Noble) at the bedside. Reports father has been pretty agitated over the past 24hrs. Family would like morphine discontinued as they feel this is increasing his confusion, hallucinations, and agitation. Doug reports after receiving morphine he began reaching for things and yelling out. Family and staff had difficulty keeping him in bed and he pulled out NG tube. NG tube was replaced.   Son verbalized not wanting to continue to watch his father suffer. Discussed symptom management and making sure we focus on keeping him comfortable (without pain, discomfort, respiratory distress, agitation, anxiety, nausea or vomiting). Family also inquired about hospice placement after hearing what GI has to say the first of the week regarding the possibility of endoscopy with palliative stent placement.   Chart reviewed.   Length of Stay: 5  Current Medications: Scheduled Meds:  . levothyroxine  25 mcg Intravenous Daily  . pantoprazole (PROTONIX) IV  40 mg Intravenous Q12H    Continuous Infusions: . 0.45 % NaCl with KCl 20 mEq / L 50 mL/hr at 12/04/17 0328    PRN Meds: acetaminophen, benzocaine, glycopyrrolate, haloperidol lactate, LORazepam, morphine injection, ondansetron (ZOFRAN) IV, phenol  Physical Exam  Constitutional: Vital signs are normal. He is sleeping. He has a sickly appearance.    Cardiovascular: Normal rate and regular rhythm.  Abdominal:  Nondistended, NG in place, draining brown tinged contents  Musculoskeletal:  Generalized weakness   Psychiatric: Cognition and memory are impaired.  Agitation at times   Nursing note and vitals reviewed.           Vital Signs: BP 116/73   Pulse (!) 109   Temp 99.4 F (37.4 C) (Oral)   Resp 20   Ht 5\' 11"  (1.803 m)   Wt 70.4 kg (155 lb 3.3 oz)   SpO2 98%   BMI 21.65 kg/m  SpO2: SpO2: (could not get it) O2 Device: O2 Device: Room Air O2 Flow Rate:    Intake/output summary:  Intake/Output Summary (Last 24 hours) at 12/04/2017 1006 Last data filed at 12/04/2017 4944 Gross per 24 hour  Intake 1228.33 ml  Output 1090 ml  Net 138.33 ml   LBM: Last BM Date: 11/28/17 Baseline Weight: Weight: 68 kg (150 lb) Most recent weight: Weight: 70.4 kg (155 lb 3.3 oz)       Palliative Assessment/Data:PPS 20%    Flowsheet Rows     Most Recent Value  Intake Tab  Referral Department  Hospitalist  Unit at Time of Referral  Med/Surg Unit  Palliative Care Primary Diagnosis  Cancer  Date Notified  11/30/17  Palliative Care Type  Return patient Palliative Care  Reason for referral  Clarify Goals of Care  Date of Admission  11/29/17  Date first seen by Palliative Care  12/01/17  # of days Palliative referral response time  1 Day(s)  # of days IP prior to Palliative referral  1  Clinical Assessment  Psychosocial & Spiritual Assessment  Palliative Care Outcomes      Patient Active Problem List   Diagnosis Date Noted  . Pancreatic mass   . Palliative care by specialist   . DNR (do not resuscitate) discussion   . Gastric outlet obstruction 11/29/2017  . Stroke (cerebrum) ([place]) 10/28/2017  . CAP (community acquired pneumonia) 08/21/2017  . Neck pain 08/21/2017  . Bradycardia 01/23/2017  . Atrial fibrillation with slow ventricular response ([place]) 01/23/2017  . Dehydration 08/19/2016  . Elevated troponin 08/18/2016  .  AKI (acute kidney injury) ([place]) 08/18/2016  . Difficulty in walking, not elsewhere classified   . Gait instability   . Left leg cellulitis 06/05/2016  . Orthostatic hypotension 06/04/2016  . 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 Lumbar foraminal stenosis 06/04/2016  . DJD (degenerative joint disease), multiple sites 06/04/2016  . Fever 06/04/2016  . Leukocytosis 06/04/2016  . Stasis dermatitis of both legs 07/01/2013  . Essential hypertension 03/15/2013  . Hypothyroidism, adult 03/18/2009  . CONSTIPATION 03/18/2009  . CHANGE IN BOWELS 03/18/2009    Palliative Care Assessment & Plan   Patient Profile: 84 y.o. male  admitted on 11/29/2017 with abdominal pain, distention, nausea, and vomiting. He has a past medical history significant for  known pancreatic mass, prior history of small bowel obstruction, atrial fibrillation not on systemic anticoagulation, hypertension, GERD, and hypothyroidism. Patient has been unable to tolerate solid intake over the last few weeks, and it worsened to the point at which he was unable to keep down any solids or liquids. He notes multiple episodes of dark brown emesis prior to presentation to the ED. Patient's known pancreatic mass at the uncinate process was first identified in April 2018. The mass is being followed expectantly. CT A/P was obtained in the ED demonstrating significant gastric distention, likely related to narrowing within the second portion of the duodenum. Patient is receiving hydration and a NG tube was placed on second attempt. General surgery was consulted in the ED; they feel the patient is not a surgical candidate at this time. Palliative Medicine has been consulted for goals of care.   Assessment: Active Problems:   Gastric outlet obstruction   Pancreatic mass   Palliative care by specialist   DNR (do not resuscitate) discussion   History of hypothyroidism   History of GERD   Lactic acidosis     Recommendations/Plan:  DNR/DNI  Continue to treat the treatable per attending as family awaiting GI input and results of Abd x-ray with pending option to have endoscopy/palliative stent placement.  If continues to have retained gastric content and procedure is not an option soon, family is considering to shifting focus to full comfort (no further x-rays or test) and residential hospice placement for EOL care.   Will d/c morphine due to increase agitation and hallucinations per family and staff request.    Dilaudid 0.5,mg every 2hrs for pain and dyspnea  Agree with Haldol and Ativan as ordered for agitation/anxiety   Palliative medicine team will continue to provide support to patient, family, and medical team during hospitalization.   Goals of Care and Additional Recommendations:  Limitations on Scope of Treatment: Full Scope Treatment and continue to await GI input regarding potential endoscopy/palliative stent placement. If not a candidate. Family considering shifting to full comfort care with symtpom management   Code Status:    Code Status Orders  (From admission, onward)        Start     Ordered   12/02/17 1101  Do not attempt resuscitation (DNR)  Continuous    Question Answer Comment  In the event of cardiac or respiratory ARREST Do not call a "code blue"   In the event of cardiac or respiratory ARREST Do not perform Intubation, CPR, defibrillation or ACLS   In the event of cardiac or respiratory ARREST Use medication by any route, position, wound care, and other measures to relive pain and suffering. May use oxygen, suction and manual treatment of airway obstruction as needed for comfort.      12/02/17 1100    Code Status History    Date Active Date Inactive Code Status Order ID Comments User Context   11/29/2017 23:58 12/02/2017 11:00 Full Code [id number]  Bennie Pierini, MD ED   10/28/2017 12:30 11/01/2017 01:32 Full Code [id number]  Greta Doom, MD ED    08/21/2017 18:29 08/23/2017 15:58 Full Code [id number]  Karmen Bongo, MD Inpatient   01/23/2017 08:10 01/23/2017 16:41 Partial Code [id number]  Oswald Hillock, MD Inpatient   08/18/2016 23:17 08/19/2016 20:05 Full Code [id number]  Phillips Grout, MD Inpatient   06/04/2016 18:18 06/06/2016 14:46 Full Code [id number]  Rexene Alberts, MD ED   04/06/2013 18:11 04/07/2013 15:58 Full Code [id number]  Jonetta Osgood, MD Inpatient   03/15/2013 02:27 03/19/2013 15:46 Full Code [id number]  Etta Quill., DO Inpatient      Prognosis:  Guarded to poor in the setting of SBO (not a surgical candidate),  known pancreatic mass, malnutrition, poor po intake, immobility, AMS   Discharge Planning:  Hospice facility per family request.   Care plan was discussed with family and bedside RN.   Thank you for allowing the Palliative Medicine Team to assist in the care of this patient.   Total Time 35 min Prolonged Time Billed  no      Greater than 50%  of this time was spent counseling and coordinating care related to the above assessment and plan.  Alda Lea, NP-BC Ihor Dow, NP-BC Palliative Medicine Team  Phone: [phone] Fax: [phone]  Please contact Palliative Medicine Team phone at [phone] for questions and concerns.

## 2017-12-04 NOTE — Progress Notes (Signed)
HISTORY OF PRESENT ILLNESS:  Mario May is a 84 y.o. male admitted with symptomatic gastric outlet obstruction. Etiology unclear. Has pancreatic mass, though not tremendously large. Upper GI series showed obstruction at the level of the pylorus. Question ulcer disease versus cancer. We have been working to clear his stomach of significant breathing order to perform endoscopy safely. Improved NG output overnight. X-ray being did show some barium passing beyond the stomach, though slowly.he has had some confusion and sundowning for which she has been given Haldol  REVIEW OF SYSTEMS:  All non-GI ROS negative   Past Medical History:  Diagnosis Date  . Anemia   . Arthritis    "just my legs" (04/06/2013)  . Assistance needed for mobility    walks with walker  . Atrial fibrillation ([place])   . Constipation   . Gout   . HOH (hard of hearing)   . Hyperlipidemia   . Hypertension   . Hypothyroidism   . Kidney stones    "only once" (04/06/2013)  . Lumbar foraminal stenosis 06/04/2016  . Pancreatic mass   . 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 Rheumatism   . SIRS (systemic inflammatory response syndrome) ([place]) 02/2013   Mario May 03/19/2013  (04/06/2013)  . Small bowel obstruction [facility])     Past Surgical History:  Procedure Laterality Date  . CYSTOSCOPY W/ STONE MANIPULATION  ?1986  . NASAL FRACTURE SURGERY    . TRANSURETHRAL RESECTION OF PROSTATE      Social History Mario May  reports that he quit smoking about 59 years ago. His smoking use included cigarettes. He smoked 0.50 packs per day. he has never used smokeless tobacco. He reports that he does not drink alcohol or use drugs.  family history includes Colon cancer in his mother; Colon polyps in his brother; Heart disease in his brother and father; Prostate cancer in his father.  Allergies  Allergen Reactions  . Codeine Other (See Comments)    Passes out  . Grapefruit Concentrate Other (See Comments)   unknown  . Other Other (See Comments)    Potatoes, green beans, corn - unknown  . Pineapple Other (See Comments)    unknown       PHYSICAL EXAMINATION: Vital signs: BP (!) 149/83 (BP Location: Left Arm)   Pulse 86   Temp 98.2 F (36.8 C) (Axillary)   Resp (!) 21   Ht 5\' 11"  (1.803 m)   Wt 155 lb 3.3 oz (70.4 kg)   SpO2 99%   BMI 21.65 kg/m   Constitutional: resting with NG tube in place, no acute distress Psychiatric: resting Eyes: anicteric, conjunctiva pink Cardiovascular: heart regular rate and rhythm, no murmur Lungs: clear to auscultation bilaterally Abdomen: softer, nontender, nondistended, no obvious ascites, no peritoneal signs, normal bowel sounds, no organomegaly Rectal:omitted Extremities: no clubbing, cyanosis, or lower extremity edema bilaterally Skin: no lesions on visible extremities Neuro: No focal deficits.   ASSESSMENT:  #1. Gastric outlet obstruction   PLAN:   #1. Upper endoscopy in the near future. This to assist with determining the etiology and treatment options (such as pneumatic dilation or stenting) if any. Hoping that NG suction will remove enough debris from his stomach to allow adequate endoscopy. Alternatively, he could be intubated to avoid aspiration. I discussed this with the family in detail again. As well, I will discuss this with my partner Dr. Loletha May who is on service starting tomorrow.Mario May., M.D. [facility] Division of Gastroenterology

## 2017-12-04 NOTE — Progress Notes (Signed)
[facility] PROGRESS NOTE  Mario May WVP:[id number] DOB: 06/28/1915 DOA: 11/29/2017  PCP: Dione Housekeeper, MD  Brief History/Interval Summary: 84 year old Caucasian male with a past medical history significant for known pancreatic mass, prior history of small bowel obstruction, atrial fibrillation not on systemic anticoagulation, hypertension hypothyroidism presented with abdominal pain distention nausea and vomiting.  He has been unable to tolerate solid foods over the last several weeks.  Patient has lost weight.  Evaluation revealed significant gastric distention.  Narrowing of the second portion of duodenum.  General surgery was consulted.  They do not feel that the patient is a candidate for surgical intervention.  Reason for Visit: Gastric outlet obstruction due to pancreatic mass  Consultants: General surgery.  Gastroenterology  Procedures: None yet  Antibiotics: None  Subjective/Interval History: Agitated last night.  Now calm and comfortable.  Pulled out his NG yesterday again.  ROS: Denies any chest pain, abdominal pain, nausea  Objective:  Vital Signs  Vitals:   12/03/17 1402 12/03/17 2312 12/04/17 0650 12/04/17 1329  BP: (!) 179/69 (!) 149/111 116/73 (!) 149/83  Pulse: 67 (!) 108 (!) 109 86  Resp: 18  20 (!) 21  Temp: 98.1 F (36.7 C)  99.4 F (37.4 C) 98.2 F (36.8 C)  TempSrc: Oral  Oral Axillary  SpO2: 98% 98%  99%  Weight:      Height:        Intake/Output Summary (Last 24 hours) at 12/04/2017 1716 Last data filed at 12/04/2017 1330 Gross per 24 hour  Intake 718.33 ml  Output 750 ml  Net -31.67 ml   Filed Weights   11/29/17 1643 12/02/17 0420 12/03/17 0500  Weight: 68 kg (150 lb) 67.3 kg (148 lb 5.9 oz) 70.4 kg (155 lb 3.3 oz)    General appearance: alert, cooperative, appears stated age and no distress Head: Normocephalic, without obvious abnormality, atraumatic Resp: clear to auscultation bilaterally Cardio: regular rate and  rhythm, S1, S2 normal, no murmur, click, rub or gallop GI: Abdomen is soft.  Mildly tender in the epigastric area without any rebound rigidity or guarding.  No obvious masses organomegaly appreciated.  Bowel sounds sluggish. Extremities: extremities normal, atraumatic, no cyanosis or edema Pulses: 2+ and symmetric Neurologic: Grossly normal  Lab Results:  Data Reviewed: I have personally reviewed following labs and imaging studies  CBC: Recent Labs  Lab 11/29/17 1800 11/30/17 0500 12/01/17 0612  WBC 7.7 10.5 7.9  NEUTROABS 6.6  --   --   HGB 12.5* 12.1* 10.4*  HCT 38.8* 36.3* 31.9*  MCV 98.7 98.4 98.2  PLT 237 202 546    Basic Metabolic Panel: Recent Labs  Lab 11/29/17 1800 11/30/17 0007 11/30/17 0500 12/01/17 0612 12/02/17 0803 12/03/17 0740  NA 136  --  135 135 137 131*  K 3.5  --  4.0 3.6 3.4* 4.8  CL 98*  --  103 105 107 103  CO2 19*  --  21* 21* 18* 22  GLUCOSE 125*  --  97 86 73 346*  BUN 16  --  17 16 14  5*  CREATININE 1.18  --  1.08 0.94 0.92 0.73  CALCIUM 9.4  --  8.9 8.4* 8.3* 7.7*  MG  --  1.9  --   --   --   --   PHOS  --  3.5  --   --   --   --     GFR: Estimated Creatinine Clearance: 48.9 mL/min (by C-G formula based on SCr of  0.73 mg/dL).  Liver Function Tests: Recent Labs  Lab 11/29/17 1800 11/30/17 0500 12/01/17 0612  AST 41 35 24  ALT 11* 10* 8*  ALKPHOS 155* 132* 110  BILITOT 0.9 1.1 1.6*  PROT 7.2 6.4* 5.5*  ALBUMIN 3.6 3.1* 2.6*    Recent Labs  Lab 11/29/17 1800  LIPASE 32   No results for input(s): AMMONIA in the last 168 hours.  Coagulation Profile: Recent Labs  Lab 11/30/17 0007  INR 1.07    Cardiac Enzymes: No results for input(s): CKTOTAL, CKMB, CKMBINDEX, TROPONINI in the last 168 hours.  BNP (last 3 results) No results for input(s): PROBNP in the last 8760 hours.  HbA1C: No results for input(s): HGBA1C in the last 72 hours.  CBG: Recent Labs  Lab 12/02/17 0829 12/02/17 0918 12/03/17 0624  12/03/17 0749 12/04/17 0801  GLUCAP 69 98 105* 104* 136*    Lipid Profile: No results for input(s): CHOL, HDL, LDLCALC, TRIG, CHOLHDL, LDLDIRECT in the last 72 hours.  Thyroid Function Tests: No results for input(s): TSH, T4TOTAL, FREET4, T3FREE, THYROIDAB in the last 72 hours.  Anemia Panel: No results for input(s): VITAMINB12, FOLATE, FERRITIN, TIBC, IRON, RETICCTPCT in the last 72 hours.  No results found for this or any previous visit (from the past 240 hour(s)).    Radiology Studies: Dg Abd 1 View  Result Date: 12/03/2017 CLINICAL DATA:  NG tube placement EXAM: ABDOMEN - 1 VIEW COMPARISON:  12/03/2017 600 hours FINDINGS: NG tube has been placed. The tip is in the fundus of the stomach. There is barium in the stomach and colon. Colon and stomach are dilated. IMPRESSION: NG tube tip is in the fundus of the stomach. Electronically Signed   By: Marybelle Killings M.D.   On: 12/03/2017 11:52   Dg Abd Portable 1v  Result Date: 12/04/2017 CLINICAL DATA:  Patient with history of obstruction. EXAM: PORTABLE ABDOMEN - 1 VIEW COMPARISON:  Abdominal radiograph 12/03/2017. FINDINGS: Enteric tube projects over the left upper quadrant. Residual oral contrast within the stomach. Small amount of contrast within the cecum and ascending colon. Gaseous distended loops of bowel throughout the abdomen. IMPRESSION: Persistent contrast within the stomach. Gaseous distended loops of bowel throughout the abdomen suggestive of ileus. Electronically Signed   By: Lovey Newcomer M.D.   On: 12/04/2017 12:06   Dg Abd Portable 1v  Result Date: 12/03/2017 CLINICAL DATA:  Gastric outlet obstruction. EXAM: PORTABLE ABDOMEN - 1 VIEW COMPARISON:  Abdominal x-ray from yesterday. FINDINGS: Unchanged positioning of the nasogastric tube with the tip in the proximal stomach. Large amount of residual contrast within the stomach is grossly unchanged. Air-filled colon is similar to prior study. Borderline small bowel dilatation, also  unchanged. IMPRESSION: 1. Unchanged nasogastric tube within the proximal stomach, with evidence of gastric outlet obstruction. 2. Unchanged ileus. Electronically Signed   By: Titus Dubin M.D.   On: 12/03/2017 09:00     Medications:  Scheduled: . levothyroxine  25 mcg Intravenous Daily  . pantoprazole (PROTONIX) IV  40 mg Intravenous Q12H   Continuous: . 0.45 % NaCl with KCl 20 mEq / L 50 mL/hr at 12/04/17 0630   [id number], benzocaine, glycopyrrolate, haloperidol lactate, HYDROmorphone (DILAUDID) injection, LORazepam, ondansetron (ZOFRAN) IV, phenol  Assessment/Plan:  Active Problems:   Gastric outlet obstruction   Pancreatic mass   Palliative care by specialist   Goals of care, counseling/discussion   Gastric distention   Agitation   Abnormal CT of the abdomen    Gastric outlet obstruction secondary  to mechanical compression by pancreatic mass Seen by general surgery.  Not thought to be of good candidate for surgical intervention.   Underwent upper GI series. Palliative care, GI as well as general surgery discussed with family today. Goal is for hospice with comfort care protocol. GI will consider palliative stenting. Continue NG tube for now.  A Continue again continuous suction followed by low intermittent suction again.  Lactic acidosis Etiology unclear but could be due to nausea vomiting and dehydration.  Lactic acid levels have improved significantly.  Continue hydration.  History of hypothyroidism Currently receiving levothyroxine intravenously.  History of GERD Questionable reports of coffee-ground emesis.  Hemoglobin remains stable.  Continue IV PPI.  Stop his Lovenox.  Throat pain. Use Orajel.  Acute metabolic encephalopathy. Likely toxic from medication Morphine changed to Dilaudid. Continue PRN Haldol.  DVT Prophylaxis: SCDs     Code Status: Full code Family Communication: Family at bedside Disposition Plan: Continue nasogastric suction  and perform endoscopic stent placement once able.    LOS: 5 days   Berle Mull 12/04/2017, 5:16 PM  If 7PM-7AM, please contact night-coverage at [email or web address], password [facility]

## 2017-12-05 DIAGNOSIS — R634 Abnormal weight loss: Secondary | ICD-10-CM

## 2017-12-05 LAB — GLUCOSE, CAPILLARY: GLUCOSE-CAPILLARY: 88 mg/dL (ref 65–99)

## 2017-12-05 MED ORDER — METHOCARBAMOL 1000 MG/10ML IJ SOLN
500.0000 mg | Freq: Three times a day (TID) | INTRAVENOUS | Status: DC | PRN
  Administered 2017-12-06: 500 mg via INTRAVENOUS
  Filled 2017-12-05 (×2): qty 5

## 2017-12-05 MED ORDER — PANTOPRAZOLE SODIUM 40 MG IV SOLR
40.0000 mg | INTRAVENOUS | Status: DC
  Administered 2017-12-06 – 2017-12-07 (×2): 40 mg via INTRAVENOUS
  Filled 2017-12-05 (×2): qty 40

## 2017-12-05 MED ORDER — HALOPERIDOL LACTATE 5 MG/ML IJ SOLN
2.0000 mg | Freq: Once | INTRAMUSCULAR | Status: AC
  Administered 2017-12-05: 2 mg via INTRAVENOUS
  Filled 2017-12-05: qty 1

## 2017-12-05 MED ORDER — BISACODYL 10 MG RE SUPP
10.0000 mg | Freq: Once | RECTAL | Status: AC
  Administered 2017-12-05: 10 mg via RECTAL
  Filled 2017-12-05: qty 1

## 2017-12-05 NOTE — H&P (View-Only) (Signed)
Daily Rounding Note  12/05/2017, 9:00 AM  LOS: 6 days   SUBJECTIVE:   Chief complaint: GOO.   Paroxysmal coughing/choking but not producing sputum.  NGT in place.  Occasional left abdominal discomfort. Confusion and awakening due to cough and due to dream where he feels like he is falling.   1200 cc of NGT output yesterday, 600 cc today.  NO BM for > 1 week.     OBJECTIVE:         Vital signs in last 24 hours:    Temp:  [98.2 F (36.8 C)-98.3 F (36.8 C)] 98.2 F (36.8 C) (03/18 0528) Pulse Rate:  [56-86] 56 (03/18 0528) Resp:  [20-21] 20 (03/18 0528) BP: (149-161)/(74-89) 150/74 (03/18 0528) SpO2:  [97 %-99 %] 97 % (03/18 0528) Weight:  [151 lb 0.2 oz (68.5 kg)] 151 lb 0.2 oz (68.5 kg) (03/18 0528) Last BM Date: 11/28/17 Filed Weights   12/02/17 0420 12/03/17 0500 12/05/17 0528  Weight: 148 lb 5.9 oz (67.3 kg) 155 lb 3.3 oz (70.4 kg) 151 lb 0.2 oz (68.5 kg)   General: somewhat frail.  Awakened himself after coughing.  Speech a bit garbled but appropriate  ENT: dry but clear oral MM.    Heart: irreg, irreg Chest: diminished BS but clear.  Choking type cough, not bringing up sputum.   Abdomen: soft, ND  Extremities: no CCE.  Feet warm Neuro/Psych:  Oriented to place, self, not to date.  Speech garbled which is not helped by his very dry oral mucosa and presence of NGT.  Tremor, intentional >> resting, in hands  Intake/Output from previous day: 03/17 0701 - 03/18 0700 In: 1230 [I.V.:1200; NG/GT:30] Out: 1200 [Urine:1000; Emesis/NG output:200]  Intake/Output this shift: No intake/output data recorded.  Lab Results: No results for input(s): WBC, HGB, HCT, PLT in the last 72 hours. BMET Recent Labs    12/03/17 0740  NA 131*  K 4.8  CL 103  CO2 22  GLUCOSE 346*  BUN 5*  CREATININE 0.73  CALCIUM 7.7*    Studies/Results: Dg Abd 1 View  Result Date: 12/03/2017 CLINICAL DATA:  NG tube placement EXAM:  ABDOMEN - 1 VIEW COMPARISON:  12/03/2017 600 hours FINDINGS: NG tube has been placed. The tip is in the fundus of the stomach. There is barium in the stomach and colon. Colon and stomach are dilated. IMPRESSION: NG tube tip is in the fundus of the stomach. Electronically Signed   By: Marybelle Killings M.D.   On: 12/03/2017 11:52   Dg Abd Portable 1v  Result Date: 12/04/2017 CLINICAL DATA:  Patient with history of obstruction. EXAM: PORTABLE ABDOMEN - 1 VIEW COMPARISON:  Abdominal radiograph 12/03/2017. FINDINGS: Enteric tube projects over the left upper quadrant. Residual oral contrast within the stomach. Small amount of contrast within the cecum and ascending colon. Gaseous distended loops of bowel throughout the abdomen. IMPRESSION: Persistent contrast within the stomach. Gaseous distended loops of bowel throughout the abdomen suggestive of ileus. Electronically Signed   By: Lovey Newcomer M.D.   On: 12/04/2017 12:06   Scheduled Meds: . levothyroxine  25 mcg Intravenous Daily  . pantoprazole (PROTONIX) IV  40 mg Intravenous Q12H   Continuous Infusions: . 0.45 % NaCl with KCl 20 mEq / L 50 mL/hr at 12/04/17 2327   PRN Meds:.acetaminophen, benzocaine, glycopyrrolate, haloperidol lactate, HYDROmorphone (DILAUDID) injection, LORazepam, ondansetron (ZOFRAN) IV, phenol   ASSESMENT:   *   GOO. ? From pancreatic uncinate  mass/? cancer vs ulcer.  Mass is stable c/w 1 month ago.  LFTs normal.  NGT in place. GOO with retained contrast and ileus persist per KUB this AM.  Waiting on adequate clearing of retained gastric contents to safely sedate and perform EGD with lowest possible risk of aspiration.   On BID IV Protonix.    *  Hyperglycemia.  Looks like new onset DM. Glucose 346.   *  Normocytic anemia.  Hgb 12.1 >> 10.4 as of 3/14  *  Hyponatremia.  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  On ASA 325 mg only but this is now on hold.     PLAN   *   EGD, ? Timing.  To determine etiology of GOO and  define treatment options (stenting vs dilatation).  Dr Loletha Carrow will be GI MD this week and make final deterimation re timing of EGD.    *  Family does not want surgery but hoping stenting will help with palliation.     *   Dulcolax PR to see if it will help ileus.  ? Add a dose or 2 of Reglan?  *    CBC in AM.      Azucena Freed  12/05/2017, 9:00 AM Pager: [phone]   I have discussed the case with the PA, and that is the plan I formulated. I personally interviewed and examined the patient.  CC: Gastric outlet obstruction  This is a complex patient with a high-grade gastric outlet obstruction and a mass in the neck of the pancreas.  It is not causing biliary obstruction. He has had an NG tube in for several days and it seems that the output has dropped off.  However, there may be food debris in there that cannot be removed with NG tube. Based on the imaging that I have personally reviewed, it looks like this pancreatic mass is directly adjacent to, and probably causing, the gastric outlet obstruction.  Other considerations are primary gastric malignancy and large gastric ulcer.  I spoke with this patient's wife at the bedside, and also extensively with his daughter Caren Griffins at the bedside.  She understands the complexity of the situation and are limited options.  I think an upper endoscopy under general anesthesia for airway protection is reasonable to attempt to define the nature of this obstruction and see what if any treatment options are available.  He is clearly not a surgical candidate given his advanced age and condition.  Perhaps an enteral stent might be feasible depending upon endoscopic findings.  The nature of the pancreatic mass is uncertain, but I am concerned it is malignant.  CA 19-9 ordered for tomorrow AM.  If very elevated, highly suggestive of cancer.  Would not pursue further testing such as biopsy given his advanced age and poor condition.  I discussed the upper endoscopy  in detail with his daughter Caren Griffins and his wife, and they are agreeable.  Consent was signed and witnessed by nursing. . The benefits and risks of the planned procedure were described in detail with the patient or (when appropriate) their health care proxy.  Risks were outlined as including, but not limited to, bleeding, infection, perforation, adverse medication reaction leading to cardiac or pulmonary decompensation, or pancreatitis (if ERCP).  The limitation of incomplete mucosal visualization was also discussed.  No guarantees or warranties were given.  Patient at increased risk for cardiopulmonary complications of procedure due to medical comorbidities.  Further plan to follow after tomorrow's upper  endoscopy.  Total time 35 minutes, over half spent in record review and discussion with patient's family.    [name] Pager [phone]  Mon-Fri 8a-5p [phone] after 5p, weekends, holidays

## 2017-12-05 NOTE — Progress Notes (Signed)
Daily Progress Note   Patient Name: Mario May       Date: 12/05/2017 DOB: 26-Dec-1914  Age: 84 y.o. MRN#: [id number] Attending Physician: Lavina Hamman, MD Primary Care Physician: Dione Housekeeper, MD Admit Date: 11/29/2017  Reason for Consultation/Follow-up: Establishing goals of care, Non pain symptom management, Pain control and Psychosocial/spiritual support  Subjective: Patient is more awake and alert this morning. Son Mario May) is at the bedside. Son reports Mario May did not get much sleep last night. States once he got asleep he would wake up jumping and frantic stating "don't let me fall!" He continues to pull at his NG tube, however follows command when asked not to pull on tube. Sitter is at the bedside for safety. Mario May denies pain and appears to be comfortable. Nurse at bedside administering medications via IV.   Son reports speaking to GI providers and plan is to possibly proceed with EGD and palliative stent placement on tomorrow. Son is hopeful after procedure that Mario May will be able to have NG removed and possibly take in po food and or liquids. Also states that they are hoping this will give him a few more months or weeks to live with increased comfort.   This afternoon, spoke with daughter and wife as well who also verbalized the plan for EGD with possibility of palliative stent placement. Family verbalizes the end goal is to ultimately provide comfort and proceed with hospice care. Daughter verbalized wanting to pursue residential hospice once procedure is performed and team has a better understanding of outcome. We discussed that he may not be a candidate for residential hospice post procedure however, could go home with services versus SNF placement with  Palliative services. Advised we will continue to monitor and take care one day at at time. Family agreed.   Discussed case with Dr. Posey Pronto who feels stent placement could extend life and improve quality for several months, however, there is also a risk of decline during and after procedure.   Length of Stay: 6  Current Medications: Scheduled Meds:  . levothyroxine  25 mcg Intravenous Daily  . pantoprazole (PROTONIX) IV  40 mg Intravenous Q12H    Continuous Infusions: . 0.45 % NaCl with KCl 20 mEq / L 50 mL/hr at 12/04/17 2327    PRN Meds: acetaminophen, benzocaine,  glycopyrrolate, haloperidol lactate, HYDROmorphone (DILAUDID) injection, LORazepam, ondansetron (ZOFRAN) IV, phenol  Physical Exam  Constitutional: He is cooperative.  Pulmonary/Chest: Effort normal.  Abdominal: Soft.  nondistended   Musculoskeletal:  Generalized weakness   Neurological: He is alert.  Hx of agitation and confusion   Nursing note and vitals reviewed.           Vital Signs: BP (!) 150/74 (BP Location: Left Arm)   Pulse (!) 56   Temp 98.2 F (36.8 C) (Oral)   Resp 20   Ht 5\' 11"  (1.803 m)   Wt 68.5 kg (151 lb 0.2 oz)   SpO2 97%   BMI 21.06 kg/m  SpO2: SpO2: 97 % O2 Device: O2 Device: Room Air O2 Flow Rate:    Intake/output summary:   Intake/Output Summary (Last 24 hours) at 12/05/2017 1106 Last data filed at 12/05/2017 3151 Gross per 24 hour  Intake 1230 ml  Output 1800 ml  Net -570 ml   LBM: Last BM Date: 11/28/17 Baseline Weight: Weight: 68 kg (150 lb) Most recent weight: Weight: 68.5 kg (151 lb 0.2 oz)       Palliative Assessment/Data:PPS 20%    Flowsheet Rows     Most Recent Value  Intake Tab  Referral Department  Hospitalist  Unit at Time of Referral  Med/Surg Unit  Palliative Care Primary Diagnosis  Cancer  Date Notified  11/30/17  Palliative Care Type  Return patient Palliative Care  Reason for referral  Clarify Goals of Care  Date of Admission  11/29/17  Date first  seen by Palliative Care  12/01/17  # of days Palliative referral response time  1 Day(s)  # of days IP prior to Palliative referral  1  Clinical Assessment  Psychosocial & Spiritual Assessment  Palliative Care Outcomes      Patient Active Problem List   Diagnosis Date Noted  . Gastric distention   . Agitation   . Abnormal CT of the abdomen   . Pancreatic mass   . Palliative care by specialist   . Goals of care, counseling/discussion   . Gastric outlet obstruction 11/29/2017  . Stroke (cerebrum) ([place]) 10/28/2017  . CAP (community acquired pneumonia) 08/21/2017  . Neck pain 08/21/2017  . Bradycardia 01/23/2017  . Atrial fibrillation with slow ventricular response ([place]) 01/23/2017  . Dehydration 08/19/2016  . Elevated troponin 08/18/2016  . AKI (acute kidney injury) ([place]) 08/18/2016  . Difficulty in walking, not elsewhere classified   . Gait instability   . Left leg cellulitis 06/05/2016  . Orthostatic hypotension 06/04/2016  . 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 Lumbar foraminal stenosis 06/04/2016  . DJD (degenerative joint disease), multiple sites 06/04/2016  . Fever 06/04/2016  . Leukocytosis 06/04/2016  . Stasis dermatitis of both legs 07/01/2013  . Essential hypertension 03/15/2013  . Hypothyroidism, adult 03/18/2009  . CONSTIPATION 03/18/2009  . CHANGE IN BOWELS 03/18/2009    Palliative Care Assessment & Plan   Patient Profile: 84 y.o.maleadmitted on 3/12/2019with abdominal pain, distention, nausea, and vomiting. He has a past medical history significant forknown pancreatic mass, prior history of small bowel obstruction, atrial fibrillation not on systemic anticoagulation, hypertension, GERD, andhypothyroidism.Patient has been unable to tolerate solid intake over the last few weeks, and it worsened to the point at whichhe wasunable to keep down any solids or liquids. He notes multiple episodes of dark brown emesis prior to presentation  to the ED. Patient's known pancreatic mass at the uncinate process was first identified in April 2018.The mass  is being followed expectantly. CT A/P was obtained in the ED demonstrating significant gastric distention, likely related to narrowing within the second portion of the duodenum.Patient is receiving hydration and a NG tube was placed on second attempt.General surgery was consulted in the ED; they feel the patient is not a surgical candidate at this time. Palliative Medicine has been consulted for goals of care.  Assessment: Active Problems: Gastric outlet obstruction Pancreatic mass Palliative care by specialist DNR (do not resuscitate) discussion   History of hypothyroidism   History of GERD   Lactic acidosis    Recommendations/Plan:  DNR/DNI  Continue to treat the treatable per attending. Family is awaiting possible EGD with palliative stent placement on tomorrow per GI.  Possible intubation during procedure for aspiration prevention.   Continue with Dilaudid 0.5,mg every 2hrs for pain and dyspnea  Agree with Haldol and Ativan as ordered for agitation/anxiety   Palliative medicine team will continue to provide support to patient, family, and medical team during hospitalization.  Goals of Care and Additional Recommendations: ? Limitations on Scope of Treatment: Full Scope Treatment and continue to await GI input regarding potential endoscopy/palliative stent placement. If not a candidate. Family considering shifting to full comfort care with symtpom management   Code Status: DNR   Code Status Orders  (From admission, onward)        Start     Ordered   12/02/17 1101  Do not attempt resuscitation (DNR)  Continuous    Question Answer Comment  In the event of cardiac or respiratory ARREST Do not call a "code blue"   In the event of cardiac or respiratory ARREST Do not perform Intubation, CPR, defibrillation or ACLS   In the event of cardiac or  respiratory ARREST Use medication by any route, position, wound care, and other measures to relive pain and suffering. May use oxygen, suction and manual treatment of airway obstruction as needed for comfort.      12/02/17 1100      Prognosis:   Guarded to poor in the setting of SBO (not a surgical candidate),  known pancreatic mass, malnutrition, poor po intake, immobility, AMS   Discharge Planning:  To Be Determined. Will continue in discussion with family. With stent placement may not be a candidate for residential hospice facility. As guidelines are life expectancy of 2-3 weeks or less. Will evaluate closely post procedures for signs of improvement versus decline.   Care plan was discussed with patient's family, bedside RN, and Dr. Posey Pronto.   Thank you for allowing the Palliative Medicine Team to assist in the care of this patient.   Total Time 45 min Prolonged Time Billed  No       Greater than 50%  of this time was spent counseling and coordinating care related to the above assessment and plan.  Alda Lea, AGNP-C Ihor Dow, FNP-C Palliative Medicine Team  Phone: [phone] Fax: [phone]  Please contact Palliative Medicine Team phone at [phone] for questions and concerns.

## 2017-12-05 NOTE — Progress Notes (Signed)
   Subjective/Chief Complaint: Just had medicine so is sleepy   Objective: Vital signs in last 24 hours: Temp:  [98.2 F (36.8 C)-98.3 F (36.8 C)] 98.2 F (36.8 C) (03/18 0528) Pulse Rate:  [56-86] 56 (03/18 0528) Resp:  [20-21] 20 (03/18 0528) BP: (149-161)/(74-89) 150/74 (03/18 0528) SpO2:  [97 %-99 %] 97 % (03/18 0528) Weight:  [68.5 kg (151 lb 0.2 oz)] 68.5 kg (151 lb 0.2 oz) (03/18 0528) Last BM Date: 11/28/17  Intake/Output from previous day: 03/17 0701 - 03/18 0700 In: 1230 [I.V.:1200; NG/GT:30] Out: 1200 [Urine:1000; Emesis/NG output:200] Intake/Output this shift: No intake/output data recorded.  General appearance: no distress Resp: clear to auscultation bilaterally GI: soft, ND, NT  Lab Results:  No results for input(s): WBC, HGB, HCT, PLT in the last 72 hours. BMET Recent Labs    12/03/17 0740  NA 131*  K 4.8  CL 103  CO2 22  GLUCOSE 346*  BUN 5*  CREATININE 0.73  CALCIUM 7.7*   PT/INR No results for input(s): LABPROT, INR in the last 72 hours. ABG No results for input(s): PHART, HCO3 in the last 72 hours.  Invalid input(s): PCO2, PO2  Studies/Results: Dg Abd 1 View  Result Date: 12/03/2017 CLINICAL DATA:  NG tube placement EXAM: ABDOMEN - 1 VIEW COMPARISON:  12/03/2017 600 hours FINDINGS: NG tube has been placed. The tip is in the fundus of the stomach. There is barium in the stomach and colon. Colon and stomach are dilated. IMPRESSION: NG tube tip is in the fundus of the stomach. Electronically Signed   By: Marybelle Killings M.D.   On: 12/03/2017 11:52   Dg Abd Portable 1v  Result Date: 12/04/2017 CLINICAL DATA:  Patient with history of obstruction. EXAM: PORTABLE ABDOMEN - 1 VIEW COMPARISON:  Abdominal radiograph 12/03/2017. FINDINGS: Enteric tube projects over the left upper quadrant. Residual oral contrast within the stomach. Small amount of contrast within the cecum and ascending colon. Gaseous distended loops of bowel throughout the abdomen.  IMPRESSION: Persistent contrast within the stomach. Gaseous distended loops of bowel throughout the abdomen suggestive of ileus. Electronically Signed   By: Lovey Newcomer M.D.   On: 12/04/2017 12:06    Anti-infectives: Anti-infectives (From admission, onward)   None      Assessment/Plan: GOO due to pancreatic mass - GI plans EGD and stent if possible. I spoke with his son and he reported at this point they would not want him to undergo any surgery. Please recall PRN.  LOS: 6 days    Mario May 12/05/2017

## 2017-12-05 NOTE — Progress Notes (Addendum)
Daily Rounding Note  12/05/2017, 9:00 AM  LOS: 6 days   SUBJECTIVE:   Chief complaint: GOO.   Paroxysmal coughing/choking but not producing sputum.  NGT in place.  Occasional left abdominal discomfort. Confusion and awakening due to cough and due to dream where he feels like he is falling.   1200 cc of NGT output yesterday, 600 cc today.  NO BM for > 1 week.     OBJECTIVE:         Vital signs in last 24 hours:    Temp:  [98.2 F (36.8 C)-98.3 F (36.8 C)] 98.2 F (36.8 C) (03/18 0528) Pulse Rate:  [56-86] 56 (03/18 0528) Resp:  [20-21] 20 (03/18 0528) BP: (149-161)/(74-89) 150/74 (03/18 0528) SpO2:  [97 %-99 %] 97 % (03/18 0528) Weight:  [151 lb 0.2 oz (68.5 kg)] 151 lb 0.2 oz (68.5 kg) (03/18 0528) Last BM Date: 11/28/17 Filed Weights   12/02/17 0420 12/03/17 0500 12/05/17 0528  Weight: 148 lb 5.9 oz (67.3 kg) 155 lb 3.3 oz (70.4 kg) 151 lb 0.2 oz (68.5 kg)   General: somewhat frail.  Awakened himself after coughing.  Speech a bit garbled but appropriate  ENT: dry but clear oral MM.    Heart: irreg, irreg Chest: diminished BS but clear.  Choking type cough, not bringing up sputum.   Abdomen: soft, ND  Extremities: no CCE.  Feet warm Neuro/Psych:  Oriented to place, self, not to date.  Speech garbled which is not helped by his very dry oral mucosa and presence of NGT.  Tremor, intentional >> resting, in hands  Intake/Output from previous day: 03/17 0701 - 03/18 0700 In: 1230 [I.V.:1200; NG/GT:30] Out: 1200 [Urine:1000; Emesis/NG output:200]  Intake/Output this shift: No intake/output data recorded.  Lab Results: No results for input(s): WBC, HGB, HCT, PLT in the last 72 hours. BMET Recent Labs    12/03/17 0740  NA 131*  K 4.8  CL 103  CO2 22  GLUCOSE 346*  BUN 5*  CREATININE 0.73  CALCIUM 7.7*    Studies/Results: Dg Abd 1 View  Result Date: 12/03/2017 CLINICAL DATA:  NG tube placement EXAM:  ABDOMEN - 1 VIEW COMPARISON:  12/03/2017 600 hours FINDINGS: NG tube has been placed. The tip is in the fundus of the stomach. There is barium in the stomach and colon. Colon and stomach are dilated. IMPRESSION: NG tube tip is in the fundus of the stomach. Electronically Signed   By: Marybelle Killings M.D.   On: 12/03/2017 11:52   Dg Abd Portable 1v  Result Date: 12/04/2017 CLINICAL DATA:  Patient with history of obstruction. EXAM: PORTABLE ABDOMEN - 1 VIEW COMPARISON:  Abdominal radiograph 12/03/2017. FINDINGS: Enteric tube projects over the left upper quadrant. Residual oral contrast within the stomach. Small amount of contrast within the cecum and ascending colon. Gaseous distended loops of bowel throughout the abdomen. IMPRESSION: Persistent contrast within the stomach. Gaseous distended loops of bowel throughout the abdomen suggestive of ileus. Electronically Signed   By: Lovey Newcomer M.D.   On: 12/04/2017 12:06   Scheduled Meds: . levothyroxine  25 mcg Intravenous Daily  . pantoprazole (PROTONIX) IV  40 mg Intravenous Q12H   Continuous Infusions: . 0.45 % NaCl with KCl 20 mEq / L 50 mL/hr at 12/04/17 2327   PRN Meds:.acetaminophen, benzocaine, glycopyrrolate, haloperidol lactate, HYDROmorphone (DILAUDID) injection, LORazepam, ondansetron (ZOFRAN) IV, phenol   ASSESMENT:   *   GOO. ? From pancreatic uncinate  mass/? cancer vs ulcer.  Mass is stable c/w 1 month ago.  LFTs normal.  NGT in place. GOO with retained contrast and ileus persist per KUB this AM.  Waiting on adequate clearing of retained gastric contents to safely sedate and perform EGD with lowest possible risk of aspiration.   On BID IV Protonix.    *  Hyperglycemia.  Looks like new onset DM. Glucose 346.   *  Normocytic anemia.  Hgb 12.1 >> 10.4 as of 3/14  *  Hyponatremia.  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  On ASA 325 mg only but this is now on hold.     PLAN   *   EGD, ? Timing.  To determine etiology of GOO and  define treatment options (stenting vs dilatation).  Dr Loletha Carrow will be GI MD this week and make final deterimation re timing of EGD.    *  Family does not want surgery but hoping stenting will help with palliation.     *   Dulcolax PR to see if it will help ileus.  ? Add a dose or 2 of Reglan?  *    CBC in AM.      Mario May  12/05/2017, 9:00 AM Pager: [phone]   I have discussed the case with the PA, and that is the plan I formulated. I personally interviewed and examined the patient.  CC: Gastric outlet obstruction  This is a complex patient with a high-grade gastric outlet obstruction and a mass in the neck of the pancreas.  It is not causing biliary obstruction. He has had an NG tube in for several days and it seems that the output has dropped off.  However, there may be food debris in there that cannot be removed with NG tube. Based on the imaging that I have personally reviewed, it looks like this pancreatic mass is directly adjacent to, and probably causing, the gastric outlet obstruction.  Other considerations are primary gastric malignancy and large gastric ulcer.  I spoke with this patient's wife at the bedside, and also extensively with his daughter Caren Griffins at the bedside.  She understands the complexity of the situation and are limited options.  I think an upper endoscopy under general anesthesia for airway protection is reasonable to attempt to define the nature of this obstruction and see what if any treatment options are available.  He is clearly not a surgical candidate given his advanced age and condition.  Perhaps an enteral stent might be feasible depending upon endoscopic findings.  The nature of the pancreatic mass is uncertain, but I am concerned it is malignant.  CA 19-9 ordered for tomorrow AM.  If very elevated, highly suggestive of cancer.  Would not pursue further testing such as biopsy given his advanced age and poor condition.  I discussed the upper endoscopy  in detail with his daughter Caren Griffins and his wife, and they are agreeable.  Consent was signed and witnessed by nursing. . The benefits and risks of the planned procedure were described in detail with the patient or (when appropriate) their health care proxy.  Risks were outlined as including, but not limited to, bleeding, infection, perforation, adverse medication reaction leading to cardiac or pulmonary decompensation, or pancreatitis (if ERCP).  The limitation of incomplete mucosal visualization was also discussed.  No guarantees or warranties were given.  Patient at increased risk for cardiopulmonary complications of procedure due to medical comorbidities.  Further plan to follow after tomorrow's upper  endoscopy.  Total time 35 minutes, over half spent in record review and discussion with patient's family.    [name] Pager [phone]  Mon-Fri 8a-5p [phone] after 5p, weekends, holidays

## 2017-12-05 NOTE — Progress Notes (Signed)
[facility] PROGRESS NOTE  Mario May OIN:[id number] DOB: [birth year] DOA: 11/29/2017  PCP: Dione Housekeeper, MD  Brief History/Interval Summary: 84 year old Caucasian male with a past medical history significant for known pancreatic mass, prior history of small bowel obstruction, atrial fibrillation not on systemic anticoagulation, hypertension hypothyroidism presented with abdominal pain distention nausea and vomiting.  He has been unable to tolerate solid foods over the last several weeks.  Patient has lost weight.  Evaluation revealed significant gastric distention.  Narrowing of the second portion of duodenum.  General surgery was consulted.  They do not feel that the patient is a candidate for surgical intervention.  Reason for Visit: Gastric outlet obstruction due to pancreatic mass  Consultants: General surgery.  Gastroenterology  Procedures: None yet  Antibiotics: None  Subjective/Interval History: Patient more awake this morning, having some neck pain.  No nausea no vomiting.  ROS: Denies any chest pain, abdominal pain, nausea  Objective:  Vital Signs  Vitals:   12/04/17 1329 12/04/17 2100 12/05/17 0528 12/05/17 1211  BP: (!) 149/83 (!) 161/89 (!) 150/74 (!) 155/66  Pulse: 86 61 (!) 56 (!) 55  Resp: (!) 21 20 20 18   Temp: 98.2 F (36.8 C) 98.3 F (36.8 C) 98.2 F (36.8 C) 98 F (36.7 C)  TempSrc: Axillary Axillary Oral Axillary  SpO2: 99% 98% 97% 99%  Weight:   68.5 kg (151 lb 0.2 oz)   Height:        Intake/Output Summary (Last 24 hours) at 12/05/2017 1614 Last data filed at 12/05/2017 1526 Gross per 24 hour  Intake 1701.67 ml  Output 1800 ml  Net -98.33 ml   Filed Weights   12/02/17 0420 12/03/17 0500 12/05/17 0528  Weight: 67.3 kg (148 lb 5.9 oz) 70.4 kg (155 lb 3.3 oz) 68.5 kg (151 lb 0.2 oz)    General appearance: alert, cooperative, appears stated age and no distress Head: Normocephalic, without obvious abnormality, atraumatic Resp:  clear to auscultation bilaterally Cardio: regular rate and rhythm, S1, S2 normal, no murmur, click, rub or gallop GI: Abdomen is soft.  Mildly tender in the epigastric area without any rebound rigidity or guarding.  No obvious masses organomegaly appreciated.  Bowel sounds sluggish. Extremities: extremities normal, atraumatic, no cyanosis or edema Pulses: 2+ and symmetric Neurologic: Grossly normal  Lab Results:  Data Reviewed: I have personally reviewed following labs and imaging studies  CBC: Recent Labs  Lab 11/29/17 1800 11/30/17 0500 12/01/17 0612  WBC 7.7 10.5 7.9  NEUTROABS 6.6  --   --   HGB 12.5* 12.1* 10.4*  HCT 38.8* 36.3* 31.9*  MCV 98.7 98.4 98.2  PLT 237 202 709    Basic Metabolic Panel: Recent Labs  Lab 11/29/17 1800 11/30/17 0007 11/30/17 0500 12/01/17 0612 12/02/17 0803 12/03/17 0740  NA 136  --  135 135 137 131*  K 3.5  --  4.0 3.6 3.4* 4.8  CL 98*  --  103 105 107 103  CO2 19*  --  21* 21* 18* 22  GLUCOSE 125*  --  97 86 73 346*  BUN 16  --  17 16 14  5*  CREATININE 1.18  --  1.08 0.94 0.92 0.73  CALCIUM 9.4  --  8.9 8.4* 8.3* 7.7*  MG  --  1.9  --   --   --   --   PHOS  --  3.5  --   --   --   --     GFR: Estimated Creatinine  Clearance: 47.6 mL/min (by C-G formula based on SCr of 0.73 mg/dL).  Liver Function Tests: Recent Labs  Lab 11/29/17 1800 11/30/17 0500 12/01/17 0612  AST 41 35 24  ALT 11* 10* 8*  ALKPHOS 155* 132* 110  BILITOT 0.9 1.1 1.6*  PROT 7.2 6.4* 5.5*  ALBUMIN 3.6 3.1* 2.6*    Recent Labs  Lab 11/29/17 1800  LIPASE 32   No results for input(s): AMMONIA in the last 168 hours.  Coagulation Profile: Recent Labs  Lab 11/30/17 0007  INR 1.07    Cardiac Enzymes: No results for input(s): CKTOTAL, CKMB, CKMBINDEX, TROPONINI in the last 168 hours.  BNP (last 3 results) No results for input(s): PROBNP in the last 8760 hours.  HbA1C: No results for input(s): HGBA1C in the last 72 hours.  CBG: Recent Labs    Lab 12/02/17 0918 12/03/17 0624 12/03/17 0749 12/04/17 0801 12/05/17 0741  GLUCAP 98 105* 104* 136* 88    Lipid Profile: No results for input(s): CHOL, HDL, LDLCALC, TRIG, CHOLHDL, LDLDIRECT in the last 72 hours.  Thyroid Function Tests: No results for input(s): TSH, T4TOTAL, FREET4, T3FREE, THYROIDAB in the last 72 hours.  Anemia Panel: No results for input(s): VITAMINB12, FOLATE, FERRITIN, TIBC, IRON, RETICCTPCT in the last 72 hours.  No results found for this or any previous visit (from the past 240 hour(s)).    Radiology Studies: Dg Abd Portable 1v  Result Date: 12/04/2017 CLINICAL DATA:  Patient with history of obstruction. EXAM: PORTABLE ABDOMEN - 1 VIEW COMPARISON:  Abdominal radiograph 12/03/2017. FINDINGS: Enteric tube projects over the left upper quadrant. Residual oral contrast within the stomach. Small amount of contrast within the cecum and ascending colon. Gaseous distended loops of bowel throughout the abdomen. IMPRESSION: Persistent contrast within the stomach. Gaseous distended loops of bowel throughout the abdomen suggestive of ileus. Electronically Signed   By: Lovey Newcomer M.D.   On: 12/04/2017 12:06     Medications:  Scheduled: . levothyroxine  25 mcg Intravenous Daily  . [START ON 12/06/2017] pantoprazole (PROTONIX) IV  40 mg Intravenous Q24H   Continuous: . 0.45 % NaCl with KCl 20 mEq / L 50 mL/hr at 12/04/17 2327   PYP:[id number], benzocaine, glycopyrrolate, haloperidol lactate, HYDROmorphone (DILAUDID) injection, LORazepam, ondansetron (ZOFRAN) IV, phenol  Assessment/Plan:  Active Problems:   Gastric outlet obstruction   Pancreatic mass   Palliative care by specialist   Goals of care, counseling/discussion   Gastric distention   Agitation   Abnormal CT of the abdomen  Gastric outlet obstruction secondary to mechanical compression by pancreatic mass Seen by general surgery.  Not thought to be of good candidate for surgical intervention.    Underwent upper GI series. Palliative care, GI as well as general surgery discussed with family today. Goal is for hospice with comfort care protocol. GI will consider palliative stenting. Continue NG tube for now.   GI considering general anesthesia for the patient for endoscopy, will check BMP and CBC tomorrow preoperatively.  Lactic acidosis Etiology unclear but could be due to nausea vomiting and dehydration.  Lactic acid levels have improved significantly.  Continue hydration.  History of hypothyroidism Currently receiving levothyroxine intravenously.  History of GERD Questionable reports of coffee-ground emesis.  Hemoglobin remains stable.  Continue IV PPI.  Stop his Lovenox.  Throat pain. Use Orajel.  Acute metabolic encephalopathy. Likely toxic from medication Morphine changed to Dilaudid. Continue PRN Haldol.  Neck pain PRN robaxin  DVT Prophylaxis: SCDs     Code Status: Full code  Family Communication: Family at bedside Disposition Plan: Continue nasogastric suction and perform endoscopic stent placement once able.    LOS: 6 days   Berle Mull 12/05/2017, 4:14 PM  If 7PM-7AM, please contact night-coverage at [email or web address], password [facility]

## 2017-12-06 ENCOUNTER — Encounter (HOSPITAL_COMMUNITY): Payer: Self-pay | Admitting: *Deleted

## 2017-12-06 ENCOUNTER — Encounter (HOSPITAL_COMMUNITY)
Admission: EM | Disposition: A | Discharge status: Hospice | Payer: Self-pay | Source: Home / Self Care | Attending: Internal Medicine

## 2017-12-06 ENCOUNTER — Inpatient Hospital Stay (HOSPITAL_COMMUNITY): Payer: Medicare Other | Admitting: Anesthesiology

## 2017-12-06 DIAGNOSIS — R451 Restlessness and agitation: Secondary | ICD-10-CM

## 2017-12-06 DIAGNOSIS — K3189 Other diseases of stomach and duodenum: Secondary | ICD-10-CM

## 2017-12-06 DIAGNOSIS — Z515 Encounter for palliative care: Secondary | ICD-10-CM

## 2017-12-06 DIAGNOSIS — Z7189 Other specified counseling: Secondary | ICD-10-CM

## 2017-12-06 HISTORY — PX: ESOPHAGOGASTRODUODENOSCOPY: SHX5428

## 2017-12-06 LAB — CBC
HEMATOCRIT: 36.7 % — AB (ref 39.0–52.0)
Hemoglobin: 12.2 g/dL — ABNORMAL LOW (ref 13.0–17.0)
MCH: 32.4 pg (ref 26.0–34.0)
MCHC: 33.2 g/dL (ref 30.0–36.0)
MCV: 97.6 fL (ref 78.0–100.0)
Platelets: 298 10*3/uL (ref 150–400)
RBC: 3.76 MIL/uL — ABNORMAL LOW (ref 4.22–5.81)
RDW: 14.9 % (ref 11.5–15.5)
WBC: 8.8 10*3/uL (ref 4.0–10.5)

## 2017-12-06 LAB — BASIC METABOLIC PANEL
ANION GAP: 14 (ref 5–15)
BUN: 14 mg/dL (ref 6–20)
CALCIUM: 9 mg/dL (ref 8.9–10.3)
CHLORIDE: 105 mmol/L (ref 101–111)
CO2: 20 mmol/L — AB (ref 22–32)
Creatinine, Ser: 1.15 mg/dL (ref 0.61–1.24)
GFR calc non Af Amer: 50 mL/min — ABNORMAL LOW (ref >60)
GFR, EST AFRICAN AMERICAN: 58 mL/min — AB (ref >60)
Glucose, Bld: 98 mg/dL (ref 65–99)
Potassium: 4 mmol/L (ref 3.5–5.1)
Sodium: 139 mmol/L (ref 135–145)

## 2017-12-06 LAB — GLUCOSE, CAPILLARY: GLUCOSE-CAPILLARY: 86 mg/dL (ref 65–99)

## 2017-12-06 LAB — MAGNESIUM: Magnesium: 1.4 mg/dL — ABNORMAL LOW (ref 1.7–2.4)

## 2017-12-06 SURGERY — EGD (ESOPHAGOGASTRODUODENOSCOPY)
Anesthesia: General

## 2017-12-06 MED ORDER — HALOPERIDOL LACTATE 5 MG/ML IJ SOLN: 2.0000 mg | Freq: Four times a day (QID) | INTRAMUSCULAR | Status: DC | PRN

## 2017-12-06 MED ORDER — LIDOCAINE HCL (CARDIAC) 20 MG/ML IV SOLN
INTRAVENOUS | Status: DC | PRN
  Administered 2017-12-06: 3 mL via INTRATRACHEAL

## 2017-12-06 MED ORDER — LACTATED RINGERS IV SOLN
INTRAVENOUS | Status: DC
  Administered 2017-12-06: 1000 mL via INTRAVENOUS

## 2017-12-06 MED ORDER — ONDANSETRON HCL 4 MG/2ML IJ SOLN
INTRAMUSCULAR | Status: DC | PRN
  Administered 2017-12-06: 4 mg via INTRAVENOUS

## 2017-12-06 MED ORDER — SUGAMMADEX SODIUM 200 MG/2ML IV SOLN
INTRAVENOUS | Status: DC | PRN
  Administered 2017-12-06: 200 mg via INTRAVENOUS

## 2017-12-06 MED ORDER — POLYVINYL ALCOHOL 1.4 % OP SOLN
1.0000 [drp] | Freq: Four times a day (QID) | OPHTHALMIC | Status: DC | PRN
  Filled 2017-12-06: qty 15

## 2017-12-06 MED ORDER — MAGNESIUM SULFATE 2 GM/50ML IV SOLN
2.0000 g | Freq: Once | INTRAVENOUS | Status: AC
  Administered 2017-12-06: 2 g via INTRAVENOUS
  Filled 2017-12-06: qty 50

## 2017-12-06 MED ORDER — ROCURONIUM BROMIDE 100 MG/10ML IV SOLN
INTRAVENOUS | Status: DC | PRN
  Administered 2017-12-06: 30 mg via INTRAVENOUS

## 2017-12-06 MED ORDER — ETOMIDATE 2 MG/ML IV SOLN
INTRAVENOUS | Status: DC | PRN
  Administered 2017-12-06: 20 mg via INTRAVENOUS

## 2017-12-06 NOTE — Progress Notes (Signed)
Daily Progress Note   Patient Name: Mario May       Date: 12/06/2017 DOB: 07/19/1915  Age: 84 y.o. MRN#: [id number] Attending Physician: Lavina Hamman, MD Primary Care Physician: Dione Housekeeper, MD Admit Date: 11/29/2017  Reason for Consultation/Follow-up: Establishing goals of care, Non pain symptom management, Pain control and Psychosocial/spiritual support  Subjective: Patient in bed resting. No signs of distress or agitation. Unable to engage in conversation. Family at bedside Jenny Reichmann, daughter and Adell, wife). Met with family at bedside post EGD and discussed their understanding of current status and goals of care. At this time wife and daughter both verbalized their desire for Mario May to be kept comfortable until he was able to transfer to a residential hospice facility. Family verbalized understanding and agreement to shift all care to full comfort, knowing this would include discontinuing medications not symptom focused, IVF, antibiotics etc. Family verbalized their goal for him to pass away as comfortable as possible and without suffering.   Length of Stay: 7  Current Medications: Scheduled Meds:  . pantoprazole (PROTONIX) IV  40 mg Intravenous Q24H    Continuous Infusions: . 0.45 % NaCl with KCl 20 mEq / L 50 mL/hr at 12/05/17 1853  . methocarbamol (ROBAXIN)  IV 500 mg (12/06/17 0450)    PRN Meds: acetaminophen, benzocaine, glycopyrrolate, haloperidol lactate, HYDROmorphone (DILAUDID) injection, LORazepam, methocarbamol (ROBAXIN)  IV, ondansetron (ZOFRAN) IV, phenol  Physical Exam  Constitutional: He is sleeping.  Ill in appearance male.   Cardiovascular: Normal rate, regular rhythm and intact distal pulses.  Pulmonary/Chest: Effort normal. He has decreased  breath sounds.  Abdominal: Soft.  nondistended   Musculoskeletal:  Generalized weakness   Nursing note and vitals reviewed.           Vital Signs: BP (!) 140/93 (BP Location: Left Arm)   Pulse (!) 54   Temp 98.1 F (36.7 C)   Resp (!) 24   Ht _0  (1.803 m)   Wt 68.5 kg (151 lb 0.2 oz)   SpO2 97%   BMI 21.06 kg/m  SpO2: SpO2: 97 % O2 Device: O2 Device: Room Air O2 Flow Rate: O2 Flow Rate (L/min): 6 L/min  Intake/output summary:   Intake/Output Summary (Last 24 hours) at 12/06/2017 1536 Last data filed at 12/06/2017 1502 Gross per 24 hour  Intake 1715 ml  Output 350 ml  Net 1365 ml   LBM: Last BM Date: 12/05/17 Baseline Weight: Weight: 68 kg (150 lb) Most recent weight: Weight: 68.5 kg (151 lb 0.2 oz)       Palliative Assessment/Data:PPS 20%    Flowsheet Rows     Most Recent Value  Intake Tab  Referral Department  Hospitalist  Unit at Time of Referral  Med/Surg Unit  Palliative Care Primary Diagnosis  Cancer  Date Notified  11/30/17  Palliative Care Type  Return patient Palliative Care  Reason for referral  Clarify Goals of Care  Date of Admission  11/29/17  Date first seen by Palliative Care  12/01/17  # of days Palliative referral response time  1 Day(s)  # of days IP prior to Palliative referral  1  Clinical Assessment  Psychosocial & Spiritual Assessment  Palliative Care Outcomes      Patient Active Problem List   Diagnosis Date Noted  . Gastric distention   . Agitation   . Abnormal CT of the abdomen   . Pancreatic mass   . Palliative care by specialist   . Goals of care, counseling/discussion   . Gastric outlet obstruction 11/29/2017  . Stroke (cerebrum) ([place]) 10/28/2017  . CAP (community acquired pneumonia) 08/21/2017  . Neck pain 08/21/2017  . Bradycardia 01/23/2017  . Atrial fibrillation with slow ventricular response ([place]) 01/23/2017  . Dehydration 08/19/2016  . Elevated troponin 08/18/2016  . AKI (acute kidney injury) ([place])  08/18/2016  . Difficulty in walking, not elsewhere classified   . Gait instability   . Left leg cellulitis 06/05/2016  . Orthostatic hypotension 06/04/2016  . 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 Lumbar foraminal stenosis 06/04/2016  . DJD (degenerative joint disease), multiple sites 06/04/2016  . Fever 06/04/2016  . Leukocytosis 06/04/2016  . Stasis dermatitis of both legs 07/01/2013  . Essential hypertension 03/15/2013  . Hypothyroidism, adult 03/18/2009  . CONSTIPATION 03/18/2009  . CHANGE IN BOWELS 03/18/2009    Palliative Care Assessment & Plan   Patient Profile: 84 y.o.maleadmitted on 3/12/2019with abdominal pain, distention, nausea, and vomiting. He has a past medical history significant forknown pancreatic mass, prior history of small bowel obstruction, atrial fibrillation not on systemic anticoagulation, hypertension, GERD, andhypothyroidism.Patient has been unable to tolerate solid intake over the last few weeks, and it worsened to the point at whichhe wasunable to keep down any solids or liquids. He notes multiple episodes of dark brown emesis prior to presentation to the ED. Patient's known pancreatic mass at the uncinate process was first identified in April 2018.The mass is being followed expectantly. CT A/P was obtained in the ED demonstrating significant gastric distention, likely related to narrowing within the second portion of the duodenum.Patient is receiving hydration and a NG tube was placed on second attempt.General surgery was consulted in the ED; they feel the patient is not a surgical candidate at this time. Palliative Medicine has been consulted for goals of care.  Assessment: Active Problems: Gastric outlet obstruction Pancreatic mass Palliative care by specialist DNR (do not resuscitate) discussion History of hypothyroidism History of GERD Lactic acidosis    Recommendations/Plan:  DNR/DNI  FULL  COMFORT   Continue withDilaudid IV PRN for pain and dyspnea  Continue with Robinul for secretions  Agree with Haldol and Ativan as ordered for agitation/anxiety  Comfort measures, patient may have small amounts of ice chips if tolerable with known high risk of aspiration.   CSW consult for residential hospice placement. (Family  lives in [place] and requesting placement in [place]). Per [facility] patient is #2 on list.   Palliative medicine team will continue to provide support to patient, family, and medical team during hospitalization.   Goals of Care and Additional Recommendations:  Limitations on Scope of Treatment: Full Comfort Care  Code Status: DNR/DNI   Code Status Orders  (From admission, onward)        Start     Ordered   12/02/17 1101  Do not attempt resuscitation (DNR)  Continuous    Question Answer Comment  In the event of cardiac or respiratory ARREST Do not call a "code blue"   In the event of cardiac or respiratory ARREST Do not perform Intubation, CPR, defibrillation or ACLS   In the event of cardiac or respiratory ARREST Use medication by any route, position, wound care, and other measures to relive pain and suffering. May use oxygen, suction and manual treatment of airway obstruction as needed for comfort.      12/02/17 1100    Code Status History    Date Active Date Inactive Code Status Order ID Comments User Context   11/29/2017 23:58 12/02/2017 11:00 Full Code [id number]  Bennie Pierini, MD ED   10/28/2017 12:30 11/01/2017 01:32 Full Code [id number]  Greta Doom, MD ED   08/21/2017 18:29 08/23/2017 15:58 Full Code [id number]  Karmen Bongo, MD Inpatient   01/23/2017 08:10 01/23/2017 16:41 Partial Code [id number]  Oswald Hillock, MD Inpatient   08/18/2016 23:17 08/19/2016 20:05 Full Code [id number]  Phillips Grout, MD Inpatient   06/04/2016 18:18 06/06/2016 14:46 Full Code [id number]  Rexene Alberts, MD ED   04/06/2013 18:11 04/07/2013  15:58 Full Code [id number]  Jonetta Osgood, MD Inpatient   03/15/2013 02:27 03/19/2013 15:46 Full Code [id number]  Etta Quill., DO Inpatient       Prognosis:   Hours - Days in the setting of SBO (not a surgical candidate), known pancreatic mass, malnutrition, poor po intake, immobility, AMS  Discharge Planning:  Hospice facility-family requesting possible placement in [place] if available.   Care plan was discussed with patient's family, bedside RN, and Dr. Posey Pronto text paged.   Thank you for allowing the Palliative Medicine Team to assist in the care of this patient.   Total Time 55 min.  Prolonged Time Billed No      Greater than 50%  of this time was spent counseling and coordinating care related to the above assessment and plan.  Alda Lea, AGNP-BC Palliative Medicine Team  Phone: [phone] Fax: [phone]  Discussed with NP and agree with above findings.  Ihor Dow, FNP-C Palliative Medicine Team  Phone: [phone] Fax: [phone]   Please contact Palliative Medicine Team phone at [phone] for questions and concerns.

## 2017-12-06 NOTE — Anesthesia Preprocedure Evaluation (Addendum)
Anesthesia Evaluation  Patient identified by MRN, date of birth, ID band Patient confused    Reviewed: Allergy & Precautions, NPO status , Patient's Chart, lab work & pertinent test results  Airway Mallampati: II  TM Distance: >3 FB Neck ROM: Full    Dental  (+) Teeth Intact, Dental Advisory Given, Poor Dentition   Pulmonary former smoker,     + decreased breath sounds      Cardiovascular hypertension, Normal cardiovascular exam+ dysrhythmias Atrial Fibrillation  Rhythm:Regular Rate:Normal     Neuro/Psych CVA negative psych ROS   GI/Hepatic GERD  Medicated,Pancreatic uncinate mass Gastric outlet obstruction   Endo/Other  Hypothyroidism   Renal/GU negative Renal ROS     Musculoskeletal negative musculoskeletal ROS (+) Arthritis , Rheumatoid disorders,    Abdominal   Peds  Hematology  (+) Blood dyscrasia, anemia ,   Anesthesia Other Findings Day of surgery medications reviewed with the patient.  Reproductive/Obstetrics                            Anesthesia Physical Anesthesia Plan  ASA: IV  Anesthesia Plan: General   Post-op Pain Management:    Induction: Intravenous and Rapid sequence  PONV Risk Score and Plan: 2  Airway Management Planned: Oral ETT  Additional Equipment:   Intra-op Plan:   Post-operative Plan: Possible Post-op intubation/ventilation  Informed Consent: I have reviewed the patients History and Physical, chart, labs and discussed the procedure including the risks, benefits and alternatives for the proposed anesthesia with the patient or authorized representative who has indicated his/her understanding and acceptance.   Dental advisory given  Plan Discussed with: CRNA  Anesthesia Plan Comments: (DNR: family OK with post-op intubation. Declines CPR, AED use intra-operatively.  Patient's daughter acting as POA.)       Anesthesia Quick Evaluation

## 2017-12-06 NOTE — Progress Notes (Signed)
          Daily Rounding Note  12/06/2017, 8:57 AM  LOS: 7 days   SUBJECTIVE:   Chief complaint: restless, agitated at times, currently resting.   1 BM recorded yesterday, not described.     OBJECTIVE:         Vital signs in last 24 hours:    Temp:  [97.6 F (36.4 C)-98 F (36.7 C)] 97.6 F (36.4 C) (03/19 0646) Pulse Rate:  [55-90] 90 (03/19 0646) Resp:  [18-20] 20 (03/19 0646) BP: (140-155)/(66-88) 140/88 (03/19 0646) SpO2:  [95 %-99 %] 98 % (03/19 0646) Last BM Date: 12/05/17 Filed Weights   12/02/17 0420 12/03/17 0500 12/05/17 0528  Weight: 148 lb 5.9 oz (67.3 kg) 155 lb 3.3 oz (70.4 kg) 151 lb 0.2 oz (68.5 kg)   General: resting quietly   Chest: Non-labored breathing Did not reexamine pt.    Intake/Output from previous day: 03/18 0701 - 03/19 0700 In: 1226.7 [I.V.:1141.7; NG/GT:30; IV Piggyback:55] Out: 950 [Urine:950]  Intake/Output this shift: No intake/output data recorded.  Lab Results: Recent Labs    12/06/17 0450  WBC 8.8  HGB 12.2*  HCT 36.7*  PLT 298   BMET Recent Labs    12/06/17 0450  NA 139  K 4.0  CL 105  CO2 20*  GLUCOSE 98  BUN 14  CREATININE 1.15  CALCIUM 9.0    Studies/Results: Dg Abd Portable 1v  Result Date: 12/04/2017 CLINICAL DATA:  Patient with history of obstruction. EXAM: PORTABLE ABDOMEN - 1 VIEW COMPARISON:  Abdominal radiograph 12/03/2017. FINDINGS: Enteric tube projects over the left upper quadrant. Residual oral contrast within the stomach. Small amount of contrast within the cecum and ascending colon. Gaseous distended loops of bowel throughout the abdomen. IMPRESSION: Persistent contrast within the stomach. Gaseous distended loops of bowel throughout the abdomen suggestive of ileus. Electronically Signed   By: Lovey Newcomer M.D.   On: 12/04/2017 12:06    Scheduled Meds: . pantoprazole (PROTONIX) IV  40 mg Intravenous Q24H   Continuous Infusions: . 0.45 % NaCl with  KCl 20 mEq / L 50 mL/hr at 12/05/17 1853  . magnesium sulfate 1 - 4 g bolus IVPB    . methocarbamol (ROBAXIN)  IV 500 mg (12/06/17 0450)   PRN Meds:.acetaminophen, benzocaine, glycopyrrolate, haloperidol lactate, HYDROmorphone (DILAUDID) injection, LORazepam, methocarbamol (ROBAXIN)  IV, ondansetron (ZOFRAN) IV, phenol   ASSESMENT:   *   GOO. ? 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 cancer vs ulcer.  Mass stable c/w 1 month ago.  LFTs normal.  NGT in place. GOO with retained contrast and ileus per KUBs.    On Q day IV Protonix.    *  Hyperglycemia.  No previous Dx DM.    *  Normocytic anemia.  Hgb 12.1 >> 10.4 >> 12.2 over last 7 days.   *  Hypomagnesemia.    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  On ASA 325 mg only but this is now on hold.       PLAN   *  Diagnostic EGD today ~ noon.      Mario May  12/06/2017, 8:57 AM Pager: [phone]

## 2017-12-06 NOTE — Op Note (Signed)
[facility] Patient Name: Mario May Procedure Date : 12/06/2017 MRN: [id number] Attending MD: Estill Cotta. Loletha Carrow , MD Date of Birth: 07-17-1915 CSN: [id number] Age: 84 Admit Type: Inpatient Procedure:                Upper GI endoscopy Indications:              Abnormal CT of the GI tract (showing pancreatic                            mass causing GOO), Persistent vomiting, Weight loss Providers:                Mallie Mussel L. Loletha Carrow, MD, Cleda Daub, RN, Laurena Spies, Technician Referring MD:              Medicines:                General Anesthesia Complications:            No immediate complications. Estimated Blood Loss:     Estimated blood loss: none. Procedure:                Pre-Anesthesia Assessment:                           - Prior to the procedure, a History and Physical                            was performed, and patient medications and                            allergies were reviewed. The patient's tolerance of                            previous anesthesia was also reviewed. The risks                            and benefits of the procedure and the sedation                            options and risks were discussed with the patient.                            All questions were answered, and informed consent                            was obtained. Anticoagulants: The patient has taken                            aspirin. It was decided not to withhold this                            medication prior to the procedure. ASA Grade  Assessment: III - A patient with severe systemic                            disease. After reviewing the risks and benefits,                            the patient was deemed in satisfactory condition to                            undergo the procedure.                           After obtaining informed consent, the endoscope was                            passed under direct  vision. Throughout the                            procedure, the patient's blood pressure, pulse, and                            oxygen saturations were monitored continuously. The                            Endoscope was introduced through the mouth, and                            advanced to the pylorus. The upper GI endoscopy was                            performed with difficulty due to presence of food                            and stricture. The patient tolerated the procedure                            well. Scope In: Scope Out: Findings:      The examined esophagus was normal except for the NGT in place (tip in       the stomach).      A large amount of solid food was found in the entire examined stomach.       The greatly limited visualization. It was very difficult to see the       pylorus. The stomach was also J-shaped from the chronic obstruction.      A severe stenosis was found at the pylorus. This was non-traversed       because it was 1-2 mm in diameter.      An examination of the duodenum was not performed.      The NGT was no longer providing benefit since the retained stomach       material was solid, so it was removed at the end of the procedure. Impression:               - Normal esophagus.                           -  A large amount of food (residue) in the stomach.                           - Gastric stenosis was found at the pylorus. This                            appears to be from a pancreatic neck mass.                           - No specimens collected.                           This is not amenable to stent placement. Moderate Sedation:      GETA Recommendation:           - Return patient to hospital ward for ongoing care.                           - NPO.                           - Given this patient's age, condition, and current                            findings, hospice is warranted. If that is done,                            hospice team may  elect to allow ice chips and                            water. The patient is at risk of aspiration even if                            kept strictly NPO.                           - Continue present medications. Procedure Code(s):        --- Professional ---                           [phone], 63, Esophagogastroduodenoscopy, flexible,                            transoral; diagnostic, including collection of                            specimen(s) by brushing or washing, when performed                            (separate procedure) Diagnosis Code(s):        --- Professional ---                           K31.1, Adult hypertrophic pyloric stenosis  R11.10, Vomiting, unspecified                           R63.4, Abnormal weight loss                           R93.3, Abnormal findings on diagnostic imaging of                            other parts of digestive tract CPT copyright 2016 American [employer]. All rights reserved. The codes documented in this report are preliminary and upon coder review may  be revised to meet current compliance requirements. [name] L. Loletha Carrow, MD 12/06/2017 12:54:00 PM This report has been signed electronically. Number of Addenda: 0

## 2017-12-06 NOTE — Anesthesia Procedure Notes (Signed)
Procedure Name: Intubation Date/Time: 12/06/2017 12:22 PM Performed by: Mariea Clonts, CRNA Pre-anesthesia Checklist: Patient identified, Emergency Drugs available, Suction available and Patient being monitored Patient Re-evaluated:Patient Re-evaluated prior to induction Oxygen Delivery Method: Circle System Utilized Preoxygenation: Pre-oxygenation with 100% oxygen Induction Type: IV induction Ventilation: Mask ventilation without difficulty Laryngoscope Size: Mac and 4 Grade View: Grade II Tube type: Oral Tube size: 7.5 mm Number of attempts: 1 Airway Equipment and Method: Stylet and Oral airway Placement Confirmation: ETT inserted through vocal cords under direct vision,  positive ETCO2 and breath sounds checked- equal and bilateral Tube secured with: Tape Dental Injury: Teeth and Oropharynx as per pre-operative assessment

## 2017-12-06 NOTE — Progress Notes (Signed)
Palliative Medicine RN Note: Rec'd a call from PMT NP that patient will be converted to comfort care and is being referred to inpatient hospice. Family requesting inpt at [facility] due to location and wife's advanced age & difficulty getting to other facilities. I called referral in and faxed facesheet, H&P and progress notes, order and MAR to the hospice.   Marjie Skiff Ayane Delancey, RN, BSN, [facility] Palliative Medicine Team 12/06/2017 4:03 PM Office [phone]

## 2017-12-06 NOTE — Interval H&P Note (Signed)
History and Physical Interval Note:  12/06/2017 12:14 PM  Mario May  has presented today for surgery, with the diagnosis of gastric outlet obstruction  The various methods of treatment have been discussed with the patient and family. After consideration of risks, benefits and other options for treatment, the patient has consented to  Procedure(s): ESOPHAGOGASTRODUODENOSCOPY (EGD) (N/A) as a surgical intervention .  The patient's history has been reviewed, patient examined, no change in status, stable for surgery.  I have reviewed the patient's chart and labs.  Questions were answered to the patient's satisfaction.     [name]

## 2017-12-06 NOTE — Progress Notes (Signed)
Patient taken down for endoscopy. Daughter and wife at bedside.

## 2017-12-06 NOTE — Social Work (Signed)
CSW aware that PMT has made referral to CSW for residential hospice.  PMT has also made referral to [facility]. Pt is on waiting list.   Mario May Mario May, [place] Work [phone]

## 2017-12-06 NOTE — Transfer of Care (Signed)
Immediate Anesthesia Transfer of Care Note  Patient: Mario May  Procedure(s) Performed: ESOPHAGOGASTRODUODENOSCOPY (EGD) (N/A )  Patient Location: PACU  Anesthesia Type:General  Level of Consciousness: awake, alert  and oriented  Airway & Oxygen Therapy: Patient Spontanous Breathing and Patient connected to face mask oxygen  Post-op Assessment: Report given to RN, Post -op Vital signs reviewed and stable and Patient moving all extremities X 4  Post vital signs: Reviewed and stable  Last Vitals:  Vitals:   12/06/17 1138 12/06/17 1255  BP: (!) 123/45 (!) 89/49  Pulse: 64 (!) 56  Resp: (!) 24 13  Temp: 36.7 C (!) (P) 36.3 C  SpO2: 98% 100%    Last Pain:  Vitals:   12/06/17 1138  TempSrc: Oral  PainSc:          Complications: No apparent anesthesia complications

## 2017-12-06 NOTE — Progress Notes (Signed)
[facility] PROGRESS NOTE  SHERYL TOWELL GYI:[id number] DOB: [birth year] DOA: 11/29/2017  PCP: Dione Housekeeper, MD  Brief History/Interval Summary: 84 year old Caucasian male with a past medical history significant for known pancreatic mass, prior history of small bowel obstruction, atrial fibrillation not on systemic anticoagulation, hypertension hypothyroidism presented with abdominal pain distention nausea and vomiting.  He has been unable to tolerate solid foods over the last several weeks.  Patient has lost weight.  Evaluation revealed significant gastric distention.  Narrowing of the second portion of duodenum.  General surgery was consulted.  They do not feel that the patient is a candidate for surgical intervention.  Reason for Visit: Gastric outlet obstruction due to pancreatic mass  Consultants: General surgery.  Gastroenterology  Procedures: None yet  Antibiotics: None  Subjective/Interval History: More confused this morning, agitated.  No acute nausea or vomiting.  ROS: Denies any chest pain, abdominal pain, nausea  Objective:  Vital Signs  Vitals:   12/06/17 1325 12/06/17 1330 12/06/17 1333 12/06/17 1430  BP: 128/70   (!) 140/93  Pulse: (!) 55 61 (!) 59 (!) 54  Resp: 18 19 (!) 28 (!) 24  Temp:   (!) 97.3 F (36.3 C) 98.1 F (36.7 C)  TempSrc:      SpO2: 99% 99% 98% 97%  Weight:      Height:        Intake/Output Summary (Last 24 hours) at 12/06/2017 1709 Last data filed at 12/06/2017 1502 Gross per 24 hour  Intake 1715 ml  Output 350 ml  Net 1365 ml   Filed Weights   12/02/17 0420 12/03/17 0500 12/05/17 0528  Weight: 67.3 kg (148 lb 5.9 oz) 70.4 kg (155 lb 3.3 oz) 68.5 kg (151 lb 0.2 oz)    General appearance: alert, cooperative, appears stated age and no distress Head: Normocephalic, without obvious abnormality, atraumatic Resp: clear to auscultation bilaterally Cardio: regular rate and rhythm, S1, S2 normal, no murmur, click, rub or  gallop GI: Abdomen is soft.  Mildly tender in the epigastric area without any rebound rigidity or guarding.  No obvious masses organomegaly appreciated.  Bowel sounds sluggish. Extremities: extremities normal, atraumatic, no cyanosis or edema Pulses: 2+ and symmetric Neurologic: Grossly normal  Lab Results:  Data Reviewed: I have personally reviewed following labs and imaging studies  CBC: Recent Labs  Lab 11/29/17 1800 11/30/17 0500 12/01/17 0612 12/06/17 0450  WBC 7.7 10.5 7.9 8.8  NEUTROABS 6.6  --   --   --   HGB 12.5* 12.1* 10.4* 12.2*  HCT 38.8* 36.3* 31.9* 36.7*  MCV 98.7 98.4 98.2 97.6  PLT 237 202 178 350    Basic Metabolic Panel: Recent Labs  Lab 11/30/17 0007 11/30/17 0500 12/01/17 0612 12/02/17 0803 12/03/17 0740 12/06/17 0450  NA  --  135 135 137 131* 139  K  --  4.0 3.6 3.4* 4.8 4.0  CL  --  103 105 107 103 105  CO2  --  21* 21* 18* 22 20*  GLUCOSE  --  97 86 73 346* 98  BUN  --  17 16 14  5* 14  CREATININE  --  1.08 0.94 0.92 0.73 1.15  CALCIUM  --  8.9 8.4* 8.3* 7.7* 9.0  MG 1.9  --   --   --   --  1.4*  PHOS 3.5  --   --   --   --   --     GFR: Estimated Creatinine Clearance: 33.1 mL/min (by C-G formula  based on SCr of 1.15 mg/dL).  Liver Function Tests: Recent Labs  Lab 11/29/17 1800 11/30/17 0500 12/01/17 0612  AST 41 35 24  ALT 11* 10* 8*  ALKPHOS 155* 132* 110  BILITOT 0.9 1.1 1.6*  PROT 7.2 6.4* 5.5*  ALBUMIN 3.6 3.1* 2.6*    Recent Labs  Lab 11/29/17 1800  LIPASE 32   No results for input(s): AMMONIA in the last 168 hours.  Coagulation Profile: Recent Labs  Lab 11/30/17 0007  INR 1.07    Cardiac Enzymes: No results for input(s): CKTOTAL, CKMB, CKMBINDEX, TROPONINI in the last 168 hours.  BNP (last 3 results) No results for input(s): PROBNP in the last 8760 hours.  HbA1C: No results for input(s): HGBA1C in the last 72 hours.  CBG: Recent Labs  Lab 12/03/17 0624 12/03/17 0749 12/04/17 0801 12/05/17 0741  12/06/17 0738  GLUCAP 105* 104* 136* 88 86    Lipid Profile: No results for input(s): CHOL, HDL, LDLCALC, TRIG, CHOLHDL, LDLDIRECT in the last 72 hours.  Thyroid Function Tests: No results for input(s): TSH, T4TOTAL, FREET4, T3FREE, THYROIDAB in the last 72 hours.  Anemia Panel: No results for input(s): VITAMINB12, FOLATE, FERRITIN, TIBC, IRON, RETICCTPCT in the last 72 hours.  No results found for this or any previous visit (from the past 240 hour(s)).    Radiology Studies: No results found.   Medications:  Scheduled: . pantoprazole (PROTONIX) IV  40 mg Intravenous Q24H   Continuous: . methocarbamol (ROBAXIN)  IV 500 mg (12/06/17 0450)   HEN:IDPOEUMPNTIRW, glycopyrrolate, haloperidol lactate, HYDROmorphone (DILAUDID) injection, LORazepam, methocarbamol (ROBAXIN)  IV, ondansetron (ZOFRAN) IV, phenol, polyvinyl alcohol  Assessment/Plan:  Active Problems:   Gastric outlet obstruction   Pancreatic mass   Palliative care by specialist   Goals of care, counseling/discussion   Gastric distention   Agitation   Abnormal CT of the abdomen  Gastric outlet obstruction secondary to mechanical compression by pancreatic mass Seen by general surgery.  Not thought to be of good candidate for surgical intervention.   Underwent upper GI series. Palliative care, GI as well as general surgery discussed with family today. Goal is for hospice with comfort care protocol. Patient underwent endoscopy which showed solid food residue, 6 very tight pyloric stricture and GI was unable to place duodenal stent. Currently recommends n.p.o. with comfort care approach only.  Lactic acidosis Etiology unclear but could be due to nausea vomiting and dehydration.  Lactic acid levels have improved significantly.  Continue hydration.  History of hypothyroidism Currently receiving levothyroxine intravenously.  History of GERD Questionable reports of coffee-ground emesis.  Hemoglobin remains stable.   Continue IV PPI.  Transition to full comfort.  Throat pain. Use Orajel.  Acute metabolic encephalopathy. Likely toxic from medication Morphine changed to Dilaudid. Continue PRN Haldol.  Neck pain PRN robaxin  DVT Prophylaxis: SCDs     Code Status: Full code Family Communication: Family at bedside Disposition Plan: Continue nasogastric suction and perform endoscopic stent placement once able.    LOS: 7 days   Berle Mull 12/06/2017, 5:09 PM  If 7PM-7AM, please contact night-coverage at [email or web address], password [facility]

## 2017-12-07 ENCOUNTER — Encounter (HOSPITAL_COMMUNITY): Payer: Self-pay | Admitting: Gastroenterology

## 2017-12-07 DIAGNOSIS — Z4659 Encounter for fitting and adjustment of other gastrointestinal appliance and device: Secondary | ICD-10-CM

## 2017-12-07 LAB — CANCER ANTIGEN 19-9: CA 19-9: 750 U/mL — ABNORMAL HIGH (ref 0–35)

## 2017-12-07 NOTE — Progress Notes (Signed)
Jac Canavan to be D/C'd  per MD order. Discussed with the patient and all questions fully answered.  VSS, Skin clean, dry and intact without evidence of skin break down, no evidence of skin tears noted.  IV catheter discontinued intact. Site without signs and symptoms of complications. Dressing and pressure applied.  Patient being transported via [place] to [facility]

## 2017-12-07 NOTE — Anesthesia Postprocedure Evaluation (Signed)
Anesthesia Post Note  Patient: Mario May  Procedure(s) Performed: ESOPHAGOGASTRODUODENOSCOPY (EGD) (N/A )     Patient location during evaluation: PACU Anesthesia Type: General Level of consciousness: awake and alert Pain management: pain level controlled Vital Signs Assessment: post-procedure vital signs reviewed and stable Respiratory status: spontaneous breathing, nonlabored ventilation, respiratory function stable and patient connected to nasal cannula oxygen Cardiovascular status: blood pressure returned to baseline and stable Postop Assessment: no apparent nausea or vomiting Anesthetic complications: no    Last Vitals:  Vitals:   12/06/17 1430 12/07/17 0609  BP: (!) 140/93 (!) 153/76  Pulse: (!) 54 61  Resp: (!) 24 20  Temp: 36.7 C 36.7 C  SpO2: 97% 100%    Last Pain:  Vitals:   12/07/17 0609  TempSrc:   PainSc: 0-No pain                 Catalina Gravel

## 2017-12-07 NOTE — Progress Notes (Signed)
Report called/given to Sigmund Hazel of [facility] [place].

## 2017-12-07 NOTE — Social Work (Signed)
Clinical Social Worker facilitated patient discharge including contacting patient family and facility to confirm patient discharge plans.  Clinical information faxed to facility and family agreeable with plan.  CSW arranged ambulance transport via [facility] to [facility] [place].   RN to call [phone] with report prior to discharge.  Clinical Social Worker will sign off for now as social work intervention is no longer needed. Please consult [place] again if new need arises.  Alexander Mt, [place] Social Worker [phone]

## 2017-12-07 NOTE — Discharge Summary (Signed)
[facility] Discharge Summary   Patient: Mario May:[id number]   PCP: Dione Housekeeper, MD DOB: 1914-12-10   Date of admission: 11/29/2017   Date of discharge:  12/07/2017    Discharge Diagnoses:  Active Problems:   Gastric outlet obstruction   Pancreatic mass   Palliative care by specialist   Goals of care, counseling/discussion   Gastric distention   Agitation   Abnormal CT of the abdomen   Admitted From: home Disposition: Hospice inpatient  Recommendations for Outpatient Follow-up:  1. Establish care with inpatient hospice facility at [facility].  Diet recommendation: NPO, can be advanced to ice chips if needed for comfort  Activity: The patient is advised to gradually reintroduce usual activities.  Discharge Condition: stable  Code Status: DNR DNI  History of present illness: As per the H and P dictated on admission, "Mario May is a 84 y.o. male with medical history significant for known pancreatic mass, prior history of small bowel obstruction, A. fib not on systemic anticoagulation, hypertension, hypothyroidism and GERD who presents to the ED from home with left-sided abdominal pain, worsening abdominal distention and nausea with vomiting.  Patient has been unable to tolerate solid intake over the last few weeks, and it worsened to the point at which she is unable to keep down any solids or liquids.  He notes multiple episodes of dark brown emesis prior to presentation to the ED.  Patient's known pancreatic mass at the uncinate process was first identified in April 2018.  The mass is being followed expectantly.  CT A/P was obtained in the ED demonstrating significant gastric distention, likely related to narrowing within the second portion of the duodenum."  Hospital Course:  Summary of his active problems in the hospital is as following.  Gastric outlet obstruction secondary to mechanical compression by pancreatic cancer Seen by general surgery.   Not thought to be of good candidate for surgical intervention.   Underwent upper GI series. Palliative care, GI as well as general surgery discussed with family. Goal is for hospice with comfort care protocol. Patient underwent endoscopy which showed solid food residue, very tight pyloric stricture and GI was unable to place duodenal stent. Currently recommends n.p.o. with comfort care approach only. Inpatient hospice was arranged and patient's care will be transferred to [facility]  Lactic acidosis Etiology unclear but could be due to nausea vomiting and dehydration. Resolved    History of hypothyroidism Now comfort care  History of GERD Questionable reports of coffee-ground emesis.  Hemoglobin remains stable.    No active bleeders seen on the EGD. Now comfort care  Throat pain. Resolved with Orajel.  Acute metabolic encephalopathy. Likely toxic from medication Morphine changed to Dilaudid. Continue PRN Haldol.  Neck pain PRN robaxin  On the day of the discharge the patient's symptoms were well controlled, and no other acute medical condition were reported by patient. the patient was felt safe to be discharge at [facility] with inpatient hospice.  Procedures and Results:  EGD   Consultations:  Gastroenterology  General surgery   Palliative care   DISCHARGE MEDICATION: Allergies as of 12/07/2017      Reactions   Codeine Other (See Comments)   Passes out   Grapefruit Concentrate Other (See Comments)   unknown   Other Other (See Comments)   Potatoes, green beans, corn - unknown   Pineapple Other (See Comments)   unknown      Medication List  STOP taking these medications   acetaminophen 650 MG CR tablet Commonly known as:  TYLENOL   allopurinol 100 MG tablet Commonly known as:  ZYLOPRIM   aspirin 325 MG tablet   dicyclomine 20 MG tablet Commonly known as:  BENTYL   finasteride 5 MG  tablet Commonly known as:  PROSCAR   furosemide 20 MG tablet Commonly known as:  LASIX   GAS-X PO   isosorbide dinitrate 10 MG tablet Commonly known as:  ISORDIL   levothyroxine 25 MCG tablet Commonly known as:  SYNTHROID, LEVOTHROID   levothyroxine 75 MCG tablet Commonly known as:  SYNTHROID, LEVOTHROID   meclizine 25 MG tablet Commonly known as:  ANTIVERT   metoCLOPramide 5 MG tablet Commonly known as:  REGLAN   ondansetron 4 MG tablet Commonly known as:  ZOFRAN   pantoprazole 40 MG tablet Commonly known as:  PROTONIX   polyethylene glycol packet Commonly known as:  MIRALAX / GLYCOLAX   pravastatin 40 MG tablet Commonly known as:  PRAVACHOL      Allergies  Allergen Reactions  . Codeine Other (See Comments)    Passes out  . Grapefruit Concentrate Other (See Comments)    unknown  . Other Other (See Comments)    Potatoes, green beans, corn - unknown  . Pineapple Other (See Comments)    unknown   Discharge Instructions    Diet NPO time specified   Complete by:  As directed    Ice chips   Complete by:  As directed    Increase activity slowly   Complete by:  As directed      Discharge Exam: Filed Weights   12/02/17 0420 12/03/17 0500 12/05/17 0528  Weight: 67.3 kg (148 lb 5.9 oz) 70.4 kg (155 lb 3.3 oz) 68.5 kg (151 lb 0.2 oz)   Vitals:   12/06/17 1430 12/07/17 0609  BP: (!) 140/93 (!) 153/76  Pulse: (!) 54 61  Resp: (!) 24 20  Temp: 98.1 F (36.7 C) 98 F (36.7 C)  SpO2: 97% 100%   General: Appear in mild distress, no Rash; Oral Mucosa moist. Cardiovascular: S1 and S2 Present, no Murmur, difficult to assess  JVD Respiratory: Bilateral Air entry present and Clear to Auscultation, no Crackles, no wheezes Abdomen: Bowel Sound present, Extremities: no Pedal edema,   The results of significant diagnostics from this hospitalization (including imaging, microbiology, ancillary and laboratory) are listed below for reference.    Significant  Diagnostic Studies: Dg Abd 1 View  Result Date: 12/03/2017 CLINICAL DATA:  NG tube placement EXAM: ABDOMEN - 1 VIEW COMPARISON:  12/03/2017 600 hours FINDINGS: NG tube has been placed. The tip is in the fundus of the stomach. There is barium in the stomach and colon. Colon and stomach are dilated. IMPRESSION: NG tube tip is in the fundus of the stomach. Electronically Signed   By: Marybelle Killings M.D.   On: 12/03/2017 11:52   Ct Abdomen Pelvis W Contrast  Result Date: 11/29/2017 CLINICAL DATA:  Left upper quadrant pain EXAM: CT ABDOMEN AND PELVIS WITH CONTRAST TECHNIQUE: Multidetector CT imaging of the abdomen and pelvis was performed using the standard protocol following bolus administration of intravenous contrast. CONTRAST:  142mL ISOVUE-300 IOPAMIDOL (ISOVUE-300) INJECTION 61% COMPARISON:  10/05/2017 FINDINGS: Lower chest: Mild dependent atelectatic changes are noted bilaterally. No focal infiltrate is seen. Hepatobiliary: Gallbladder is well distended although no inflammatory changes are seen. The liver is unremarkable. Pancreas: Body and tail of pancreas are within normal limits. Hypoattenuating mass lesion is  again seen in the region of the uncinate process stable from the prior study. Spleen: Normal in size without focal abnormality. Adrenals/Urinary Tract: The adrenal glands are within normal limits. Renal cystic change is noted stable from the prior exam. No obstructive changes are seen. The bladder is well distended. Stomach/Bowel: The stomach is significantly distended with food stuffs with evidence of fluid within the distal esophagus likely related to reflux. Some mild narrowing is noted in the second portion of the duodenum in the region of the known pancreatic mass. Some local invasion cannot be totally excluded. This is likely the etiology of the significantly distended stomach. Vascular/Lymphatic: Aortic atherosclerosis. No enlarged abdominal or pelvic lymph nodes. Reproductive: Prostate is  unremarkable. Other: No abdominal wall hernia or abnormality. No abdominopelvic ascites. Musculoskeletal: Degenerative changes of the lumbar spine are noted. No acute bony abnormality is seen. IMPRESSION: Significantly distended stomach likely related to some narrowing in the second portion of the duodenum. This may be related to the patient's known pancreatic lesion. 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 Chronic changes as described above. Electronically Signed   By: Inez Catalina M.D.   On: 11/29/2017 21:08   Dg Abdomen Acute W/chest  Result Date: 11/29/2017 CLINICAL DATA:  One 84 year old male with abdominal pain. EXAM: DG ABDOMEN ACUTE W/ 1V CHEST COMPARISON:  Abdominal CT dated 10/05/2017 FINDINGS: Left lung base atelectatic changes. The right lung is clear. No large pleural effusion. There is no pneumothorax. Mild cardiomegaly. Atherosclerotic calcification of the aortic arch. There is severe gaseous distention of the stomach which may be related to gastroparesis or represent gastric outlet obstruction caused by pancreatic mass seen on the prior CT. Further evaluation with CT of the abdomen pelvis recommended. There is air in the colon. No definite free air identified. Bubbly lucencies noted over the stomach may be mixed with gastric content, however gastric pneumatosis is not excluded. Clinical correlation is recommended. There is paucity of small bowel air. There is degenerative changes of the spine. No acute osseous pathology. IMPRESSION: 1. No acute cardiopulmonary process. 2. Severe gaseous distention of the stomach may represent gastroparesis or gastric outlet obstruction caused by pancreatic mass. Further evaluation with CT of the abdomen pelvis recommended. 3. Bubbly lucencies over the stomach may represent air mixed with gastric content or pneumatosis. No definite free air identified. Electronically Signed   By: Anner Crete M.D.   On: 11/29/2017 18:37   Dg Abd  Portable 1v  Result Date: 12/04/2017 CLINICAL DATA:  Patient with history of obstruction. EXAM: PORTABLE ABDOMEN - 1 VIEW COMPARISON:  Abdominal radiograph 12/03/2017. FINDINGS: Enteric tube projects over the left upper quadrant. Residual oral contrast within the stomach. Small amount of contrast within the cecum and ascending colon. Gaseous distended loops of bowel throughout the abdomen. IMPRESSION: Persistent contrast within the stomach. Gaseous distended loops of bowel throughout the abdomen suggestive of ileus. Electronically Signed   By: Lovey Newcomer M.D.   On: 12/04/2017 12:06   Dg Abd Portable 1v  Result Date: 12/03/2017 CLINICAL DATA:  Gastric outlet obstruction. EXAM: PORTABLE ABDOMEN - 1 VIEW COMPARISON:  Abdominal x-ray from yesterday. FINDINGS: Unchanged positioning of the nasogastric tube with the tip in the proximal stomach. Large amount of residual contrast within the stomach is grossly unchanged. Air-filled colon is similar to prior study. Borderline small bowel dilatation, also unchanged. IMPRESSION: 1. Unchanged nasogastric tube within the proximal stomach, with evidence of gastric outlet obstruction. 2. Unchanged ileus. Electronically Signed   By:  Titus Dubin M.D.   On: 12/03/2017 09:00   Dg Abd Portable 1v  Result Date: 12/02/2017 CLINICAL DATA:  Encounter for nasogastric tube placement. EXAM: PORTABLE ABDOMEN - 1 VIEW COMPARISON:  Radiograph of December 02, 2017. FINDINGS: Distal tip of nasogastric tube is seen in proximal stomach. Large amount of residual contrast is noted in the stomach. Mild gastric distention is noted. Air-filled colon is noted. Mild bowel dilatation is noted suggesting ileus. IMPRESSION: Distal tip of nasogastric tube is seen in proximal stomach. Probable ileus is noted. Electronically Signed   By: Marijo Conception, M.D.   On: 12/02/2017 17:06   Dg Abd Portable 1v  Result Date: 12/02/2017 CLINICAL DATA:  Nasogastric tube placement EXAM: PORTABLE ABDOMEN - 1  VIEW COMPARISON:  December 01, 2017 FINDINGS: Nasogastric tube tip and side port are in the stomach. There is contrast in the stomach and portion of the colon. There is moderate generalized bowel dilatation. No free air. There is atelectatic change in the left lung base. IMPRESSION: Nasogastric tube tip and side port in stomach. Suspect a degree of underlying ileus. No free air. Electronically Signed   By: Lowella Grip III M.D.   On: 12/02/2017 15:08   Dg Abd Portable 1 View  Result Date: 11/30/2017 CLINICAL DATA:  One 84 year old male with distention of the stomach and a degree of gastric outlet obstruction. Status post NG tube placement. EXAM: PORTABLE ABDOMEN - 1 VIEW COMPARISON:  Abdominal CT dated 11/29/2017. FINDINGS: There has been interval placement of an enteric tube with tip and side-port over the stomach. There is continuous distention of the stomach. No free air identified. No acute cardiopulmonary process. IMPRESSION: Interval placement of an enteric tube with tip and side-port over the stomach. Electronically Signed   By: Anner Crete M.D.   On: 11/30/2017 00:23   Dg Duanne Limerick  W/kub  Result Date: 12/01/2017 CLINICAL DATA:  Gastric outlet obstruction EXAM: UPPER GI SERIES WITH KUB TECHNIQUE: After obtaining a scout radiograph a routine upper GI series was performed using thin barium FLUOROSCOPY TIME:  Fluoroscopy Time:  2 minutes Radiation Exposure Index (if provided by the fluoroscopic device): Number of Acquired Spot Images: 0 COMPARISON:  CT 11/29/2017 FINDINGS: Scout film demonstrates a NG tube coiled in the fundus of the stomach. There is gaseous distention of bowel diffusely. Thin barium was injected through the NG tube into the stomach. This demonstrates a distended stomach with a large amount of retained food material/debris. Only a trickle of contrast passed through the pylorus after 10-15 minutes on the table. Spontaneous reflux was noted into the upper thoracic esophagus during  the study. The patient was sent to his room and KUB is or obtained for 2-3 hours. Final KUB demonstrates continued large volume contrast and debris within the stomach with distention of the stomach. Very small amount of contrast has passed into the small bowel. IMPRESSION: Distention of the stomach with large amount of retained food material in the stomach. Only a very small amount of contrast passes through the pylorus in 2-3 hours. Findings compatible with high grade gastric outlet obstruction. Spontaneous gastroesophageal reflux. Electronically Signed   By: Rolm Baptise M.D.   On: 12/01/2017 11:15    Microbiology: No results found for this or any previous visit (from the past 240 hour(s)).   Labs: CBC: Recent Labs  Lab 12/01/17 0612 12/06/17 0450  WBC 7.9 8.8  HGB 10.4* 12.2*  HCT 31.9* 36.7*  MCV 98.2 97.6  PLT 178  735   Basic Metabolic Panel: Recent Labs  Lab 12/01/17 0612 12/02/17 0803 12/03/17 0740 12/06/17 0450  NA 135 137 131* 139  K 3.6 3.4* 4.8 4.0  CL 105 107 103 105  CO2 21* 18* 22 20*  GLUCOSE 86 73 346* 98  BUN 16 14 5* 14  CREATININE 0.94 0.92 0.73 1.15  CALCIUM 8.4* 8.3* 7.7* 9.0  MG  --   --   --  1.4*   Liver Function Tests: Recent Labs  Lab 12/01/17 0612  AST 24  ALT 8*  ALKPHOS 110  BILITOT 1.6*  PROT 5.5*  ALBUMIN 2.6*   No results for input(s): LIPASE, AMYLASE in the last 168 hours. No results for input(s): AMMONIA in the last 168 hours. Cardiac Enzymes: No results for input(s): CKTOTAL, CKMB, CKMBINDEX, TROPONINI in the last 168 hours. BNP (last 3 results) No results for input(s): BNP in the last 8760 hours. CBG: Recent Labs  Lab 12/03/17 0624 12/03/17 0749 12/04/17 0801 12/05/17 0741 12/06/17 0738  GLUCAP 105* 104* 136* 88 86   Time spent: 35 minutes  Signed:  Berle Mull  [facility]  12/07/2017  , 9:57 AM

## 2017-12-07 NOTE — Social Work (Addendum)
Pt has bed at [facility], able to discharge there today whenever MD has completed discharge summary. Summary to be sent to [facility] at [facility] [place]. CSW will arrange transport for pt via [facility].  CSW paged MD, continuing to follow.  Alexander Mt, [place] Work [phone]

## 2017-12-19 DEATH — deceased

## 2017-12-20 ENCOUNTER — Ambulatory Visit: Payer: Self-pay | Admitting: Adult Health

## 2017-12-20 NOTE — Progress Notes (Deleted)
[facility] [address] [place]. [place] [address] [phone]       OFFICE FOLLOW UP NOTE  [name] Date of Birth:    May 23, 1915 Medical Record Number:  [id number]   Reason for Referral:  hospital *** follow up  CHIEF COMPLAINT:  No chief complaint on file.   HPI: Mario May is being seen today for initial visit in the office for *** on ***. History obtained from *** and chart review. Reviewed all radiology images and labs personally.  Mario May a 84 y.o.malewith PMH of A. fib, HTN, anemia, memory problems, and hypothyroidism who was at a GI appointment on 10/28/2017 about bloatingwhen he developed acute onsetfacial weakness and aphasia.He actually improved en route and subsequently re-worsened.Initially, he was not given IV tPA due to mild symptoms, but when he worsened within the window he was treated with TPA with good results. CTA head/neck showed no high-grade stenosis or occlusion of the right vertebral artery at the C1-2 level and high-grade stenosis of the right vertebral artery at the dural margin.  Repeat head CT was stable to admission head CT without hemorrhage or visible infarct.  Unable to perform MRI due to claustrophobia.  2D echo showed EF of 55-60% and no cardiac source of embolus was identified.  LDL 130 and A1c 5.9.  It was recommended patient start Pravachol 20 mg daily and continue taking aspirin 325 mg daily.  Patient was not previously anticoagulated for A. fib and was deferred to cardiology to decide planned for [facility] therapy.  PT/OT recommend SNF placement.     ROS:   14 system review of systems performed and negative with exception of ***  PMH:  Past Medical History:  Diagnosis Date  . Anemia   . Arthritis    "just my legs" (04/06/2013)  . Assistance needed for mobility    walks with walker  . Atrial fibrillation ([place])   . Constipation   . Gout   . HOH (hard of hearing)   . Hyperlipidemia   .  Hypertension   . Hypothyroidism   . Kidney stones    "only once" (04/06/2013)  . Lumbar foraminal stenosis 06/04/2016  . Pancreatic mass   . 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 Rheumatism   . SIRS (systemic inflammatory response syndrome) ([place]) 02/2013   Mario May 03/19/2011  (04/06/2013)  . Small bowel obstruction ([facility])     PSH:  Past Surgical History:  Procedure Laterality Date  . CYSTOSCOPY W/ STONE MANIPULATION  ?1986  . ESOPHAGOGASTRODUODENOSCOPY N/A 12/06/2017   Procedure: ESOPHAGOGASTRODUODENOSCOPY (EGD);  Surgeon: Doran Stabler, MD;  Location: [place];  Service: Gastroenterology;  Laterality: N/A;  . NASAL FRACTURE SURGERY    . TRANSURETHRAL RESECTION OF PROSTATE      Social History:  Social History   Socioeconomic History  . Marital status: Married    Spouse name: Not on file  . Number of children: 2  . Years of education: Not on file  . Highest education level: Not on file  Occupational History  . Not on file  Social Needs  . Financial resource strain: Not on file  . Food insecurity:    Worry: Not on file    Inability: Not on file  . Transportation needs:    Medical: Not on file    Non-medical: Not on file  Tobacco Use  . Smoking status: Former Smoker    Packs/day: 0.50    Types: Cigarettes    Last attempt  to quit: 1960    Years since quitting: 59.2  . Smokeless tobacco: Never Used  Substance and Sexual Activity  . Alcohol use: No    Comment: 04/06/2013 "last time I've had any alcohol was 09/12/1945; never had a problem w/it"  . Drug use: No  . Sexual activity: Not on file  Lifestyle  . Physical activity:    Days per week: Not on file    Minutes per session: Not on file  . Stress: Not on file  Relationships  . Social connections:    Talks on phone: Not on file    Gets together: Not on file    Attends religious service: Not on file    Active member of club or organization: Not on file    Attends meetings of clubs or  organizations: Not on file    Relationship status: Not on file  . Intimate partner violence:    Fear of current or ex partner: Not on file    Emotionally abused: Not on file    Physically abused: Not on file    Forced sexual activity: Not on file  Other Topics Concern  . Not on file  Social History Narrative   Patient is married he has 2 children he is retired   No alcohol tobacco or drug use    Family History:  Family History  Problem Relation Age of Onset  . Colon cancer Mother   . Prostate cancer Father   . Heart disease Father   . Colon polyps Brother        x 2  . Heart disease Brother     Medications:   No current outpatient medications on file prior to visit.   No current facility-administered medications on file prior to visit.     Allergies:   Allergies  Allergen Reactions  . Codeine Other (See Comments)    Passes out  . Grapefruit Concentrate Other (See Comments)    unknown  . Other Other (See Comments)    Potatoes, green beans, corn - unknown  . Pineapple Other (See Comments)    unknown     Physical Exam  There were no vitals filed for this visit. There is no height or weight on file to calculate BMI. No exam data present  General: well developed, well nourished, seated, in no evident distress Head: head normocephalic and atraumatic.   Neck: supple with no carotid or supraclavicular bruits Cardiovascular: regular rate and rhythm, no murmurs Musculoskeletal: no deformity Skin:  no rash/petichiae Vascular:  Normal pulses all extremities  Neurologic Exam Mental Status: Awake and fully alert. Oriented to place and time. Recent and remote memory intact. Attention span, concentration and fund of knowledge appropriate. Mood and affect appropriate.  No flowsheet data found. Cranial Nerves: Fundoscopic exam reveals sharp disc margins. Pupils equal, briskly reactive to light. Extraocular movements full without nystagmus. Visual fields full to  confrontation. Hearing intact. Facial sensation intact. Face, tongue, palate moves normally and symmetrically.  Motor: Normal bulk and tone. Normal strength in all tested extremity muscles. Sensory.: intact to touch , pinprick , position and vibratory sensation.  Coordination: Rapid alternating movements normal in all extremities. Finger-to-nose and heel-to-shin performed accurately bilaterally. Gait and Station: Arises from chair without difficulty. Stance is normal. Gait demonstrates normal stride length and balance . Able to heel, toe and tandem walk without difficulty.  Reflexes: 1+ and symmetric. Toes downgoing.    NIHSS  *** Modified Rankin  *** HAS-BLED *** CHA2DS2-VASc ***  Diagnostic Data (Labs, Imaging, Testing)   ASSESSMENT: Mario May is a 84 y.o. year old male here with *** on *** secondary to ***. Vascular risk factors include ***.     PLAN: -Continue {anticoagulants:31417}  and ***  for secondary stroke prevention -F/u with PCP regarding your *** management -continue to monitor BP at home  -Maintain strict control of hypertension with blood pressure goal below 130/90, diabetes with hemoglobin A1c goal below 6.5% and cholesterol with LDL cholesterol (bad cholesterol) goal below 70 mg/dL. I also advised the patient to eat a healthy diet with plenty of whole grains, cereals, fruits and vegetables, exercise regularly and maintain ideal body weight.  Follow up in *** or call earlier if needed   Greater than 50% time during this *** minute consultation visit was spent on counseling and coordination of care about ***, and ***, discussion about risk benefit of anticoagulation and answering questions.     Venancio Poisson, AGNP-BC  [facility] [address] [place], [place] [address]  Phone [phone] Fax [phone]

## 2017-12-21 ENCOUNTER — Telehealth: Payer: Self-pay

## 2017-12-21 NOTE — Telephone Encounter (Signed)
Patient no show for appt on 12/20/2017.

## 2018-01-23 ENCOUNTER — Other Ambulatory Visit: Payer: Self-pay

## 2018-02-01 ENCOUNTER — Other Ambulatory Visit: Payer: Self-pay

## 2019-12-30 IMAGING — CT CT ANGIO HEAD
3 of 7 series · 10 of 36 positions shown · IV contrast (APPLIED)
Comparison: CT head without contrast from the same day. CTA head
and neck 01/23/2017.

CLINICAL DATA: Focal neuro deficit greater than 6 hours, stroke
suspected. The patient initially had aphasia which improved. It then
became worse. TPA was then given. There is also right-sided facial
droop and right-sided weakness.

EXAM:
CT ANGIOGRAPHY HEAD AND NECK
TECHNIQUE: Multidetector CT imaging of the head and neck was performed using
the standard protocol during bolus administration of intravenous
contrast. Multiplanar CT image reconstructions and MIPs were
obtained to evaluate the vascular anatomy. Carotid stenosis
measurements (when applicable) are obtained utilizing NASCET
criteria, using the distal internal carotid diameter as the
denominator.
CONTRAST:  50 mL Isovue 370

[Series 3: cta neck/head · axial · 0.49mm/px · z∈[-227,-95]mm · 2 of 194 slices shown]
[im 65/194  soft-tissue]
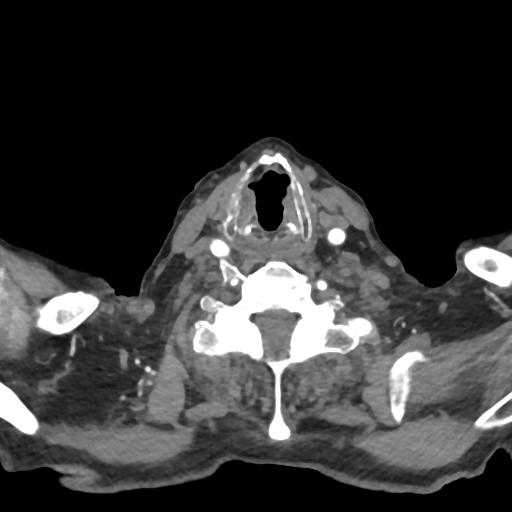
[im 129/194  soft-tissue]
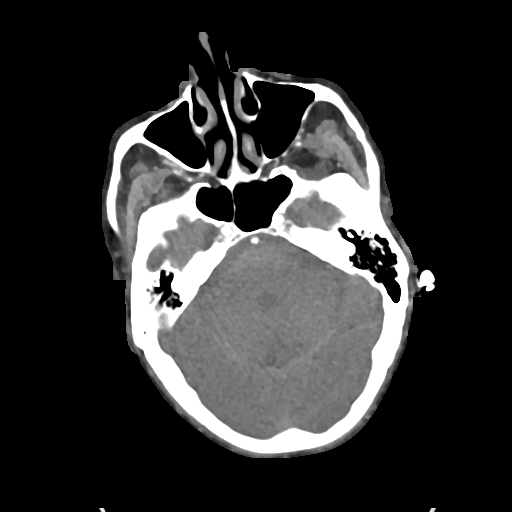

[Series 5: ax thins · axial · 0.39mm/px · z∈[-301,-23]mm · 6 of 390 slices shown]
[im 56/390  soft-tissue]
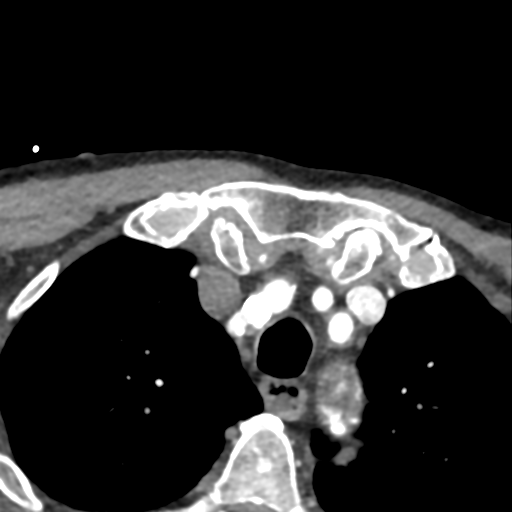
[im 112/390  bone]
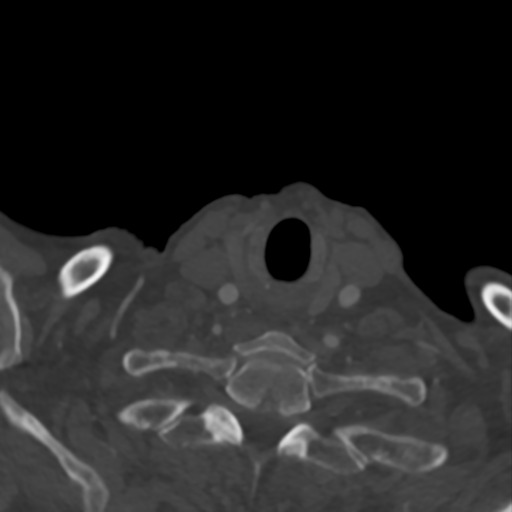
[im 167/390  soft-tissue]
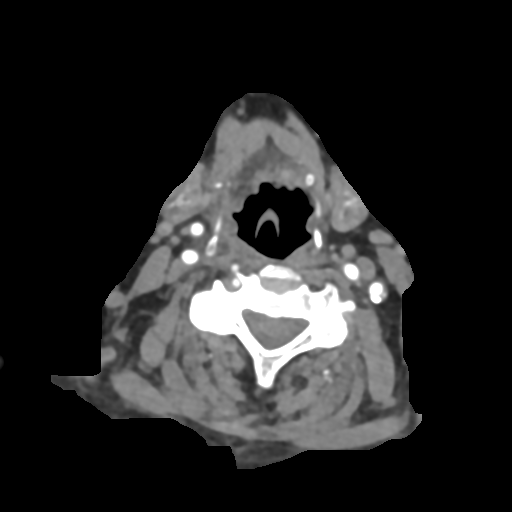
[im 223/390  bone]
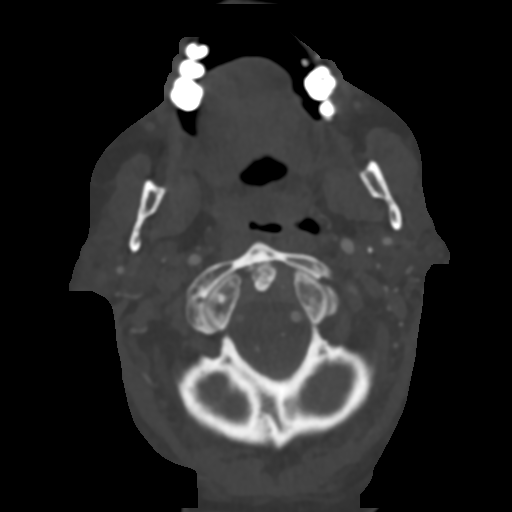
[im 278/390  soft-tissue]
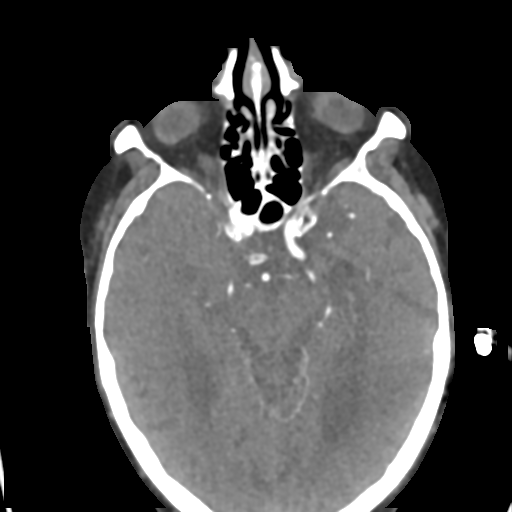
[im 334/390  bone]
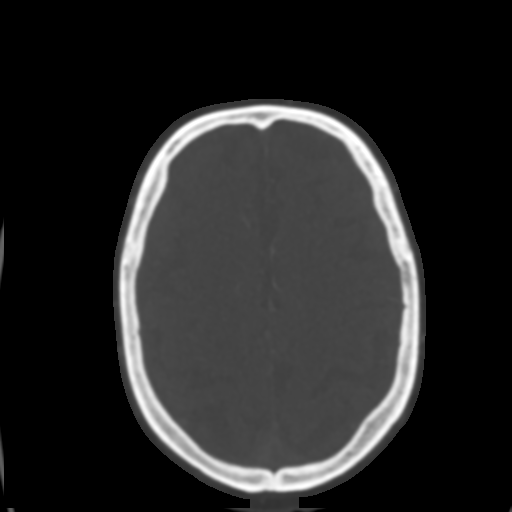

[Series 7: sag thins · sagittal · 0.51mm/px · 2 of 201 slices shown]
[im 46/201  soft-tissue]
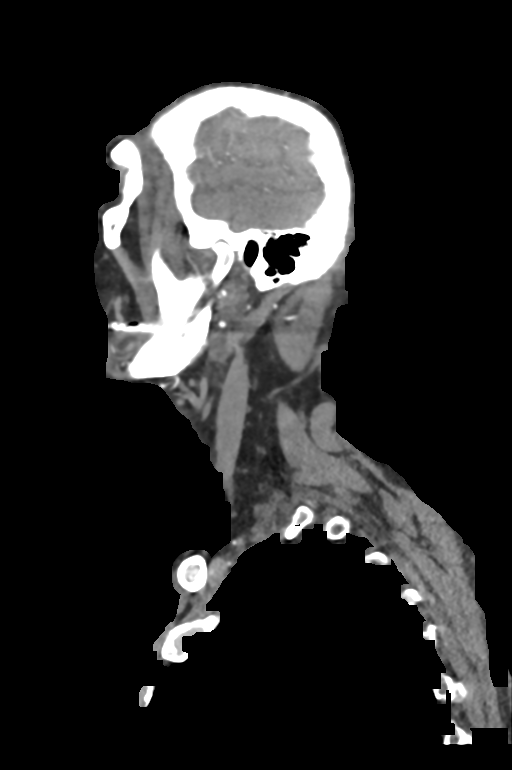
[im 156/201  soft-tissue]
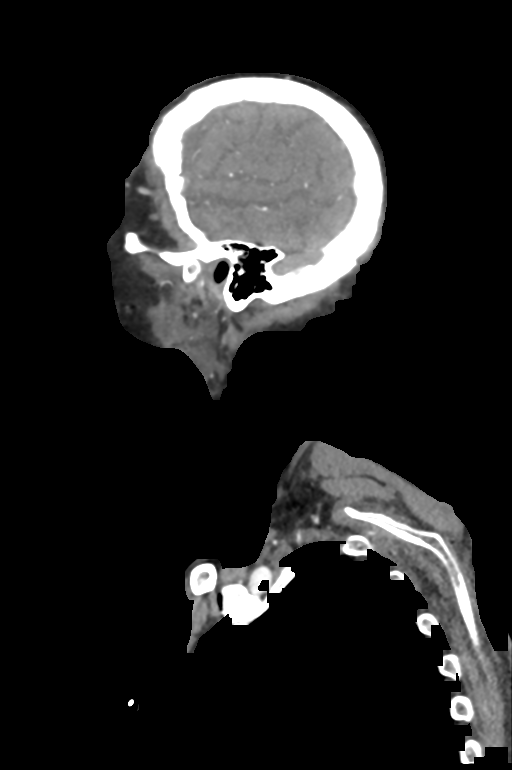

[10 of 36 positions shown; findings below may reference images not displayed]

FINDINGS: A 3 vessel arch configuration is present.

CTA NECK FINDINGS

Aortic arch: A 3 vessel arch configuration is present.
Atherosclerotic calcifications at the aortic arch are stable. There
is no significant stenosis or aneurysm.

Right carotid system: The right common carotid artery is within
normal limits. Atherosclerotic changes at the right carotid
bifurcation are stable. There is no significant stenosis relative to
the more distal vessel. Mild tortuosity is present without
significant stenosis.

Left carotid system: The left common carotid artery is within normal
limits. Atherosclerotic calcifications are again noted at the
carotid bifurcation. There is noncalcified plaque as well.
Atherosclerotic disease is more profound than on the right. There is
no significant stenosis relative to the more distal vessel. Cervical
left ICA is otherwise normal.

Vertebral arteries: The left vertebral artery is the dominant
vessel. Both vertebral arteries originate from the subclavian
arteries. There is a moderate proximal stenosis of the non dominant
right vertebral artery. There is mild irregularity throughout the
right vertebral. The right vertebral artery is occluded at the C1-2
level. Contrast above this level is likely retrograde.

Skeleton: Stable. Osseous foraminal narrowing is present multiple
levels, greatest at C4-5. No focal lytic or blastic lesions are
present. No acute or healing fractures are present.

Other neck: The soft tissues of the neck are otherwise unremarkable.
No significant adenopathy is present. Salivary glands are within
normal limits. Thyroid is unremarkable.

Upper chest: Mild dependent atelectasis is present. The thoracic
inlet is within normal limits. The upper mediastinum is
unremarkable.

Review of the MIP images confirms the above findings

CTA HEAD FINDINGS

Anterior circulation: Cavernous carotid atherosclerotic
calcifications are present bilaterally without a significant
stenosis. The ICA terminus is normal bilaterally. The A1 and M1
segments are normal. Anterior communicating artery is patent.
Moderate diffuse small vessel irregularity is present without a
significant proximal occlusion or stenosis. There is no significant
interval change.

Posterior circulation: Previously noted high-grade stenosis of the
intracranial right vertebral artery is again seen. There is near
complete occlusion now at the dural margin. The left vertebral
artery is unchanged without a significant stenosis. Mild
atherosclerotic changes are present. The proximal basilar artery is
normal. The right AICA and PICA vessels opacify. Both posterior
cerebral arteries originate from the basilar tip. A right posterior
communicating artery is present. Moderate narrowing of distal PCA
branch vessels is stable.

Venous sinuses: The dural sinuses are patent. Straight sinus and
deep cerebral veins are intact. Cortical veins are unremarkable.

Anatomic variants: None.

Delayed phase: Not performed in the emergent setting.

Review of the MIP images confirms the above findings
IMPRESSION: 1. New high-grade stenosis or occlusion of the right vertebral
artery at the C1-2 level.
2. High-grade stenosis of the right vertebral artery at the dural
margin.
3. Extensive distal small vessel disease is otherwise stable.
4. No new significant proximal stenosis or occlusion in the anterior
circulation.
5. Atherosclerotic disease at the aortic arch and bilateral carotid
bifurcations is stable.

The above was relayed via text pager to Dr. ADILSON MARIO ALELAF on

## 2019-12-30 IMAGING — CT CT HEAD W/O CM
1 series · 1 of 2 positions shown · non-contrast
Comparison: Head CT and CTA from earlier today.

CLINICAL DATA: Headache after tPA.  Evaluate for hemorrhage.

EXAM:
CT HEAD WITHOUT CONTRAST
TECHNIQUE: Contiguous axial images were obtained from the base of the skull
through the vertex without intravenous contrast.

[Series 2: topogram 0.6 t20f · coronal · 1.00mm/px · 1 of 2 slices shown]
[im 2/2]
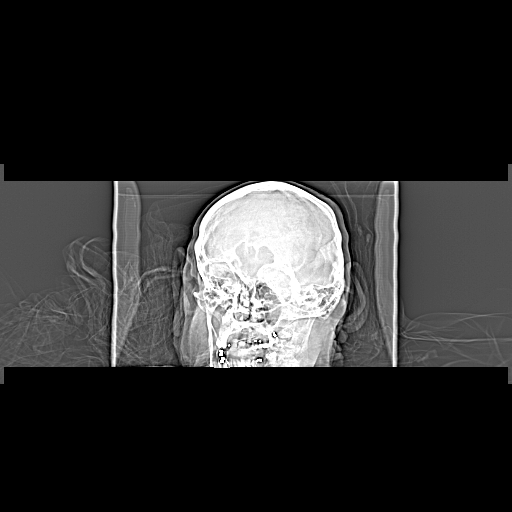

[1 of 2 positions shown; findings below may reference images not displayed]

FINDINGS: Severely motion degraded. No gross hemorrhage. Only the largest
infarct could be confidently visualized. No hydrocephalus or visible
shift. There is atrophy and chronic white matter disease.

Grossly negative bones and soft tissues.
IMPRESSION: Severely motion degraded.  No gross hemorrhage or change from prior.
